# Patient Record
Sex: Female | Born: 1960 | Race: Black or African American | Hispanic: No | Smoking: Former smoker
Health system: Southern US, Community
[De-identification: ages and names within clinical notes are randomized; demographics above are authoritative.]

## PROBLEM LIST (undated history)

## (undated) DIAGNOSIS — R2231 Localized swelling, mass and lump, right upper limb: Secondary | ICD-10-CM

## (undated) DIAGNOSIS — M549 Dorsalgia, unspecified: Secondary | ICD-10-CM

## (undated) DIAGNOSIS — G4730 Sleep apnea, unspecified: Secondary | ICD-10-CM

## (undated) DIAGNOSIS — F329 Major depressive disorder, single episode, unspecified: Secondary | ICD-10-CM

## (undated) DIAGNOSIS — M722 Plantar fascial fibromatosis: Secondary | ICD-10-CM

## (undated) DIAGNOSIS — F32A Depression, unspecified: Secondary | ICD-10-CM

## (undated) DIAGNOSIS — M542 Cervicalgia: Secondary | ICD-10-CM

## (undated) DIAGNOSIS — F419 Anxiety disorder, unspecified: Secondary | ICD-10-CM

## (undated) DIAGNOSIS — I10 Essential (primary) hypertension: Secondary | ICD-10-CM

## (undated) DIAGNOSIS — M25569 Pain in unspecified knee: Secondary | ICD-10-CM

## (undated) DIAGNOSIS — R102 Pelvic and perineal pain: Secondary | ICD-10-CM

## (undated) DIAGNOSIS — N979 Female infertility, unspecified: Secondary | ICD-10-CM

## (undated) DIAGNOSIS — D219 Benign neoplasm of connective and other soft tissue, unspecified: Secondary | ICD-10-CM

## (undated) DIAGNOSIS — M1990 Unspecified osteoarthritis, unspecified site: Secondary | ICD-10-CM

## (undated) HISTORY — DX: Anxiety disorder, unspecified: F41.9

## (undated) HISTORY — DX: Unspecified osteoarthritis, unspecified site: M19.90

## (undated) HISTORY — DX: Benign neoplasm of connective and other soft tissue, unspecified: D21.9

## (undated) HISTORY — PX: HERNIA REPAIR: SHX51

## (undated) HISTORY — DX: Pelvic and perineal pain: R10.2

## (undated) HISTORY — DX: Cervicalgia: M54.2

## (undated) HISTORY — PX: LAPAROSCOPIC LYSIS OF ADHESIONS: SHX5905

## (undated) HISTORY — DX: Sleep apnea, unspecified: G47.30

## (undated) HISTORY — DX: Dorsalgia, unspecified: M54.9

## (undated) HISTORY — PX: ABDOMINAL HYSTERECTOMY: SHX81

## (undated) HISTORY — PX: BREAST SURGERY: SHX581

## (undated) HISTORY — DX: Female infertility, unspecified: N97.9

## (undated) HISTORY — PX: INGUINAL HERNIA REPAIR: SHX194

## (undated) HISTORY — DX: Pain in unspecified knee: M25.569

## (undated) HISTORY — PX: EYE SURGERY: SHX253

---
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
## 1999-03-03 ENCOUNTER — Encounter: Admission: RE | Admit: 1999-03-03 | Discharge: 1999-03-03 | Payer: Self-pay | Admitting: Internal Medicine

## 1999-03-03 ENCOUNTER — Encounter: Payer: Self-pay | Admitting: Internal Medicine

## 1999-12-05 ENCOUNTER — Encounter: Payer: Self-pay | Admitting: Emergency Medicine

## 1999-12-05 ENCOUNTER — Emergency Department (HOSPITAL_COMMUNITY): Admission: EM | Admit: 1999-12-05 | Discharge: 1999-12-05 | Payer: Self-pay | Admitting: Emergency Medicine

## 2001-01-18 ENCOUNTER — Other Ambulatory Visit: Admission: RE | Admit: 2001-01-18 | Discharge: 2001-01-18 | Payer: Self-pay | Admitting: Obstetrics and Gynecology

## 2001-02-01 ENCOUNTER — Ambulatory Visit (HOSPITAL_COMMUNITY): Admission: RE | Admit: 2001-02-01 | Discharge: 2001-02-01 | Payer: Self-pay | Admitting: Internal Medicine

## 2001-02-01 ENCOUNTER — Encounter: Payer: Self-pay | Admitting: Internal Medicine

## 2001-02-21 ENCOUNTER — Encounter: Payer: Self-pay | Admitting: Obstetrics and Gynecology

## 2001-02-21 ENCOUNTER — Encounter: Admission: RE | Admit: 2001-02-21 | Discharge: 2001-02-21 | Payer: Self-pay | Admitting: Obstetrics and Gynecology

## 2001-03-23 ENCOUNTER — Ambulatory Visit: Admission: RE | Admit: 2001-03-23 | Discharge: 2001-03-23 | Payer: Self-pay | Admitting: Gynecology

## 2001-04-15 ENCOUNTER — Encounter (INDEPENDENT_AMBULATORY_CARE_PROVIDER_SITE_OTHER): Payer: Self-pay | Admitting: *Deleted

## 2001-04-15 ENCOUNTER — Ambulatory Visit (HOSPITAL_COMMUNITY): Admission: RE | Admit: 2001-04-15 | Discharge: 2001-04-15 | Payer: Self-pay | Admitting: Obstetrics and Gynecology

## 2001-04-15 HISTORY — PX: CHROMOPERTUBATION: SHX6288

## 2001-04-15 HISTORY — PX: EXCISION OF ENDOMETRIOMA: SHX6473

## 2002-07-19 ENCOUNTER — Other Ambulatory Visit: Admission: RE | Admit: 2002-07-19 | Discharge: 2002-07-19 | Payer: Self-pay | Admitting: Obstetrics and Gynecology

## 2003-04-07 HISTORY — PX: BREAST EXCISIONAL BIOPSY: SUR124

## 2003-07-11 ENCOUNTER — Other Ambulatory Visit: Admission: RE | Admit: 2003-07-11 | Discharge: 2003-07-11 | Payer: Self-pay | Admitting: Obstetrics and Gynecology

## 2003-07-20 ENCOUNTER — Encounter: Admission: RE | Admit: 2003-07-20 | Discharge: 2003-07-20 | Payer: Self-pay | Admitting: Obstetrics and Gynecology

## 2003-07-24 ENCOUNTER — Encounter (INDEPENDENT_AMBULATORY_CARE_PROVIDER_SITE_OTHER): Payer: Self-pay | Admitting: Specialist

## 2003-07-24 ENCOUNTER — Encounter: Admission: RE | Admit: 2003-07-24 | Discharge: 2003-07-24 | Payer: Self-pay | Admitting: Obstetrics and Gynecology

## 2003-08-02 ENCOUNTER — Encounter: Admission: RE | Admit: 2003-08-02 | Discharge: 2003-08-02 | Payer: Self-pay | Admitting: General Surgery

## 2003-08-21 ENCOUNTER — Encounter: Admission: RE | Admit: 2003-08-21 | Discharge: 2003-08-21 | Payer: Self-pay | Admitting: General Surgery

## 2003-08-23 ENCOUNTER — Encounter (INDEPENDENT_AMBULATORY_CARE_PROVIDER_SITE_OTHER): Payer: Self-pay | Admitting: Specialist

## 2003-08-23 ENCOUNTER — Ambulatory Visit (HOSPITAL_BASED_OUTPATIENT_CLINIC_OR_DEPARTMENT_OTHER): Admission: RE | Admit: 2003-08-23 | Discharge: 2003-08-23 | Payer: Self-pay | Admitting: General Surgery

## 2003-08-23 ENCOUNTER — Ambulatory Visit (HOSPITAL_COMMUNITY): Admission: RE | Admit: 2003-08-23 | Discharge: 2003-08-23 | Payer: Self-pay | Admitting: General Surgery

## 2003-08-23 HISTORY — PX: BREAST MASS EXCISION: SHX1267

## 2004-07-30 ENCOUNTER — Encounter: Admission: RE | Admit: 2004-07-30 | Discharge: 2004-07-30 | Payer: Self-pay | Admitting: Internal Medicine

## 2004-08-29 ENCOUNTER — Other Ambulatory Visit: Admission: RE | Admit: 2004-08-29 | Discharge: 2004-08-29 | Payer: Self-pay | Admitting: Obstetrics and Gynecology

## 2005-10-19 ENCOUNTER — Encounter: Admission: RE | Admit: 2005-10-19 | Discharge: 2005-10-19 | Payer: Self-pay | Admitting: Obstetrics and Gynecology

## 2005-10-22 ENCOUNTER — Other Ambulatory Visit: Admission: RE | Admit: 2005-10-22 | Discharge: 2005-10-22 | Payer: Self-pay | Admitting: Obstetrics and Gynecology

## 2005-10-29 ENCOUNTER — Encounter: Admission: RE | Admit: 2005-10-29 | Discharge: 2005-10-29 | Payer: Self-pay | Admitting: Obstetrics and Gynecology

## 2006-11-22 ENCOUNTER — Encounter: Admission: RE | Admit: 2006-11-22 | Discharge: 2006-11-22 | Payer: Self-pay | Admitting: Internal Medicine

## 2006-12-14 ENCOUNTER — Emergency Department (HOSPITAL_COMMUNITY): Admission: EM | Admit: 2006-12-14 | Discharge: 2006-12-14 | Payer: Self-pay | Admitting: *Deleted

## 2007-01-06 ENCOUNTER — Encounter: Admission: RE | Admit: 2007-01-06 | Discharge: 2007-01-06 | Payer: Self-pay | Admitting: Internal Medicine

## 2007-01-06 ENCOUNTER — Inpatient Hospital Stay (HOSPITAL_COMMUNITY): Admission: AD | Admit: 2007-01-06 | Discharge: 2007-01-07 | Payer: Self-pay | Admitting: Obstetrics and Gynecology

## 2007-04-14 ENCOUNTER — Emergency Department (HOSPITAL_COMMUNITY): Admission: EM | Admit: 2007-04-14 | Discharge: 2007-04-14 | Payer: Self-pay | Admitting: Family Medicine

## 2007-05-24 ENCOUNTER — Ambulatory Visit (HOSPITAL_COMMUNITY): Admission: RE | Admit: 2007-05-24 | Discharge: 2007-05-25 | Payer: Self-pay | Admitting: Obstetrics and Gynecology

## 2007-05-24 ENCOUNTER — Encounter (INDEPENDENT_AMBULATORY_CARE_PROVIDER_SITE_OTHER): Payer: Self-pay | Admitting: Obstetrics and Gynecology

## 2007-05-24 HISTORY — PX: ROBOTIC ASSISTED TOTAL HYSTERECTOMY WITH BILATERAL SALPINGO OOPHERECTOMY: SHX6086

## 2007-06-18 ENCOUNTER — Ambulatory Visit (HOSPITAL_COMMUNITY): Admission: AD | Admit: 2007-06-18 | Discharge: 2007-06-18 | Payer: Self-pay | Admitting: Obstetrics and Gynecology

## 2007-06-18 HISTORY — PX: REPAIR VAGINAL CUFF: SHX6067

## 2007-11-24 ENCOUNTER — Ambulatory Visit (HOSPITAL_COMMUNITY): Admission: RE | Admit: 2007-11-24 | Discharge: 2007-11-24 | Payer: Self-pay | Admitting: Internal Medicine

## 2008-08-08 ENCOUNTER — Emergency Department (HOSPITAL_COMMUNITY): Admission: EM | Admit: 2008-08-08 | Discharge: 2008-08-08 | Payer: Self-pay | Admitting: Emergency Medicine

## 2008-11-27 ENCOUNTER — Ambulatory Visit (HOSPITAL_COMMUNITY): Admission: RE | Admit: 2008-11-27 | Discharge: 2008-11-27 | Payer: Self-pay | Admitting: Internal Medicine

## 2009-11-29 ENCOUNTER — Ambulatory Visit (HOSPITAL_COMMUNITY): Admission: RE | Admit: 2009-11-29 | Discharge: 2009-11-29 | Payer: Self-pay | Admitting: Obstetrics and Gynecology

## 2010-07-15 LAB — POCT URINALYSIS DIP (DEVICE)
Bilirubin Urine: NEGATIVE
Glucose, UA: NEGATIVE mg/dL
Ketones, ur: NEGATIVE mg/dL
Nitrite: NEGATIVE
Protein, ur: NEGATIVE mg/dL
Specific Gravity, Urine: 1.01 (ref 1.005–1.030)
Urobilinogen, UA: 0.2 mg/dL (ref 0.0–1.0)
pH: 5.5 (ref 5.0–8.0)

## 2010-08-19 NOTE — Op Note (Signed)
[name], Christine Eaton              ACCOUNT NO.:  [id number]   MEDICAL RECORD NO.:  [id number]          PATIENT TYPE:  AMB   LOCATION:  MATC                          FACILITY:  [facility]   PHYSICIAN:  Hal Morales, M.D.DATE OF BIRTH:  09-26-1960   DATE OF PROCEDURE:  06/18/2007  DATE OF DISCHARGE:                               OPERATIVE REPORT   PREOPERATIVE DIAGNOSIS:  Vaginal bleeding status post robotic-assisted  hysterectomy, bilateral salpingo-oophorectomy, and lysis of adhesions on  May 24, 2007, hemodynamic instability.   POSTOPERATIVE DIAGNOSIS:  Vaginal mucosal cuff separation.   PROCEDURE:  Repair of vaginal cuff separation.   SURGEON:  Hal Morales, M.D.   ANESTHESIA:  General orotracheal.   ESTIMATED BLOOD LOSS:  Less than 10 mL at surgery; however, the patient  had lost approximately 500 mL of blood as a result of her bleeding prior  to reaching the operating room.   SPECIMENS TO PATHOLOGY:  None.   FINDINGS:  The vaginal mucosa had separated at the apex of the vaginal  cuff, leaving a row of Vicryl sutures in the submucosal tissue but  having the vaginal mucosa completely separated the anterior from  posterior.  In the left angle of the vaginal cuff, there were 3 distinct  areas of vascular bleeding.   PROCEDURE:  The patient was taken to the operating room after  appropriate identification and after an unsuccessful attempt at complete  examination without anesthesia.  She was placed on the operating table,  and after the attainment of adequate general anesthesia was placed in  the lithotomy position.  The perineum and vagina were prepped with  multiple layers of Betadine and draped as a sterile field.  A Foley  catheter was inserted into the bladder and connected to straight  drainage.   A weighted speculum was placed in the posterior vagina and sidewall  retractors used to retract the sidewall.  A Deaver was used to retract  the bladder.  The  above-noted findings were made.  The vaginal cuff  edges were noted to be smooth and nonnecrotic.  The vaginal cuff was  then reapproximated with sutures of 0 Vicryl in a figure-of-eight  fashion, allowing for excellent hemostasis at the end of the procedure.  All instruments were then removed from the vagina.   The patient was awakened from general anesthesia and taken to the  recovery room in satisfactory condition, having tolerated the procedure  well, with sponge and instrument counts correct.   DISCHARGE INSTRUCTIONS:  Printed instructions from the [facility]  of [place] modified for repair of vaginal cuff.   DISCHARGE MEDICATIONS:  The patient's preoperative medications which  included amoxicillin 500 mg t.i.d. to complete 10 days, triamterene HCTZ  37.5/25, and ibuprofen 600 mg p.o. q.6h. p.r.n. pain.   FOLLOW UP:  The patient is to follow up with Dr. Estanislado Pandy in 2 weeks as  scheduled.  She is to call for any further heavy vaginal bleeding,  severe abdominal pain or cramps, elevation of temperature to greater  than 101.  She is cautioned that she may have some vaginal spotting but  that she should not have any vaginal bleeding that is as heavy as her  formal menstrual periods.  She will be observed in the postanesthesia  care unit, and if she has no orthostatic symptoms will be allowed to be  discharged home.      Hal Morales, M.D.  Electronically Signed     VPH/MEDQ  D:  06/18/2007  T:  06/19/2007  Job:  [id number]   cc:   Dois Davenport A. Rivard, M.D.  Fax: [phone]

## 2010-08-19 NOTE — Op Note (Signed)
NAMEEMMALISE, Christine Eaton              ACCOUNT NO.:  [id number]   MEDICAL RECORD NO.:  [id number]          PATIENT TYPE:  OIB   LOCATION:  1537                         FACILITY:  [facility]   PHYSICIAN:  Crist Fat. Rivard, M.D. DATE OF BIRTH:  1960-09-25   DATE OF PROCEDURE:  05/24/2007  DATE OF DISCHARGE:                               OPERATIVE REPORT   PREOPERATIVE DIAGNOSES:  1. Chronic pelvic pain.  2. Stage IV endometriosis.  3. Right ovarian fibroma.  4. Uterine fibroids.  5. Right ovarian endometrioma.  6. Dense pelvic adhesions with frozen pelvis.   ANESTHESIA:  General, Jenelle Mages. Rica Mast, M.D.   PROCEDURE:  Robotic-assisted total hysterectomy with bilateral salpingo-  oophorectomy and lysis of adhesions.   SURGEON:  Crist Fat. Rivard, M.D.   ASSISTANTBritt Bottom C. Lowell Guitar, M.D.   ESTIMATED BLOOD LOSS:  200 mL.   PROCEDURE:  After being informed of the planned procedure with possible  complications including bleeding, infection, injury to bowels, bladder  or ureters, need for laparotomy, need to proceed with a supracervical  hysterectomy instead of a total hysterectomy, informed consent is  obtained.  The patient is taken to OR #10, given general anesthesia with  endotracheal intubation without complication.  She is placed in a  lithotomy position on a sticky mattress, arms padded and tucked on each  side, chest taped to the table and knee-high sequential compression  devices on.  She is prepped and draped in a sterile fashion.  Her pelvic  exam reveals a midline uterus which appears to be mobile.  The adnexa  are not felt.  A weighted speculum is inserted.  Anterior lip of the  cervix was grasped with a tenaculum forceps and we proceed with cervical  dilatation until Hegar dilator #27.  The uterus is then sounded at 7 cm,  which allows for the easy entry of a #6 RUMI intrauterine manipulator.  This is put in place with the 3-mm co-ring and a vaginal occluder.  The  RUMI  uterine manipulator balloon is inflated with saline 3 mL.   The co-ring is sutured to the cervix with two sutures of 0 Vicryl.   A Foley catheter is then placed.   A decision is made to place our camera trocar 2 cm above the umbilicus,  and this area is infiltrated with Marcaine 0.25% L and we perform a  midline incision on a 10-mm distance.  This was brought down bluntly to  the fascia,which is identified, grasped with Kocher forceps and incised  in a midline fashion.  Peritoneum is entered bluntly.  A pursestring  suture of 0 Vicryl is placed on the fascia and we can easily insert a 10-  mm Hasson trocar held in place by the pursestring suture.  This allows  [place] for easy insufflation of a pneumoperitoneum with CO2 at a maximum  pressure of 15 mmHg.   The camera is inserted for a quick evaluation of the pelvis, which  reveals a normal appendix and a normal liver as well as a normal  gallbladder.  The pelvis is visualized with difficulty.  What  we can see  right now is a large right ovarian pelvic mass with a smooth surface,  from what we can see, compatible with an endometrioma.  We can only see  the fundus of the uterus, which shows two small subserosal fibroids  between 2.5 and 3 cm.  The anterior cul-de-sac is negative.  We try to  manipulate the uterus with the previously-placed RUMI.  It is impossible  for [place] to see more than what is previously described.   We then decide on placement of our other trocars and we place two 8-mm  robotic trocars on the left side after infiltrating with Marcaine 0.25%  and this is performed under direct visualization.  We place a one 8-mm  robotic trocar on the right side as well as a 10-mm patient-side  assistant trocar on the right side under direct visualization after  infiltrating with Marcaine 0.25%.   The robot is now easily docked and the patient is placed in a steep  Trendelenburg position.  Preparation, including docking time, takes 45   minutes.   Console time starts at the 11:35 and once we have placed a Cobra forceps  in arm #3, a PK gyrus forceps in arm #2, and a hot monopolar scissors in  arm #1, with the help of the intrauterine manipulator we can now  visualize the pelvis.  This pelvis is what we would consider a frozen  pelvis since we are unable to mobilize the right ovary, which appears to  be approximately 8 cm in diameter.  It is impossible to visualize the  posterior cul-de-sac with the rectum completely adherent to the midbody  of the posterior aspect of the uterus.  We are unable to visualize the  left adnexa at all.  We are able to visualize the left round ligament  and the right round ligament.  A decision is made to proceed  systematically and the right round ligament is grasped and cauterized  with PK forceps and sectioned.  This allows [place] an entry in the anterior  broad ligament all the way to the anterior cul-de-sac.  We are able to  mobilize the bladder easily. And with the round ligament entry, we can  now open the right pelvic sidewall retroperitoneum and dissect the space  and bluntly and sharply.  The infundibulopelvic ligament is eventually  identified and, luckily, we can see the ureter away from it.  The IP  ligament is then grasped, cauterized with PK forceps and sectioned.  Then we spent the next 90 minutes dissecting the ovary systematically,  keeping the ureter under direct visualization.  Because of the  thickening of the peritoneum and the inflammatory aspect, this  dissection is quite challenging.  Nevertheless, we are able to dissect  the ovary from the pelvic wall about 75% of the way and then we can turn  our attention to the utero-ovarian ligament.  Sharply and bluntly  dissecting the rectum away from the mid body of the uterus, we have  access to the utero-ovarian ligament.  This one is grasped, cauterized  and sectioned, and we proceed systematically until the ovary is   completely freed from the uterus and from the pelvic wall, having kept a  close eye on the ureter.  During this dissection the ovary is ruptured  and chocolaty-aspect fluid is evacuated, typical of endometriosis.  Please note that before the dissection took place, pelvic washings were  obtained.  There is no active bleeding and by dissecting the  bladder  even further down, we are able to locate the uterine artery in its  ascending branch near the co-ring, which is, thankfully, easily  identified in the anterior aspect of the vagina.  We then place our  attention on the left pelvic wall and after we have bluntly and sharply  dissected the rectum away from the mid body of the uterus, we actually  have a very good visualization of the left ureter.  The  infundibulopelvic ligament is easily identified away from the ureter.  It is grasped, cauterized with PK forceps and sectioned.  The round  ligament on the left side is cauterized and sectioned and the  retroperitoneum again is entered.  The broad ligament on the left side  is opened anteriorly and we can complete the dissection of the bladder  away from the vaginal fornix by easily identifying the co-ring.  The  left ovary and left tube are then identified completely adherent to the  pelvic wall and to the uterus but, thankfully, it can be dissected  bluntly by traction-countertraction until it is completely released from  the left pelvic wall.  We do have to put more attention on the posterior  cul-de-sac and dissect the rectum somewhat more downward so that the co-  ring can be easily identified despite the inflammatory nature of the  posterior cul-de-sac, and this is achieved fairly easily with blunt  dissection.  Going back into the anterior aspect of the vaginal fornix,  we can now easily identify on both sides the uterine artery in its  ascending aspect.  With the ureter away from our site, we can then  cauterize both uterine arteries  very safely.  We then insufflate the  vaginal occluder and we enter the vagina in the anterior aspect guided  by the co-ring, and we perform a circumferential incision, freeing the  uterus entirely.  The uterus is then removed completely through the  vagina with its left adnexa, and the right adnexa is also pushed through  the vagina and removed.   At this time bleeding is noted on the right vaginal artery, which is  easily identified, grasped and cauterized.   We irrigate profusely the pelvis and we do not note any active bleeding.  The instruments are then changed for a suture cut in arm #1, a needle  holder in arm #2, and PK forceps in arm #3, and we proceed with closure  of the vagina using figure-of-eight stitches of 0 Vicryl.  Once the  vagina is completely closed, we proceed with systematic and profuse  irrigation of the pelvis, noting a satisfactory hemostasis on the right  pelvic wall with a normal ureter, normal mobility, no dilatation.  On  the left pelvic wall there are a few oozing sites that are controlled  with cauterization after ensuring that the ureter is away from those  sites.  The posterior cul-de-sac at the site of dissection away from the  rectum is oozing on different sides, and this is controlled with  cauterization.  We again irrigate profusely and now note a satisfactory  hemostasis.  We again review the right ureter, which is normal, and the  left ureter, which is normal.  An Interceed is split into lengthwise and  deposited on the left and right pelvic wall as well as the vaginal  fornix.   The robot is undocked.  Trocars are removed under direct sedation after  evacuating pneumoperitoneum.   The fascia of the supraumbilical incision  is closed with the previously-  placed suture of 0 Vicryl.  The fascia of the right 10-mm patient side-  assistant trocar is closed with a figure-of-eight stitch of 0 Vicryl,  and then the skin of all incisions is closed with  subcuticular suture of  3-0 Monocryl and Dermabond.   Instrument and sponge count is complete x2.  Estimated blood loss is 200  mL.  The procedure is very well-tolerated by the patient, who is taken  to recovery room in a well and stable condition.   The specimen is composed of uterus, two tubes, two ovaries and cervix,  weighs 198 g, and is sent to pathology.      Crist Fat Rivard, M.D.  Electronically Signed     SAR/MEDQ  D:  05/24/2007  T:  05/25/2007  Job:  [id number]

## 2010-08-19 NOTE — H&P (Signed)
NAMEJACKILYN, Christine Eaton              ACCOUNT NO.:  [id number]   MEDICAL RECORD NO.:  [id number]          PATIENT TYPE:  AMB   LOCATION:  DAY                          [facility]ACILITY:  [facility]   PHYSICIAN:  Crist Fat. Rivard, M.D. DATE OF BIRTH:  May 20, 1960   DATE OF ADMISSION:  DATE OF DISCHARGE:                              HISTORY & PHYSICAL   HISTORY OF PRESENT ILLNESS:  Christine Eaton is a 50 year old widowed African-  American female para, 0-0-3-0, who presents for A robot assisted  laparoscopic hysterectomy with bilateral salpingo-oophorectomy because  of chronic pelvic pain, stage IV endometriosis, uterine fibroids and a  right ovarian fibroma.  Christine Eaton has a longstanding history of chronic  pelvic pain which she reports has worsened over the past 2 years.  The  patient has a 7-10-day menstrual flow which is characterized by severe  pain which she rates as an 8/10 on a 10-point pain scale and decreases  only to 3/10 on a 10-point pain scale with narcotic analgesia.  She  changes her overnight size pads every 3 hours and occasionally will soil  her clothes.  She denies any urinary tract symptoms, vaginitis symptoms,  changes in her bowel movements or fever.  The patient was seen at [facility] in October of 2008 for severe abdominal pain and  a subsequent pelvic ultrasound showing a complex left ovarian cyst which  was septated, measuring 4.3 cm, and an accompanying hemorrhagic cyst  measuring 3.1 cm.  Additionally, a complex right adnexal mass appearing  lateral to the right ovary measuring 6.6 x 5.5 x 4.9 cm was also  observed.  Accompanying these findings were 3 uterine fibroids with the  largest measuring 3.4 cm.  A subsequent pelvic CT had consistent  findings, except only one fibroid measuring 2.9 cm was observed.  It was  at this time that the patient was placed on 73-month course of Lupron  11.25 mg IM in November of 2008, and fortunately had significant but not  total relief of her pain.  The patient was amenorrheic throughout the  course of her Lupron therapy, with the exception of a 3-day flow in  December of 2008 which she says was very light.  A gonorrhea and  chlamydia culture done during this time was negative.  A complete blood  count was within normal limits, and her CA-125 was elevated at 165.5.  The patient experienced a follow-up ultrasound in February of 2009 which  revealed a uterus measuring 7.49 cm x 5.83 cm x 5.59 cm with 3 fibroids,  the largest of which measured 3.42 cm.  These findings were accompanied  only by a right ovarian fibroma measuring 7.0 cm x 6.4 cm x 5.0 cm.  Given the chronicity and debilitating nature of her symptoms, along with  the relief he achieved with Lupron therapy, the patient has decided to  proceed with definitive therapy for her condition in the form of  hysterectomy with bilateral salpingo-oophorectomy.   PAST MEDICAL HISTORY:   OBSTETRICAL HISTORY:  Gravida 3, para 0-0-3-0.   GYNECOLOGIC HISTORY:  Menarche at 50 years old.  Last menstrual period  was January 18, 2007 due to Lupron therapy.  The patient does have a  history of syphilis and herpes simplex virus #2.  Denies any history of  abnormal Pap smears.  Last normal Pap smear was in 2007.  Last normal  mammogram was in 2008.   MEDICAL HISTORY:  1. Hypertension.  2. Infertility.   SURGICAL HISTORY:  1. 1980 - elective abortion.  2. 1985 - D&C due to a spontaneous abortion.  3. 1989 - hernia repair.  4. 2003 - diagnostic laparoscopy with lysis of adhesions and excision      of an endometrioma (it was at this time the patient was diagnosed      with severe endometriosis).  5. 2005 - right breast biopsy (benign).  (The patient denies any problems with anesthesia or history of blood  transfusions.)   FAMILY HISTORY:  Hypertension, diabetes and stroke.   HABITS:  The patient does not use tobacco.  She occasionally consumes  alcohol.   Denies any use of illicit drugs.   SOCIAL HISTORY:  The patient is widowed and works as a Water quality scientist at  [employer].   CURRENT MEDICATIONS:  1. Calcium 1000 mg daily.  2. Multivitamin 1 tablet daily.  3. Triamterene/HCTZ 37.5/25 mg 1 tablet daily.   ALLERGIES:  She has no known drug allergies.   REVIEW OF SYSTEMS:  The patient denies any chest pain, shortness of  breath, headache, vision changes, and except as is mentioned in history  of present illness, the patient's review of systems is negative.   PHYSICAL EXAMINATION:  VITAL SIGNS:  Blood pressure 112/80.  Weight is  203, height 5 feet 8 inches tall, pulse is 82.  NECK:  Supple without masses.  There is no thyromegaly or cervical  adenopathy.  HEART:  Regular rate and rhythm.  LUNGS:  Clear.  BACK:  No CVA tenderness.  ABDOMEN:  Diffusely tender with no guarding or rebound.  There are no  masses or organomegaly.  EXTREMITIES:  No clubbing, cyanosis or edema.  PELVIC:  EG/BUS is within normal limits.  Vagina is rugous.  Cervix is  nontender without lesions, though it is stenotic.  Cervix appears normal  size, shape and consistency.  It is mobile, anteverted.  Adnexa with no  palpable masses; however, there is right adnexal tenderness.  Rectovaginal reveals no masses.   IMPRESSION:  1. Chronic pelvic pain.  2. Stage IV endometriosis.  3. Uterine fibroids.  4. Right ovarian fibroma.   DISPOSITION:  A discussion was held with the patient regarding the  indications for her procedures along with their risks which include, but  are not limited to, reaction to anesthesia, damage to adjacent organs,  infection, excessive bleeding, increased length of procedure compared  with open abdominal case and transient postoperative facial edema.  The  patient further understands that her length of stay in the hospital is  expected to be 1-2 days and that she should be able to resume her normal  activities within 2-3 weeks.   The patient was given a MiraLax bowel prep  to be  administered 24 hours prior to her surgical procedure.  She has  consented to proceed with a robot assisted laparoscopic hysterectomy  with bilateral salpingo-oophorectomy at [facility]  on May 24, 2007 at 10:30 a.m.      Elmira J. Adline Peals.      Crist Fat Rivard, M.D.  Electronically Signed    EJP/MEDQ  D:  05/21/2007  T:  05/23/2007  Job:  [id number]

## 2010-08-22 NOTE — Op Note (Signed)
NAME:  Christine Eaton, Christine Eaton                        ACCOUNT NO.:  [id number]   MEDICAL RECORD NO.:  [id number]                   PATIENT TYPE:  AMB   LOCATION:  DSC                                  FACILITY:  [facility]   PHYSICIAN:  Leonie Man, M.D.                DATE OF BIRTH:  01-21-1961   DATE OF PROCEDURE:  08/23/2003  DATE OF DISCHARGE:                                 OPERATIVE REPORT   PREOPERATIVE DIAGNOSIS:  Right breast mass.   POSTOPERATIVE DIAGNOSIS:  Right breast mass.  Pathology pending.   OPERATION PERFORMED:  Excisional biopsy, right breast mass.   SURGEON:  Leonie Man, M.D.   ASSISTANT:  Nurse.   ANESTHESIA:  General.   INDICATIONS FOR PROCEDURE:  The patient is a 49 year old woman with a right-  sided breast mass located at 2 o'clock axis of the breast.  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  She comes to  the operating room now for excision of this mass after the risks and  potential benefits of surgery had been discussed, all questions answered and  consent obtained.   DESCRIPTION OF PROCEDURE:  Following the induction of satisfactory general  anesthesia, the patient was positioned supinely and the right breast prepped  and draped routinely.  The mass was palpated in the upper inner quadrant of  the right breast at approximately 2 o'clock axis.  A transverse incision was  made over the mass, deepened through the skin and subcutaneous tissue  carrying the dissection down to the mass and in fact, upon dissection around  this area, there was one somewhat softer mass which was excised from the  surrounding soft tissue and removed and forwarded for pathologic evaluation  as specimen #1.  Right below this was yet another mass which was clinically  a fibroadenoma and this was dissected free from the surrounding tissues and  also forwarded for pathologic evaluation as specimen #2. There were no  additional masses felt within  the breast by manual examination of the breast  tissues.  Hemostasis was assured with electrocautery.  The breast tissues  were reapproximated with interrupted 2-0 Vicryl sutures.  The subcutaneous  tissues closed with interrupted 3-0 Vicryl sutures and the skin closed with  a running 5-0 Monocryl suture.  The wound was then reinforced with Steri-  Strips.  Sterile dressings applied.  Anesthetic was reversed and the patient  removed from the operating room to the recovery room in stable condition.  She tolerated the procedure well.                                               Leonie Man, M.D.    PB/MEDQ  D:  08/23/2003  T:  08/24/2003  Job:  [id number]

## 2010-08-22 NOTE — Consult Note (Signed)
[facility]  Patient:    Christine Eaton, Christine Eaton Visit Number: [id number] MRN: [id number]          Service Type: GON Location: GYN Attending Physician:  Jeannette Corpus Dictated by:   Rande Brunt. Clarke-Pearson, M.D. Proc. Date: 03/23/01 Admit Date:  03/23/2001   CC:         Janine Limbo, M.D.   Consultation Report  REASON FOR CONSULTATION:  A 50 year old married African-American female referred by Dr. Marline Backbone for second opinion regarding management of a newly diagnosed adnexal mass.  The patient presented to Dr. Stefano Gaul in early November, complaining of right lower quadrant pain.  She was found to have a pelvic mass which was typified on an ultrasound as a 6.4 x 4.6 x 5.7 cm ovarian cyst.  At that time, a CA125 was drawn which was 63.  Subsequently, the patient has had a CAT scan which shows no other significant abnormalities except for the right adnexal mass.  A repeat CA125 on March 02, 2001, was 49.5.  The patient has persistent right lower quadrant pain.  She actually reports that this has been going on for 3 to 4 months, and it is also associated with dyspareunia.  From a gynecologic point of view, the patient has regular cyclic menstrual periods.  She has never been pregnant, although she has been trying to get pregnant for the last 7 years.  PAST MEDICAL HISTORY:  Hypertension.  ALLERGIES:  No known drug allergies.  CURRENT MEDICATIONS:  Norvasc.  PAST SURGICAL HISTORY:  Femoral hernia repair in 1989.  FAMILY HISTORY:  Negative for gynecologic, breast, or colon cancer.  REVIEW OF SYSTEMS:  Negative, except as noted in history of present illness.  PHYSICAL EXAMINATION:  VITAL SIGNS:  Height 5 feet 7 inches, weight 191 pounds.  GENERAL:  The patient is a healthy pleasant black female in no acute distress.  HEENT:  Negative.  NECK:  Supple, without thyromegaly.  There is no supraclavicular or  inguinal adenopathy.  ABDOMEN:  Soft, nontender, except for some tenderness to deep palpation of the right lower quadrant.  No masses are noted.  PELVIC:  EGBUS normal.  Vagina clean.  Cervix is normal.  Uterus feels to be retroverted, and there is fullness in a very tender area to the right of the uterus consistent with an adnexal mass.  Rectovaginal examination confirms.  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  The slightly elevated CA125 is most likely secondary to the patient having endometriosis.  I seriously doubt that this is an ovarian malignancy, and would therefore recommend that Dr. Stefano Gaul proceed with surgical management, either by laparotomy or laparoscopy.  I would be happy to be on standby, but reassured the patient and her husband that I thought it unlikely that this was an ovarian cancer, and that it is more likely that she has an endometrioma.Dictated by:   Rande Brunt. Clarke-Pearson, M.D. Attending Physician:  Jeannette Corpus DD:  03/23/01 TD:  03/23/01 Job: [id number]

## 2010-08-22 NOTE — Op Note (Signed)
[facility]  Patient:    Christine Eaton, Christine Eaton Visit Number: [id number] MRN: [id number]          Service Type: DSU Location: [facility] Attending Physician:  Leonard Schwartz Dictated by:   Janine Limbo, M.D. Proc. Date: 04/15/01 Admit Date:  04/15/2001 Discharge Date: 04/15/2001                             Operative Report  PREOPERATIVE DIAGNOSES:       1. A 6.7 cm complex right adnexal mass.                               2. Elevated CA-125 (but decreasing values).                               3. Infertility.  POSTOPERATIVE DIAGNOSES:      1. A 6.7 cm complex right adnexal mass.                               2. Elevated CA-125 (but decreasing values).                               3. Infertility.                               4. Severe endometriosis.                               5. Severe pelvic adhesions.  PROCEDURES:                   1. Diagnostic laparoscopy.                               2. Laparoscopic lysis of adhesions.                               3. Laparoscopic pelvic biopsies.                               4. Chromopertubation.                               5. Laparoscopic evacuation of an endometrioma in                                  the right tube.  SURGEON:                      Janine Limbo, M.D.  ANESTHESIA:                   General.  INDICATIONS:                  The patient is a 50 year old female, para 0-0-3-0, who presents with the above-mentioned diagnoses.  She understands the indications for her surgical procedure and she accepts the risks of (but not limited to) anesthetic complications, bleeding, infection and possible damage to the surrounding organs.  She understands that no guarantees can be given concerning the total relief of her pain.  She understands that it is possible that we may need to perform a salpingo-oophorectomy.  FINDINGS:                     The uterus was upper limits of normal  size. There was severe endometriosis throughout the pelvis and the abdominal cavity. The posterior cul-de-sac had severe endometriosis and adhesions.  There were endometriosis lesions measuring greater than 1 cm in size.  The right fallopian tube was approximately 7 cm in size and it contained fluid and dark brown to red tissue consistent with endometriosis in the tube.  The right ovary was upper limits of normal size and it was severely adhered to the right pelvic side wall, the right fallopian tube, the right fundus of the uterus and the right posterior cul-de-sac.  The left ovary was normal size.  It had dense adhesions between the left pelvic side wall and the left posterior cul-de-sac. The left fallopian tube was surprisingly normal.  The fimbriated end of the left tube was delicate.  On chromopertubation, the tube was noted to fill with dye and dye did spill into the pelvic cavity.  No dye was able to spill fro the right fallopian tube, however.  There were endometriosis lesions present in the anterior cul-de-sac, with the largest measuring approximately 1 cm in size.  The posterior cul-de-sac contained moderate adhesions.  The appendix had multiple endometriosis lesions present on it measuring less than 0.5 cm in size.  The bowel and the omentum had multiple endometriosis lesions on it in the pelvis.  These lesions measured less than 0.5 cm in size.  There was a flesh-appearing lesion measuring 1 cm on the diaphragm to the right of the midline.  This was not consistent with endometriosis.  The liver and the gallbladder appeared normal.  DESCRIPTION OF PROCEDURE:     The patient was taken to the operating room, where a general anesthetic was given.  The patients abdomen, perineum and vagina were prepped with multiple layers of Betadine.  A Foley catheter was placed in the bladder.  The patient was sterilely draped after a Hulka tenaculum was placed inside the uterus.  The  subumbilical area was injected with 5 cc of 0.5% Marcaine.  An incision was made and a Veress needle was inserted into the abdominal cavity without difficulty.  Proper placement was confirmed using the saline drop test.  A pneumoperitoneum was obtained.  The laparoscopic trocar and the laparoscope were then inserted.  The pelvic structures were visualized with findings as mentioned above.  Two suprapubic incisions were made after injecting the areas with a total of 5 cc of 0.5% Marcaine.  Two 5 mm trocars were placed into the lower cavity under direct visualization.  Pictures were taken of the patients pelvic anatomy.  The pelvis was irrigated.  Lysis of adhesions was accomplished in the right adnexa, though we were not able to totally remove all of the adhesions and the right ovary remained stuck, even after lysis of adhesions.  We did open the right fallopian tube and drain the endometrioma that was present.  Biopsies were obtained from lesions in the pelvis.  We carefully considered what we were likely to accomplish with our  operative procedure, and the decision was made to terminate the procedure because we felt that we were not able to adequately lyse the adhesions because of the severity of the disease.  We did biopsy the lesion on the diaphragm.  Chromopertubation was performed and dye was noted to quickly fill and spill from the left tube.  Dye did not spill fro the right tube.  The pelvis was again irrigated.  Hemostasis was noted to be adequate.  The decision was made to terminate the procedure at this point. The bowel was carefully inspected and there was no evidence of trocar damage. The pneumoperitoneum was allowed to escape.  All instruments were removed. The incisions were closed using deep and superficial sutures of 4-0 Vicryl. Sponge, needle and instrument counts were correct.  Estimated blood loss was 10 cc.  The patient tolerated her procedure well.  She was taken to  the recovery room in stable condition.  FOLLOW-UP INSTRUCTIONS:       The patient was given Tylenol #3, 1-2 p.o. q.4h. p.r.n. pain.  She will return to see Dr. Stefano Gaul in 2-3 weeks for follow-up  examination.  She was given a copy of the postoperative instruction sheet as prepared by the [facility] or [facility] for patients who have undergone laparoscopy.  We will plan Lupron therapy and more extensive surgery via laparotomy at a later time. Dictated by:   Janine Limbo, M.D. Attending Physician:  Leonard Schwartz DD:  04/16/01 TD:  04/17/01 Job: [id number]

## 2010-08-22 NOTE — H&P (Signed)
[facility]  Patient:    Christine Eaton, Christine Eaton Visit Number: [id number] MRN: [id number]          Service Type: GON Location: GYN Attending Physician:  Jeannette Corpus Dictated by:   Janine Limbo, M.D. Admit Date:  03/23/2001 Discharge Date: 03/23/2001   CC:         Dr. Valentina Eaton, Christine Eaton Medical   History and Physical  HISTORY OF PRESENT ILLNESS:   Christine Eaton is a 50 year old female, para 0-0-3-0, who presents for diagnostic laparoscopy and possible right salpingo-oophorectomy.  The patient was seen for the first time in October 2002.  She was thought to have a fibroid uterus.  An ultrasound was obtained that showed an 8.6 x 4.8 cm uterus, but a cyst on the right ovary measuring 6.4 x 5.7 that was thought to be complex.  A CA125 was ordered, and it was 63.1 (21 or less is normal).  A repeat CA125 was 49.5.  The patient then had a CT scan of the pelvis and the abdomen performed, and again there was noted to be a cystic lesion in the right adnexa, but there was no evidence of malignancy.  The patient was seen by a GYN oncologist, and it was their opinion that nothing more needed to be done.  The patient was not comfortable with that, however, and wants to proceed with diagnostic laparoscopy and possible oophorectomy for more definitive diagnosis.  The patient has a history of anemia, although her hemoglobin was 13.7 in November 2002.  She has had a period that was slightly longer than prior periods.  She is interested in getting pregnant if at all possible.  The patient does have a past history of trichomoniasis, herpes simplex virus, and syphilis.  She has had D&Cs in the past but denies other GYN surgeries.  Her GC and chlamydia cultures were negative in October 2002.  The patients most recent Pap smear was in October 2002, and it was within normal limits.  PAST MEDICAL HISTORY:         The patient has hypertension, and she has  had her wisdom teeth removed.  She did have a hernia repair.  CURRENT MEDICATIONS:          1. Valtrex 500 mg p.o. b.i.d. for 5 days as                                  needed.                               2. Norvasc 10 mg each day.  DRUG ALLERGIES:               No known drug allergies.  SOCIAL HISTORY:               The patient smokes one-half pack of cigarettes each day.  She drinks alcohol socially.  She denies recreational drug use.  REVIEW OF SYSTEMS:            Noncontributory except for intestinal problems.  FAMILY HISTORY:               The patient has a family history of hypertension, diabetes, and strokes.  PHYSICAL EXAMINATION:  VITAL SIGNS:                  Weight 191 pounds.  HEENT:                        Within normal limits.  CHEST:                        Clear.  HEART:                        Regular rate and rhythm.  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  BACK:                         No CVA tenderness.  ABDOMEN:                      No masses, tenderness, or organomegaly.  EXTREMITIES:                  No clubbing, cyanosis, or edema.  NEUROLOGIC:                   Grossly normal.  PELVIC:                       External genitalia is normal.  Vagina is normal. The cervix is nontender, and no lesions are present.  The uterus is approximately 10-week size and is tender posteriorly.  Adnexa: No masses could be appreciated on exam, although I did think that she had fibroids. Rectovaginal exam confirms.  ASSESSMENT:                   1. Complex mass in the right adnexa.                               2. Elevated CA125 but decreasing.                               3. Cigarette smoker.                               4. Infertility and desire for childbearing.  PLAN:                         The patient will undergo a  diagnostic laparoscopy and possible right salpingo-oophorectomy.  She will also undergo a chromopertubation.  The patient understands the indications for her procedure, and she accepts the risks of, but not limited to, anesthetic complications, bleeding, infections, and possible damage to the surrounding organs. Dictated by:   Janine Limbo, M.D. Attending Physician:  Jeannette Corpus DD:  04/07/01 TD:  04/07/01 Job: [id number]

## 2010-08-22 NOTE — Op Note (Signed)
[facility]  Patient:    Christine Eaton, Christine Eaton Visit Number: [id number] MRN: [id number]          Service Type: DSU Location: [facility] Attending Physician:  Leonard Schwartz Dictated by:   Janine Limbo, M.D. Proc. Date: 04/15/01 Admit Date:  04/15/2001 Discharge Date: 04/15/2001   CC:         Dr. Valentina Lucks, [facility].   Operative Report  PREOPERATIVE DIAGNOSES:       1. A 6.7 cm complex right adnexal mass.                               2. Elevated CA-125 (but decreasing values).                               3. Infertility.  POSTOPERATIVE DIAGNOSES:      1. A 6.7 cm complex right adnexal mass.                               2. Elevated CA-125 (but decreasing values).                               3. Infertility.                               4. Severe endometriosis.                               5. Severe pelvic adhesions.  PROCEDURES:                   1. Diagnostic laparoscopy.                               2. Laparoscopic lysis of adhesions.                               3. Laparoscopic pelvic biopsies.                               4. Chromopertubation.                               5. Laparoscopic evacuation of an endometrioma in                                  the right tube.  SURGEON:                      Janine Limbo, M.D.  ANESTHESIA:                   General.  INDICATIONS:                  The patient is a 50 year old female, para 0-0-3-0, who presents with the above-mentioned diagnoses.  She understands the indications for her surgical procedure and she  accepts the risks of (but not limited to) anesthetic complications, bleeding, infection and possible damage to the surrounding organs.  She understands that no guarantees can be given concerning the total relief of her pain.  She understands that it is possible that we may need to perform a salpingo-oophorectomy.  FINDINGS:                     The  uterus was upper limits of normal size. There was severe endometriosis throughout the pelvis and the abdominal cavity. The posterior cul-de-sac had severe endometriosis and adhesions.  There were endometriosis lesions measuring greater than 1 cm in size.  The right fallopian tube was approximately 7 cm in size and it contained fluid and dark brown to red tissue consistent with endometriosis in the tube.  The right ovary was upper limits of normal size and it was severely adhered to the right pelvic side wall, the right fallopian tube, the right fundus of the uterus and the right posterior cul-de-sac.  The left ovary was normal size.  It had dense adhesions between the left pelvic side wall and the left posterior cul-de-sac. The left fallopian tube was surprisingly normal.  The fimbriated end of the left tube was delicate.  On chromopertubation, the tube was noted to fill with dye and dye did spill into the pelvic cavity.  No dye was able to spill fro the right fallopian tube, however.  There were endometriosis lesions present in the anterior cul-de-sac, with the largest measuring approximately 1 cm in size.  The posterior cul-de-sac contained moderate adhesions.  The appendix had multiple endometriosis lesions present on it measuring less than 0.5 cm in size.  The bowel and the omentum had multiple endometriosis lesions on it in the pelvis.  These lesions measured less than 0.5 cm in size.  There was a flesh-appearing lesion measuring 1 cm on the diaphragm to the right of the midline.  This was not consistent with endometriosis.  The liver and the gallbladder appeared normal.  DESCRIPTION OF PROCEDURE:     The patient was taken to the operating room, where a general anesthetic was given.  The patients abdomen, perineum and vagina were prepped with multiple layers of Betadine.  A Foley catheter was placed in the bladder.  The patient was sterilely draped after a Hulka tenaculum was placed  inside the uterus.  The subumbilical area was injected with 5 cc of 0.5% Marcaine.  An incision was made and a Veress needle was inserted into the abdominal cavity without difficulty.  Proper placement was confirmed using the saline drop test.  A pneumoperitoneum was obtained.  The laparoscopic trocar and the laparoscope were then inserted.  The pelvic structures were visualized with findings as mentioned above.  Two suprapubic incisions were made after injecting the areas with a total of 5 cc of 0.5% Marcaine.  Two 5 mm trocars were placed into the lower cavity under direct visualization.  Pictures were taken of the patients pelvic anatomy.  The pelvis was irrigated.  Lysis of adhesions was accomplished in the right adnexa, though we were not able to totally remove all of the adhesions and the right ovary remained stuck, even after lysis of adhesions.  We did open the right fallopian tube and drain the endometrioma that was present.  Biopsies were obtained from lesions in the pelvis.  We carefully considered what we were likely to accomplish with our operative procedure, and the decision was made to terminate the  procedure because we felt that we were not able to adequately lyse the adhesions because of the severity of the disease.  We did biopsy the lesion on the diaphragm.  Chromopertubation was performed and dye was noted to quickly fill and spill from the left tube.  Dye did not spill fro the right tube.  The pelvis was again irrigated.  Hemostasis was noted to be adequate.  The decision was made to terminate the procedure at this point. The bowel was carefully inspected and there was no evidence of trocar damage. The pneumoperitoneum was allowed to escape.  All instruments were removed. The incisions were closed using deep and superficial sutures of 4-0 Vicryl. Sponge, needle and instrument counts were correct.  Estimated blood loss was 10 cc.  The patient tolerated her procedure well.   She was taken to the recovery room in stable condition.   FOLLOW-UP INSTRUCTIONS:       The patient was given Tylenol #3, 1-2 p.o. q.4h. p.r.n. pain.  She will return to see Dr. Stefano Gaul in 2-3 weeks for follow-up examination.  She was given a copy of the postoperative instruction sheet as prepared by the [facility] or [facility] for patients who have undergone laparoscopy.  We will plan Lupron therapy and more extensive surgery via laparotomy at a later time. Dictated by:   Janine Limbo, M.D. Attending Physician:  Leonard Schwartz DD:  04/16/01 TD:  04/17/01 Job: [id number]

## 2010-12-24 LAB — POCT RAPID STREP A: Streptococcus, Group A Screen (Direct): NEGATIVE

## 2010-12-26 LAB — CBC
HCT: 31.7 — ABNORMAL LOW
Hemoglobin: 10.7 — ABNORMAL LOW
MCHC: 33.8
MCV: 80.1
Platelets: 253
RBC: 3.96
RDW: 14.5
WBC: 6.9

## 2010-12-26 LAB — PREGNANCY, URINE: Preg Test, Ur: NEGATIVE

## 2010-12-26 LAB — COMPREHENSIVE METABOLIC PANEL
ALT: 18
AST: 24
Albumin: 3.7
Alkaline Phosphatase: 61
BUN: 14
CO2: 28
Calcium: 10.3
Chloride: 104
Creatinine, Ser: 0.73
GFR calc Af Amer: >60
GFR calc non Af Amer: >60
Glucose, Bld: 103 — ABNORMAL HIGH
Potassium: 4.1
Sodium: 140
Total Bilirubin: 0.8
Total Protein: 7

## 2010-12-29 LAB — BASIC METABOLIC PANEL
BUN: 17
CO2: 26
Calcium: 9.9
Chloride: 104
Creatinine, Ser: 0.75
GFR calc Af Amer: >60
GFR calc non Af Amer: >60
Glucose, Bld: 123 — ABNORMAL HIGH
Potassium: 3.6
Sodium: 139

## 2010-12-29 LAB — TYPE AND SCREEN
ABO/RH(D): O POS
Antibody Screen: NEGATIVE

## 2010-12-29 LAB — CBC
HCT: 34.9 — ABNORMAL LOW
Hemoglobin: 11.9 — ABNORMAL LOW
MCHC: 34.2
MCV: 79.4
Platelets: 375
RBC: 4.39
RDW: 14.8
WBC: 4.9

## 2010-12-29 LAB — ABO/RH: ABO/RH(D): O POS

## 2011-01-05 ENCOUNTER — Other Ambulatory Visit (HOSPITAL_COMMUNITY): Payer: Self-pay | Admitting: Obstetrics and Gynecology

## 2011-01-05 DIAGNOSIS — Z1231 Encounter for screening mammogram for malignant neoplasm of breast: Secondary | ICD-10-CM

## 2011-01-15 LAB — CBC
HCT: 40.6
Hemoglobin: 13.8
MCHC: 34
MCV: 82.5
Platelets: 431 — ABNORMAL HIGH
RBC: 4.92
RDW: 14.2 — ABNORMAL HIGH
WBC: 4.9

## 2011-01-15 LAB — WET PREP, GENITAL
Clue Cells Wet Prep HPF POC: NONE SEEN
Trich, Wet Prep: NONE SEEN
Yeast Wet Prep HPF POC: NONE SEEN

## 2011-01-15 LAB — GC/CHLAMYDIA PROBE AMP, GENITAL
Chlamydia, DNA Probe: NEGATIVE
GC Probe Amp, Genital: NEGATIVE

## 2011-01-15 LAB — HCG, QUANTITATIVE, PREGNANCY: hCG, Beta Chain, Quant, S: <2

## 2011-01-16 LAB — COMPREHENSIVE METABOLIC PANEL
ALT: 20
AST: 25
Albumin: 3.3 — ABNORMAL LOW
Alkaline Phosphatase: 50
BUN: 12
CO2: 25
Calcium: 8.8
Chloride: 104
Creatinine, Ser: 0.83
GFR calc Af Amer: >60
GFR calc non Af Amer: >60
Glucose, Bld: 121 — ABNORMAL HIGH
Potassium: 3.6
Sodium: 134 — ABNORMAL LOW
Total Bilirubin: 0.7
Total Protein: 6.9

## 2011-01-16 LAB — DIFFERENTIAL
Basophils Absolute: 0
Basophils Relative: 0
Eosinophils Absolute: 0
Eosinophils Relative: 0
Lymphocytes Relative: 17
Lymphs Abs: 0.9
Monocytes Absolute: 0.4
Monocytes Relative: 7
Neutro Abs: 3.8
Neutrophils Relative %: 75

## 2011-01-16 LAB — CBC
HCT: 38
Hemoglobin: 12.8
MCHC: 33.7
MCV: 81.6
Platelets: 369
RBC: 4.65
RDW: 14.1 — ABNORMAL HIGH
WBC: 5.1

## 2011-01-20 ENCOUNTER — Ambulatory Visit (HOSPITAL_COMMUNITY)
Admission: RE | Admit: 2011-01-20 | Discharge: 2011-01-20 | Disposition: A | Discharge status: Home / Self Care | Payer: 59 | Source: Ambulatory Visit | Attending: Obstetrics and Gynecology | Admitting: Obstetrics and Gynecology

## 2011-01-20 DIAGNOSIS — Z1231 Encounter for screening mammogram for malignant neoplasm of breast: Secondary | ICD-10-CM | POA: Insufficient documentation

## 2011-06-25 ENCOUNTER — Encounter (HOSPITAL_COMMUNITY)
Admission: RE | Disposition: A | Discharge status: Home / Self Care | Payer: Self-pay | Source: Ambulatory Visit | Attending: Gastroenterology

## 2011-06-25 ENCOUNTER — Ambulatory Visit (HOSPITAL_COMMUNITY)
Admission: RE | Admit: 2011-06-25 | Discharge: 2011-06-25 | Disposition: A | Discharge status: Home / Self Care | Payer: 59 | Source: Ambulatory Visit | Attending: Gastroenterology | Admitting: Gastroenterology

## 2011-06-25 ENCOUNTER — Encounter (HOSPITAL_COMMUNITY): Payer: Self-pay | Admitting: Anesthesiology

## 2011-06-25 ENCOUNTER — Ambulatory Visit (HOSPITAL_COMMUNITY): Payer: 59 | Admitting: Anesthesiology

## 2011-06-25 ENCOUNTER — Encounter (HOSPITAL_COMMUNITY): Payer: Self-pay

## 2011-06-25 DIAGNOSIS — Z1211 Encounter for screening for malignant neoplasm of colon: Secondary | ICD-10-CM | POA: Insufficient documentation

## 2011-06-25 DIAGNOSIS — I10 Essential (primary) hypertension: Secondary | ICD-10-CM | POA: Insufficient documentation

## 2011-06-25 DIAGNOSIS — Z79899 Other long term (current) drug therapy: Secondary | ICD-10-CM | POA: Insufficient documentation

## 2011-06-25 HISTORY — DX: Depression, unspecified: F32.A

## 2011-06-25 HISTORY — PX: COLONOSCOPY: SHX5424

## 2011-06-25 HISTORY — DX: Plantar fascial fibromatosis: M72.2

## 2011-06-25 HISTORY — DX: Essential (primary) hypertension: I10

## 2011-06-25 SURGERY — COLONOSCOPY
Anesthesia: Monitor Anesthesia Care

## 2011-06-25 MED ORDER — PROMETHAZINE HCL 25 MG/ML IJ SOLN: 6.2500 mg | INTRAMUSCULAR | Status: DC | PRN

## 2011-06-25 MED ORDER — FENTANYL CITRATE 0.05 MG/ML IJ SOLN
INTRAMUSCULAR | Status: DC | PRN
  Administered 2011-06-25 (×2): 50 ug via INTRAVENOUS

## 2011-06-25 MED ORDER — LACTATED RINGERS IV SOLN: INTRAVENOUS | Status: DC

## 2011-06-25 MED ORDER — KETAMINE HCL 10 MG/ML IJ SOLN
INTRAMUSCULAR | Status: DC | PRN
  Administered 2011-06-25 (×2): 10 mg via INTRAVENOUS

## 2011-06-25 MED ORDER — DROPERIDOL 2.5 MG/ML IJ SOLN
INTRAMUSCULAR | Status: DC | PRN
  Administered 2011-06-25: .5 mg via INTRAVENOUS

## 2011-06-25 MED ORDER — LACTATED RINGERS IV SOLN
INTRAVENOUS | Status: DC | PRN
  Administered 2011-06-25: 14:00:00 via INTRAVENOUS

## 2011-06-25 MED ORDER — PROPOFOL 10 MG/ML IV EMUL
INTRAVENOUS | Status: DC | PRN
  Administered 2011-06-25: 125 ug/kg/min via INTRAVENOUS

## 2011-06-25 MED ORDER — ONDANSETRON HCL 4 MG/2ML IJ SOLN
INTRAMUSCULAR | Status: DC | PRN
  Administered 2011-06-25 (×2): 2 mg via INTRAVENOUS

## 2011-06-25 MED ORDER — MIDAZOLAM HCL 5 MG/5ML IJ SOLN
INTRAMUSCULAR | Status: DC | PRN
  Administered 2011-06-25: 2 mg via INTRAVENOUS

## 2011-06-25 MED ORDER — MEPERIDINE HCL 100 MG/ML IJ SOLN: 6.2500 mg | INTRAMUSCULAR | Status: DC | PRN

## 2011-06-25 NOTE — Anesthesia Preprocedure Evaluation (Signed)
Anesthesia Evaluation  Patient identified by MRN, date of birth, ID band Patient awake    Reviewed: Allergy & Precautions, H&P , NPO status , Patient's Chart, lab work & pertinent test results  Airway Mallampati: II TM Distance: >3 FB Neck ROM: full    Dental No notable dental hx.    Pulmonary neg pulmonary ROS,  breath sounds clear to auscultation  Pulmonary exam normal       Cardiovascular Exercise Tolerance: Good hypertension, negative cardio ROS  Rhythm:regular Rate:Normal     Neuro/Psych negative neurological ROS  negative psych ROS   GI/Hepatic negative GI ROS, Neg liver ROS,   Endo/Other  negative endocrine ROS  Renal/GU negative Renal ROS  negative genitourinary   Musculoskeletal   Abdominal   Peds  Hematology negative hematology ROS (+)   Anesthesia Other Findings   Reproductive/Obstetrics negative OB ROS                           Anesthesia Physical Anesthesia Plan  ASA: II  Anesthesia Plan: General   Post-op Pain Management:    Induction:   Airway Management Planned:   Additional Equipment:   Intra-op Plan:   Post-operative Plan:   Informed Consent: I have reviewed the patients History and Physical, chart, labs and discussed the procedure including the risks, benefits and alternatives for the proposed anesthesia with the patient or authorized representative who has indicated his/her understanding and acceptance.   Dental Advisory Given  Plan Discussed with: CRNA  Anesthesia Plan Comments:         Anesthesia Quick Evaluation

## 2011-06-25 NOTE — Op Note (Signed)
Procedure: Screening colonoscopy.  Endoscopist: Danise Edge  Premedication: Propofol administered by anesthesia  Procedure: The patient was placed in the left lateral decubitus position. Anal inspection and digital rectal exam were normal. The Pentax pediatric colonoscope was introduced into the rectum and easily advanced to the cecum. A normal-appearing ileocecal valve and appendiceal orifice were identified. Colonic preparation for the exam today was good.  Rectum. Normal. Retroflex view of the distal rectum was normal.  Sigmoid colon. Normal.  Ascending colon. Normal.  Splenic flexure. Normal.  Transverse colon.  Hepatic flexure. Normal.  Descending colon. Normal.  Cecum and ileocecal valve. Normal.  Assessment: Normal screening proctocolonoscopy to the cecum.  Recommendations: Repeat screening colonoscopy in 10 years.

## 2011-06-25 NOTE — Discharge Instructions (Signed)
Colonoscopy Care After Read the instructions outlined below and refer to this sheet in the next few weeks. These discharge instructions provide you with general information on caring for yourself after you leave the hospital. Your doctor may also give you specific instructions. While your treatment has been planned according to the most current medical practices available, unavoidable complications occasionally occur. If you have any problems or questions after discharge, call your doctor. HOME CARE INSTRUCTIONS ACTIVITY:  You may resume your regular activity, but move at a slower pace for the next 24 hours.   Take frequent rest periods for the next 24 hours.   Walking will help get rid of the air and reduce the bloated feeling in your belly (abdomen).   No driving for 24 hours (because of the medicine (anesthesia) used during the test).   You may shower.   Do not sign any important legal documents or operate any machinery for 24 hours (because of the anesthesia used during the test).  NUTRITION:  Drink plenty of fluids.   You may resume your normal diet as instructed by your doctor.   Begin with a light meal and progress to your normal diet. Heavy or fried foods are harder to digest and may make you feel sick to your stomach (nauseated).   Avoid alcoholic beverages for 24 hours or as instructed.  MEDICATIONS:  You may resume your normal medications unless your doctor tells you otherwise.  WHAT TO EXPECT TODAY:  Some feelings of bloating in the abdomen.   Passage of more gas than usual.   Spotting of blood in your stool or on the toilet paper.  IF YOU HAD POLYPS REMOVED DURING THE COLONOSCOPY:  No aspirin products for 7 days or as instructed.   No alcohol for 7 days or as instructed.   Eat a soft diet for the next 24 hours.  FINDING OUT THE RESULTS OF YOUR TEST Not all test results are available during your visit. If your test results are not back during the visit, make an  appointment with your caregiver to find out the results. Do not assume everything is normal if you have not heard from your caregiver or the medical facility. It is important for you to follow up on all of your test results.  SEEK IMMEDIATE MEDICAL CARE IF:  You have more than a spotting of blood in your stool.   Your belly is swollen (abdominal distention).   You are nauseated or vomiting.   You have a fever.   You have abdominal pain or discomfort that is severe or gets worse throughout the day.  Document Released: 11/05/2003 Document Revised: 03/12/2011 Document Reviewed: 11/03/2007 Exit[employer] Patient Information 2012 [employer]. 

## 2011-06-25 NOTE — Transfer of Care (Signed)
Immediate Anesthesia Transfer of Care Note  Patient: Christine Eaton  Procedure(s) Performed: Procedure(s) (LRB): COLONOSCOPY (N/A)  Patient Location: PACU  Anesthesia Type: MAC  Level of Consciousness: awake and alert   Airway & Oxygen Therapy: Patient Spontanous Breathing and Patient connected to face mask oxygen  Post-op Assessment: Report given to PACU RN and Post -op Vital signs reviewed and stable  Post vital signs: Reviewed and stable  Complications: No apparent anesthesia complications

## 2011-06-25 NOTE — H&P (Signed)
History: [name] is a 51 year old female born 1961-03-19. The patient is scheduled to undergo her first screening colonoscopy with polypectomy to prevent colon cancer. She viewed our colonoscopy education film in my office. I discussed with her the complications associated with colonoscopy including intestinal bleeding, intestinal perforation requiring surgical repair, and oversedation.  Medication allergies: Lisinopril causes cough.  Chronic medications: Losartan. Hydrochlorothiazide. Citalopram. Multivitamin. Calcium. Vitamin B.  Past medical and surgical history: Hypertension. Depression. Microscopic hematuria evaluation in 1998. Infertility. Right breast fibroadenoma. Plantar fasciitis. Endometriosis. Left inguinal hernia surgery. Left arm cyst surgery. Total abdominal hysterectomy with bilateral salpingo-oophorectomy. Laparoscopic lysis of adhesions and laparoscopic evacuation of endometrioma on the right tube.  Habits: The patient quit smoking cigarettes in 2008 after a 10-pack-year history of cigarette smoking. She consumes alcohol in moderation.  Exam: The patient is alert and lying comfortably on the endoscopy stretcher. Sclera are nonicteric. Mouth and throat appear normal. Lungs are clear to auscultation. Cardiac exam reveals a regular rhythm without audible murmurs. Abdomen is soft, flat nontender to palpation in all quadrants.  Plan: Proceed with screening colonoscopy.

## 2011-06-26 ENCOUNTER — Encounter (HOSPITAL_COMMUNITY): Payer: Self-pay | Admitting: Gastroenterology

## 2011-06-29 NOTE — Anesthesia Postprocedure Evaluation (Signed)
  Anesthesia Post-op Note  Patient: Christine Eaton  Procedure(s) Performed: Procedure(s) (LRB): COLONOSCOPY (N/A)  Patient Location: PACU  Anesthesia Type: MAC  Level of Consciousness: awake and alert   Airway and Oxygen Therapy: Patient Spontanous Breathing  Post-op Pain: mild  Post-op Assessment: Post-op Vital signs reviewed, Patient's Cardiovascular Status Stable, Respiratory Function Stable, Patent Airway and No signs of Nausea or vomiting  Post-op Vital Signs: stable  Complications: No apparent anesthesia complications

## 2011-12-17 ENCOUNTER — Other Ambulatory Visit (HOSPITAL_COMMUNITY): Payer: Self-pay | Admitting: Orthopedic Surgery

## 2011-12-17 DIAGNOSIS — M25562 Pain in left knee: Secondary | ICD-10-CM

## 2011-12-19 ENCOUNTER — Other Ambulatory Visit (HOSPITAL_COMMUNITY): Payer: 59

## 2011-12-24 ENCOUNTER — Telehealth: Payer: Self-pay | Admitting: Obstetrics and Gynecology

## 2011-12-24 MED ORDER — VALACYCLOVIR HCL 500 MG PO TABS: 500.0000 mg | ORAL_TABLET | Freq: Every day | ORAL | Status: DC

## 2011-12-24 NOTE — Telephone Encounter (Signed)
FAx received for Rf Valtrex. Tc to pt. Scheduled for annual 01/07/12. Will Rf until then,

## 2011-12-30 ENCOUNTER — Inpatient Hospital Stay (HOSPITAL_COMMUNITY): Admission: RE | Admit: 2011-12-30 | Payer: 59 | Source: Ambulatory Visit

## 2012-01-04 ENCOUNTER — Ambulatory Visit (HOSPITAL_COMMUNITY)
Admission: RE | Admit: 2012-01-04 | Discharge: 2012-01-04 | Disposition: A | Discharge status: Home / Self Care | Payer: 59 | Source: Ambulatory Visit | Attending: Orthopedic Surgery | Admitting: Orthopedic Surgery

## 2012-01-04 DIAGNOSIS — IMO0002 Reserved for concepts with insufficient information to code with codable children: Secondary | ICD-10-CM | POA: Insufficient documentation

## 2012-01-04 DIAGNOSIS — X58XXXA Exposure to other specified factors, initial encounter: Secondary | ICD-10-CM | POA: Insufficient documentation

## 2012-01-06 ENCOUNTER — Ambulatory Visit (HOSPITAL_COMMUNITY): Admission: RE | Admit: 2012-01-06 | Payer: 59 | Source: Ambulatory Visit

## 2012-01-07 ENCOUNTER — Other Ambulatory Visit (HOSPITAL_COMMUNITY): Payer: Self-pay | Admitting: Internal Medicine

## 2012-01-07 ENCOUNTER — Ambulatory Visit: Payer: Self-pay | Admitting: Obstetrics and Gynecology

## 2012-01-07 DIAGNOSIS — Z1231 Encounter for screening mammogram for malignant neoplasm of breast: Secondary | ICD-10-CM

## 2012-01-26 ENCOUNTER — Ambulatory Visit (HOSPITAL_COMMUNITY)
Admission: RE | Admit: 2012-01-26 | Discharge: 2012-01-26 | Disposition: A | Discharge status: Home / Self Care | Payer: 59 | Source: Ambulatory Visit | Attending: Internal Medicine | Admitting: Internal Medicine

## 2012-01-26 DIAGNOSIS — Z1231 Encounter for screening mammogram for malignant neoplasm of breast: Secondary | ICD-10-CM | POA: Insufficient documentation

## 2012-01-27 ENCOUNTER — Other Ambulatory Visit: Payer: Self-pay

## 2012-01-27 ENCOUNTER — Other Ambulatory Visit: Payer: Self-pay | Admitting: Obstetrics and Gynecology

## 2012-01-27 ENCOUNTER — Encounter: Payer: Self-pay | Admitting: Obstetrics and Gynecology

## 2012-01-27 ENCOUNTER — Ambulatory Visit (INDEPENDENT_AMBULATORY_CARE_PROVIDER_SITE_OTHER): Payer: 59 | Admitting: Obstetrics and Gynecology

## 2012-01-27 VITALS — BP 120/70 | HR 78 | Ht 67.0 in | Wt 223.0 lb

## 2012-01-27 DIAGNOSIS — Z01419 Encounter for gynecological examination (general) (routine) without abnormal findings: Secondary | ICD-10-CM

## 2012-01-27 DIAGNOSIS — Z124 Encounter for screening for malignant neoplasm of cervix: Secondary | ICD-10-CM

## 2012-01-27 MED ORDER — VALACYCLOVIR HCL 500 MG PO TABS: 500.0000 mg | ORAL_TABLET | Freq: Every day | ORAL | Status: DC

## 2012-01-27 NOTE — Progress Notes (Signed)
Regular Periods: no Mammogram: yes  Monthly Breast Ex.: yes Exercise: no  Tetanus < 10 years: yes Seatbelts: yes  NI. Bladder Functn.: yes Abuse at home: no  Daily BM's: yes Stressful Work: yes  Healthy Diet: yes Sigmoid-Colonoscopy: 06/25/11   NL  Calcium: no Medical problems this year: NONE   LAST PAP:2012  NL  Contraception: NONE PT HAD HYST  Mammogram:  01/26/12  PCP: DR. Kirby Funk  PMH: NO CHANGE  FMH: NO CHANGE  Last Bone Scan: NO  PT IS WIDOW

## 2012-01-27 NOTE — Telephone Encounter (Signed)
LM ON PT VM CONCERNING RX VALTREX. WILL SEND TO PT PHARMACY

## 2012-01-27 NOTE — Progress Notes (Signed)
Subjective:    Christine Eaton is a 51 y.o. female G2P0 who presents for annual exam. The patient has no complaints today.     Review of Systems Gastrointestinal:No change in bowel habits, no abdominal pain, no rectal bleeding Genitourinary:negative for abnormal vaginal bleeding,  dysuria, frequency, hematuria, nocturia and urinary incontinence    Objective:     BP 120/70  Pulse 78  Ht 5\' 7"  (1.702 m)  Wt 223 lb (101.152 kg)  BMI 34.93 kg/m2 Weight:  Wt Readings from Last 1 Encounters:  01/27/12 223 lb (101.152 kg)   Body mass index is 34.93 kg/(m^2). General Appearance:  Well nourished in no acute distress HEENT: Grossly normal Neck / Thyroid: Supple, no masses, nodes or enlargement Lungs: Clear to auscultation bilaterally Back: No CVA tenderness Breast Exam: No masses or nodes.No dimpling, nipple retraction or discharge. Cardiovascular: Regular rate and rhythm.  Gastrointestinal: Soft, non-tender, no masses or organomegaly Pelvic Exam: EGBUS-normal, vagina-pink mucosa without lesions, cervix-no tenderness or lesions, uterus-appears normal size, shape and consistency, adnexae-no masses or tenderness Rectovaginal: Normal sphincter tone and  no masses  Lymphatic Exam: Non-palpable nodes in neck, clavicular, axillary, or inguinal regions Skin: No rash or abnormalities Extremities: no clubbing cyanosis or edema Neurologic: Grossly normal Psychiatric: Alert and oriented x 3    Assessment:   Routine GYN Exam   Plan:   RTO 1 year or prn  Reviewed revised PAP smear guidelines for patients with hysterectomy, however, patient wants to continue her PAP smears for now.  PAP sent  Jamone Garrido,ELMIRAPA-C

## 2012-01-28 ENCOUNTER — Encounter (HOSPITAL_BASED_OUTPATIENT_CLINIC_OR_DEPARTMENT_OTHER): Payer: Self-pay | Admitting: *Deleted

## 2012-01-28 ENCOUNTER — Encounter (HOSPITAL_BASED_OUTPATIENT_CLINIC_OR_DEPARTMENT_OTHER)
Admission: RE | Admit: 2012-01-28 | Discharge: 2012-01-28 | Disposition: A | Discharge status: Home / Self Care | Payer: 59 | Source: Ambulatory Visit | Attending: Orthopedic Surgery | Admitting: Orthopedic Surgery

## 2012-01-28 LAB — BASIC METABOLIC PANEL
BUN: 17 mg/dL (ref 6–23)
CO2: 27 mEq/L (ref 19–32)
Calcium: 9.3 mg/dL (ref 8.4–10.5)
Chloride: 103 mEq/L (ref 96–112)
Creatinine, Ser: 0.68 mg/dL (ref 0.50–1.10)
GFR calc Af Amer: >90 mL/min (ref >90)
GFR calc non Af Amer: >90 mL/min (ref >90)
Glucose, Bld: 110 mg/dL — ABNORMAL HIGH (ref 70–99)
Potassium: 3.9 mEq/L (ref 3.5–5.1)
Sodium: 138 mEq/L (ref 135–145)

## 2012-01-28 LAB — PAP IG W/ RFLX HPV ASCU

## 2012-01-28 NOTE — Progress Notes (Signed)
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

## 2012-02-01 ENCOUNTER — Other Ambulatory Visit: Payer: Self-pay | Admitting: Physician Assistant

## 2012-02-01 ENCOUNTER — Encounter: Payer: Self-pay | Admitting: Physician Assistant

## 2012-02-01 DIAGNOSIS — M722 Plantar fascial fibromatosis: Secondary | ICD-10-CM | POA: Insufficient documentation

## 2012-02-01 DIAGNOSIS — D219 Benign neoplasm of connective and other soft tissue, unspecified: Secondary | ICD-10-CM | POA: Insufficient documentation

## 2012-02-01 DIAGNOSIS — R102 Pelvic and perineal pain: Secondary | ICD-10-CM | POA: Insufficient documentation

## 2012-02-01 DIAGNOSIS — D241 Benign neoplasm of right breast: Secondary | ICD-10-CM | POA: Insufficient documentation

## 2012-02-01 DIAGNOSIS — I10 Essential (primary) hypertension: Secondary | ICD-10-CM

## 2012-02-01 DIAGNOSIS — S83242A Other tear of medial meniscus, current injury, left knee, initial encounter: Secondary | ICD-10-CM | POA: Insufficient documentation

## 2012-02-01 NOTE — H&P (Signed)
Christine Eaton is an 51 y.o. female.   Chief Complaint: left knee pain HPI: Christine Eaton is a 51 year old seen for follow-up from her significant persistent left knee pain. This has been bothering her for the past 4-5 months. She had aspiration and injection of the left knee 2 months ago with temporary relief but her pain has been persistent. She had a left knee MRI on 01/04/12 that showed a medial meniscus tear with medial compartment significant chondromalacia with medial femoral condyle subchondral edema. She has continued to have significant pain.  Past Medical History  Diagnosis Date  . Hypertension   . Depression   . Endometriosis     STAGE 4  . Plantar fasciitis     hx  . Fibroadenoma of right breast   . Fibroids     H/O  . Pelvic pain in female     H/O  . Acute medial meniscus tear of left knee     Past Surgical History  Procedure Date  . Bilateral salpingoophorectomy 2009  . Abdominal adhesion surgery 2009+  . Colonoscopy 06/25/2011    Procedure: COLONOSCOPY;  Surgeon: Martin K Johnson, MD;  Location: [facility];  Service: Endoscopy;  Laterality: N/A;  MAC  . Abdominal hysterectomy 2009  . Breast surgery 2005    CYSTECTOMY  . Adrenal gland surgery 2002  . Colonoscopy   . Hernia repair 1989    left  . Vaginal prolapse repair 2009    post op vag hyst-bleed-tear    Family History  Problem Relation Age of Onset  . Colon cancer Neg Hx   . Hypertension Mother   . Cancer Mother     OVARIAN  . Diabetes Father   . Cancer Father     LUNG  . Hypertension Maternal Aunt   . Hypertension Maternal Uncle   . Hypertension Maternal Aunt    Social History:  reports that she has never smoked. She has never used smokeless tobacco. She reports that she drinks about 1.8 ounces of alcohol per week. She reports that she does not use illicit drugs.  Allergies: No Known Allergies   Current Outpatient Prescriptions on File Prior to Visit  Medication Sig Dispense Refill  .  Amlodipine-Valsartan-HCTZ (EXFORGE HCT PO) Take by mouth.      . citalopram (CELEXA) 40 MG tablet Take 40 mg by mouth daily.      . Multiple Vitamin (MULTIVITAMIN) tablet Take 1 tablet by mouth daily.      . traMADol (ULTRAM) 50 MG tablet Take 50 mg by mouth every 6 (six) hours as needed.        (Not in a hospital admission)  No results found for this or any previous visit (from the past 48 hour(s)). No results found.  Review of Systems  Constitutional: Negative.   HENT: Negative.   Eyes: Negative.   Respiratory: Negative.   Cardiovascular: Negative.   Gastrointestinal: Negative.   Genitourinary: Negative.   Musculoskeletal:       Left knee pain  Skin: Negative.   Neurological: Negative.   Endo/Heme/Allergies: Negative.   Psychiatric/Behavioral: Negative.     There were no vitals taken for this visit. Physical Exam  Constitutional: She is oriented to person, place, and time. She appears well-developed and well-nourished.  HENT:  Head: Normocephalic and atraumatic.  Mouth/Throat: Oropharynx is clear and moist.  Eyes: Conjunctivae normal and EOM are normal. Pupils are equal, round, and reactive to light.  Neck: Normal range of motion. Neck supple.    Cardiovascular: Normal rate, regular rhythm and normal heart sounds.   Respiratory: Effort normal.  GI: Soft.  Genitourinary:       Not pertinent to current symptomatology therefore not examined.  Musculoskeletal:       Alert and oriented. Examination of her left knee reveals 1+ effusion pain medially, range of motion 0-125 degrees knee is stable with normal patella tracking. Exam of her right knee reveals full range of motion without pain swelling weakness or instability. Vascular exam: pulses 2+ and symmetric.  Neurological: She is alert and oriented to person, place, and time. She has normal reflexes.  Skin: Skin is warm and dry.  Psychiatric: She has a normal mood and affect. Her behavior is normal. Judgment and thought  content normal.     Assessment Patient Active Problem List  Diagnosis  . Hypertension  . Plantar fasciitis  . Fibroadenoma of right breast  . Fibroids  . Pelvic pain in female  . Acute medial meniscus tear of left knee    Plan I talk to her about this in detail. I would recommend with her significant mechanical symptoms that we proceed with left knee arthroscopy with attention to her meniscal and chondral pathology. I told her we can no relieve her arthritic symptoms with arthroscopy but can help her mechanical symptoms which is the majority of her pain. Discussed risks benefits and possible complications of the surgery in detail and she understands this completely. We'll plan on setting her up for this at some point in the near future. She will be on crutches for 10-14 days partial weightbearing post-op because of her subchondral edema. She'll be out of work for approximately 2 weeks.  Christine Eaton 02/01/2012, 5:40 PM    

## 2012-02-02 ENCOUNTER — Encounter (HOSPITAL_BASED_OUTPATIENT_CLINIC_OR_DEPARTMENT_OTHER): Payer: Self-pay | Admitting: Anesthesiology

## 2012-02-02 ENCOUNTER — Ambulatory Visit (HOSPITAL_BASED_OUTPATIENT_CLINIC_OR_DEPARTMENT_OTHER): Admission: RE | Admit: 2012-02-02 | Payer: 59 | Source: Ambulatory Visit | Admitting: Orthopedic Surgery

## 2012-02-02 ENCOUNTER — Ambulatory Visit (HOSPITAL_BASED_OUTPATIENT_CLINIC_OR_DEPARTMENT_OTHER): Payer: 59 | Admitting: Anesthesiology

## 2012-02-02 ENCOUNTER — Ambulatory Visit (HOSPITAL_BASED_OUTPATIENT_CLINIC_OR_DEPARTMENT_OTHER)
Admission: RE | Admit: 2012-02-02 | Discharge: 2012-02-02 | Disposition: A | Discharge status: Home / Self Care | Payer: 59 | Source: Ambulatory Visit | Attending: Orthopedic Surgery | Admitting: Orthopedic Surgery

## 2012-02-02 ENCOUNTER — Encounter (HOSPITAL_BASED_OUTPATIENT_CLINIC_OR_DEPARTMENT_OTHER): Payer: Self-pay | Admitting: *Deleted

## 2012-02-02 ENCOUNTER — Encounter (HOSPITAL_BASED_OUTPATIENT_CLINIC_OR_DEPARTMENT_OTHER): Admission: RE | Payer: Self-pay | Source: Ambulatory Visit

## 2012-02-02 ENCOUNTER — Encounter (HOSPITAL_BASED_OUTPATIENT_CLINIC_OR_DEPARTMENT_OTHER)
Admission: RE | Disposition: A | Discharge status: Home / Self Care | Payer: Self-pay | Source: Ambulatory Visit | Attending: Orthopedic Surgery

## 2012-02-02 DIAGNOSIS — M23359 Other meniscus derangements, posterior horn of lateral meniscus, unspecified knee: Secondary | ICD-10-CM | POA: Insufficient documentation

## 2012-02-02 DIAGNOSIS — M23329 Other meniscus derangements, posterior horn of medial meniscus, unspecified knee: Secondary | ICD-10-CM | POA: Insufficient documentation

## 2012-02-02 DIAGNOSIS — Z01812 Encounter for preprocedural laboratory examination: Secondary | ICD-10-CM | POA: Insufficient documentation

## 2012-02-02 DIAGNOSIS — S83242A Other tear of medial meniscus, current injury, left knee, initial encounter: Secondary | ICD-10-CM

## 2012-02-02 DIAGNOSIS — M2240 Chondromalacia patellae, unspecified knee: Secondary | ICD-10-CM | POA: Insufficient documentation

## 2012-02-02 DIAGNOSIS — I10 Essential (primary) hypertension: Secondary | ICD-10-CM | POA: Insufficient documentation

## 2012-02-02 DIAGNOSIS — Z01810 Encounter for preprocedural cardiovascular examination: Secondary | ICD-10-CM | POA: Insufficient documentation

## 2012-02-02 DIAGNOSIS — M25562 Pain in left knee: Secondary | ICD-10-CM | POA: Insufficient documentation

## 2012-02-02 HISTORY — PX: KNEE ARTHROSCOPY: SHX127

## 2012-02-02 LAB — POCT HEMOGLOBIN-HEMACUE: Hemoglobin: 13.9 g/dL (ref 12.0–15.0)

## 2012-02-02 SURGERY — ARTHROSCOPY, KNEE
Anesthesia: General | Laterality: Left

## 2012-02-02 SURGERY — ARTHROSCOPY, KNEE
Anesthesia: General | Site: Knee | Laterality: Left | Wound class: Clean

## 2012-02-02 MED ORDER — CHLORHEXIDINE GLUCONATE 4 % EX LIQD: 60.0000 mL | Freq: Once | CUTANEOUS | Status: DC

## 2012-02-02 MED ORDER — LACTATED RINGERS IV SOLN
INTRAVENOUS | Status: DC
  Administered 2012-02-02: 11:00:00 via INTRAVENOUS

## 2012-02-02 MED ORDER — POVIDONE-IODINE 7.5 % EX SOLN: Freq: Once | CUTANEOUS | Status: DC

## 2012-02-02 MED ORDER — ONDANSETRON HCL 4 MG/2ML IJ SOLN: 4.0000 mg | Freq: Once | INTRAMUSCULAR | Status: DC | PRN

## 2012-02-02 MED ORDER — BUPIVACAINE HCL (PF) 0.5 % IJ SOLN
INTRAMUSCULAR | Status: DC | PRN
  Administered 2012-02-02: 20 mL

## 2012-02-02 MED ORDER — MIDAZOLAM HCL 5 MG/5ML IJ SOLN
INTRAMUSCULAR | Status: DC | PRN
  Administered 2012-02-02: 1 mg via INTRAVENOUS

## 2012-02-02 MED ORDER — DEXTROSE 5 % IV SOLN
3.0000 g | INTRAVENOUS | Status: AC
  Administered 2012-02-02: 3 g via INTRAVENOUS

## 2012-02-02 MED ORDER — HYDROCODONE-ACETAMINOPHEN 10-325 MG PO TABS: ORAL_TABLET | ORAL | Status: DC

## 2012-02-02 MED ORDER — SODIUM CHLORIDE 0.9 % IR SOLN
Status: DC | PRN
  Administered 2012-02-02: 6000 mL

## 2012-02-02 MED ORDER — FENTANYL CITRATE 0.05 MG/ML IJ SOLN
50.0000 ug | INTRAMUSCULAR | Status: DC | PRN
  Administered 2012-02-02: 100 ug via INTRAVENOUS

## 2012-02-02 MED ORDER — ONDANSETRON HCL 4 MG/2ML IJ SOLN
INTRAMUSCULAR | Status: DC | PRN
  Administered 2012-02-02: 4 mg via INTRAVENOUS

## 2012-02-02 MED ORDER — MIDAZOLAM HCL 2 MG/2ML IJ SOLN
1.0000 mg | INTRAMUSCULAR | Status: DC | PRN
  Administered 2012-02-02: 2 mg via INTRAVENOUS

## 2012-02-02 MED ORDER — LACTATED RINGERS IV SOLN
INTRAVENOUS | Status: DC
Start: 2012-02-02 — End: 2012-02-02
  Administered 2012-02-02 (×2): via INTRAVENOUS

## 2012-02-02 MED ORDER — OXYCODONE HCL 5 MG PO TABS
5.0000 mg | ORAL_TABLET | Freq: Once | ORAL | Status: AC | PRN
  Administered 2012-02-02: 5 mg via ORAL

## 2012-02-02 MED ORDER — LIDOCAINE HCL (CARDIAC) 20 MG/ML IV SOLN
INTRAVENOUS | Status: DC | PRN
  Administered 2012-02-02: 50 mg via INTRAVENOUS

## 2012-02-02 MED ORDER — DEXAMETHASONE SODIUM PHOSPHATE 4 MG/ML IJ SOLN
INTRAMUSCULAR | Status: DC | PRN
  Administered 2012-02-02: 10 mg via INTRAVENOUS

## 2012-02-02 MED ORDER — HYDROMORPHONE HCL PF 1 MG/ML IJ SOLN
0.2500 mg | INTRAMUSCULAR | Status: DC | PRN
  Administered 2012-02-02: 0.5 mg via INTRAVENOUS

## 2012-02-02 MED ORDER — OXYCODONE HCL 5 MG/5ML PO SOLN: 5.0000 mg | Freq: Once | ORAL | Status: AC | PRN

## 2012-02-02 MED ORDER — PROPOFOL 10 MG/ML IV BOLUS
INTRAVENOUS | Status: DC | PRN
  Administered 2012-02-02: 260 mg via INTRAVENOUS

## 2012-02-02 MED ORDER — EPINEPHRINE HCL 1 MG/ML IJ SOLN
INTRAMUSCULAR | Status: DC | PRN
  Administered 2012-02-02: 2 mg

## 2012-02-02 MED ORDER — FENTANYL CITRATE 0.05 MG/ML IJ SOLN
INTRAMUSCULAR | Status: DC | PRN
  Administered 2012-02-02 (×3): 50 ug via INTRAVENOUS

## 2012-02-02 SURGICAL SUPPLY — 54 items
APL SKNCLS STERI-STRIP NONHPOA (GAUZE/BANDAGES/DRESSINGS)
BANDAGE ELASTIC 6 VELCRO ST LF (GAUZE/BANDAGES/DRESSINGS) ×2 IMPLANT
BANDAGE ESMARK 6X9 LF (GAUZE/BANDAGES/DRESSINGS) IMPLANT
BENZOIN TINCTURE PRP APPL 2/3 (GAUZE/BANDAGES/DRESSINGS) IMPLANT
BLADE CUTTER GATOR 3.5 (BLADE) ×1 IMPLANT
BLADE GREAT WHITE 4.2 (BLADE) ×1 IMPLANT
BLADE SURG 15 STRL LF DISP TIS (BLADE) IMPLANT
BLADE SURG 15 STRL SS (BLADE)
BNDG CMPR 9X6 STRL LF SNTH (GAUZE/BANDAGES/DRESSINGS)
BNDG COHESIVE 4X5 TAN STRL (GAUZE/BANDAGES/DRESSINGS) IMPLANT
BNDG ESMARK 6X9 LF (GAUZE/BANDAGES/DRESSINGS)
CANISTER OMNI JUG 16 LITER (MISCELLANEOUS) ×1 IMPLANT
CANISTER SUCTION 2500CC (MISCELLANEOUS) IMPLANT
DRAPE ARTHROSCOPY W/POUCH 90 (DRAPES) ×2 IMPLANT
DURAPREP 26ML APPLICATOR (WOUND CARE) ×2 IMPLANT
GAUZE XEROFORM 1X8 LF (GAUZE/BANDAGES/DRESSINGS) ×2 IMPLANT
GLOVE BIO SURGEON STRL SZ7 (GLOVE) ×3 IMPLANT
GLOVE BIOGEL PI IND STRL 7.0 (GLOVE) ×1 IMPLANT
GLOVE BIOGEL PI IND STRL 7.5 (GLOVE) ×1 IMPLANT
GLOVE BIOGEL PI INDICATOR 7.0 (GLOVE) ×1
GLOVE BIOGEL PI INDICATOR 7.5 (GLOVE) ×1
GLOVE SS BIOGEL STRL SZ 7.5 (GLOVE) ×1 IMPLANT
GLOVE SUPERSENSE BIOGEL SZ 7.5 (GLOVE) ×1
GOWN PREVENTION PLUS XLARGE (GOWN DISPOSABLE) ×2 IMPLANT
HOLDER KNEE FOAM BLUE (MISCELLANEOUS) ×2 IMPLANT
KNEE WRAP E Z 3 GEL PACK (MISCELLANEOUS) ×2 IMPLANT
KWIRE 4.0 X .062IN (WIRE) IMPLANT
NDL SAFETY ECLIPSE 18X1.5 (NEEDLE) ×2 IMPLANT
NEEDLE HYPO 18GX1.5 SHARP (NEEDLE)
NEEDLE HYPO 22GX1.5 SAFETY (NEEDLE) IMPLANT
PACK ARTHROSCOPY DSU (CUSTOM PROCEDURE TRAY) ×2 IMPLANT
PACK BASIN DAY SURGERY FS (CUSTOM PROCEDURE TRAY) ×2 IMPLANT
PAD ALCOHOL SWAB (MISCELLANEOUS) IMPLANT
SET ARTHROSCOPY TUBING (MISCELLANEOUS) ×2
SET ARTHROSCOPY TUBING LN (MISCELLANEOUS) ×1 IMPLANT
SPONGE GAUZE 4X4 12PLY (GAUZE/BANDAGES/DRESSINGS) ×2 IMPLANT
STRIP CLOSURE SKIN 1/2X4 (GAUZE/BANDAGES/DRESSINGS) IMPLANT
SUCTION FRAZIER TIP 10 FR DISP (SUCTIONS) IMPLANT
SUT ETHILON 4 0 PS 2 18 (SUTURE) ×2 IMPLANT
SUT FIBERWIRE #2 38 T-5 BLUE (SUTURE)
SUT PDS AB 0 CT 36 (SUTURE) IMPLANT
SUT PROLENE 3 0 PS 2 (SUTURE) IMPLANT
SUT VIC AB 0 CT1 18XCR BRD 8 (SUTURE) IMPLANT
SUT VIC AB 0 CT1 8-18 (SUTURE)
SUT VIC AB 2-0 CT1 27 (SUTURE)
SUT VIC AB 2-0 CT1 TAPERPNT 27 (SUTURE) IMPLANT
SUT VIC AB 3-0 PS1 18 (SUTURE)
SUT VIC AB 3-0 PS1 18XBRD (SUTURE) IMPLANT
SUTURE FIBERWR #2 38 T-5 BLUE (SUTURE) IMPLANT
SYR 20CC LL (SYRINGE) IMPLANT
SYR 5ML LL (SYRINGE) ×2 IMPLANT
TOWEL OR 17X24 6PK STRL BLUE (TOWEL DISPOSABLE) ×2 IMPLANT
WAND STAR VAC 90 (SURGICAL WAND) IMPLANT
WATER STERILE IRR 1000ML POUR (IV SOLUTION) ×2 IMPLANT

## 2012-02-02 NOTE — Anesthesia Preprocedure Evaluation (Addendum)
Anesthesia Evaluation  Patient identified by MRN, date of birth, ID band Patient awake    Reviewed: Allergy & Precautions, H&P , NPO status , Patient's Chart, lab work & pertinent test results  Airway Mallampati: I TM Distance: >3 FB Neck ROM: Full    Dental  (+) Teeth Intact and Dental Advisory Given   Pulmonary  breath sounds clear to auscultation        Cardiovascular hypertension, Pt. on medications Rhythm:Regular Rate:Normal     Neuro/Psych    GI/Hepatic   Endo/Other    Renal/GU      Musculoskeletal   Abdominal   Peds  Hematology   Anesthesia Other Findings   Reproductive/Obstetrics                          Anesthesia Physical Anesthesia Plan  ASA: II  Anesthesia Plan: General   Post-op Pain Management:    Induction: Intravenous  Airway Management Planned: LMA  Additional Equipment:   Intra-op Plan:   Post-operative Plan:   Informed Consent: I have reviewed the patients History and Physical, chart, labs and discussed the procedure including the risks, benefits and alternatives for the proposed anesthesia with the patient or authorized representative who has indicated his/her understanding and acceptance.   Dental advisory given  Plan Discussed with: CRNA, Anesthesiologist and Surgeon  Anesthesia Plan Comments:         Anesthesia Quick Evaluation

## 2012-02-02 NOTE — H&P (View-Only) (Signed)
Christine Eaton is an 51 y.o. female.   Chief Complaint: left knee pain HPI: Christine Eaton is a 51 year old seen for follow-up from her significant persistent left knee pain. This has been bothering her for the past 4-5 months. She had aspiration and injection of the left knee 2 months ago with temporary relief but her pain has been persistent. She had a left knee MRI on 01/04/12 that showed a medial meniscus tear with medial compartment significant chondromalacia with medial femoral condyle subchondral edema. She has continued to have significant pain.  Past Medical History  Diagnosis Date  . Hypertension   . Depression   . Endometriosis     STAGE 4  . Plantar fasciitis     hx  . Fibroadenoma of right breast   . Fibroids     H/O  . Pelvic pain in female     H/O  . Acute medial meniscus tear of left knee     Past Surgical History  Procedure Date  . Bilateral salpingoophorectomy 2009  . Abdominal adhesion surgery 2009+  . Colonoscopy 06/25/2011    Procedure: COLONOSCOPY;  Surgeon: Charolett Bumpers, MD;  Location: [facility];  Service: Endoscopy;  Laterality: N/A;  MAC  . Abdominal hysterectomy 2009  . Breast surgery 2005    CYSTECTOMY  . Adrenal gland surgery 2002  . Colonoscopy   . Hernia repair 1989    left  . Vaginal prolapse repair 2009    post op vag hyst-bleed-tear    Family History  Problem Relation Age of Onset  . Colon cancer Neg Hx   . Hypertension Mother   . Cancer Mother     OVARIAN  . Diabetes Father   . Cancer Father     LUNG  . Hypertension Maternal Aunt   . Hypertension Maternal Uncle   . Hypertension Maternal Aunt    Social History:  reports that she has never smoked. She has never used smokeless tobacco. She reports that she drinks about 1.8 ounces of alcohol per week. She reports that she does not use illicit drugs.  Allergies: No Known Allergies   Current Outpatient Prescriptions on File Prior to Visit  Medication Sig Dispense Refill  .  Amlodipine-Valsartan-HCTZ (EXFORGE HCT PO) Take by mouth.      . citalopram (CELEXA) 40 MG tablet Take 40 mg by mouth daily.      . Multiple Vitamin (MULTIVITAMIN) tablet Take 1 tablet by mouth daily.      . traMADol (ULTRAM) 50 MG tablet Take 50 mg by mouth every 6 (six) hours as needed.        (Not in a hospital admission)  No results found for this or any previous visit (from the past 48 hour(s)). No results found.  Review of Systems  Constitutional: Negative.   HENT: Negative.   Eyes: Negative.   Respiratory: Negative.   Cardiovascular: Negative.   Gastrointestinal: Negative.   Genitourinary: Negative.   Musculoskeletal:       Left knee pain  Skin: Negative.   Neurological: Negative.   Endo/Heme/Allergies: Negative.   Psychiatric/Behavioral: Negative.     There were no vitals taken for this visit. Physical Exam  Constitutional: She is oriented to person, place, and time. She appears well-developed and well-nourished.  HENT:  Head: Normocephalic and atraumatic.  Mouth/Throat: Oropharynx is clear and moist.  Eyes: Conjunctivae normal and EOM are normal. Pupils are equal, round, and reactive to light.  Neck: Normal range of motion. Neck supple.  Cardiovascular: Normal rate, regular rhythm and normal heart sounds.   Respiratory: Effort normal.  GI: Soft.  Genitourinary:       Not pertinent to current symptomatology therefore not examined.  Musculoskeletal:       Alert and oriented. Examination of her left knee reveals 1+ effusion pain medially, range of motion 0-125 degrees knee is stable with normal patella tracking. Exam of her right knee reveals full range of motion without pain swelling weakness or instability. Vascular exam: pulses 2+ and symmetric.  Neurological: She is alert and oriented to person, place, and time. She has normal reflexes.  Skin: Skin is warm and dry.  Psychiatric: She has a normal mood and affect. Her behavior is normal. Judgment and thought  content normal.     Assessment Patient Active Problem List  Diagnosis  . Hypertension  . Plantar fasciitis  . Fibroadenoma of right breast  . Fibroids  . Pelvic pain in female  . Acute medial meniscus tear of left knee    Plan I talk to her about this in detail. I would recommend with her significant mechanical symptoms that we proceed with left knee arthroscopy with attention to her meniscal and chondral pathology. I told her we can no relieve her arthritic symptoms with arthroscopy but can help her mechanical symptoms which is the majority of her pain. Discussed risks benefits and possible complications of the surgery in detail and she understands this completely. We'll plan on setting her up for this at some point in the near future. She will be on crutches for 10-14 days partial weightbearing post-op because of her subchondral edema. She'll be out of work for approximately 2 weeks.  Aracelys Glade J 02/01/2012, 5:40 PM

## 2012-02-02 NOTE — Progress Notes (Signed)
Assisted Dr. Crews with left, knee block. Side rails up, monitors on throughout procedure. See vital signs in flow sheet. Tolerated Procedure well. 

## 2012-02-02 NOTE — Interval H&P Note (Signed)
History and Physical Interval Note:  02/02/2012 12:05 PM  Dava Najjar SIGNE TACKITT  has presented today for surgery, with the diagnosis of LEFT KNEE: CHONDROMALACIA - PATELLA, CYST MENISCUS/DISCOID/DERANGEMENT-KNEE 717.7, 717.5  The various methods of treatment have been discussed with the patient and family. After consideration of risks, benefits and other options for treatment, the patient has consented to  Procedure(s) (LRB) with comments: ARTHROSCOPY KNEE (Left) - ARTHROSCOPY KNEE WITH DEBRIDEMENT/SHAVING (CHONDROPLASTY) KNEE ARTHROSCOPY WITH MEDIAL MENISECTOMY (Left) - ARTHROSCOPY KNEE WITH MENISCECTOMY MEDIAL as a surgical intervention .  The patient's history has been reviewed, patient examined, no change in status, stable for surgery.  I have reviewed the patient's chart and labs.  Questions were answered to the patient's satisfaction.     Salvatore Marvel A

## 2012-02-02 NOTE — Anesthesia Procedure Notes (Addendum)
  Narrative:    Procedure Name: LMA Insertion Date/Time: 02/02/2012 12:28 PM Performed by: Meyer Russel Pre-anesthesia Checklist: Patient identified, Emergency Drugs available, Suction available and Patient being monitored Patient Re-evaluated:Patient Re-evaluated prior to inductionOxygen Delivery Method: Circle System Utilized Preoxygenation: Pre-oxygenation with 100% oxygen Intubation Type: IV induction Ventilation: Mask ventilation without difficulty LMA: LMA inserted LMA Size: 4.0 Number of attempts: 1 Airway Equipment and Method: bite block Placement Confirmation: positive ETCO2 and breath sounds checked- equal and bilateral Tube secured with: Tape Dental Injury: Teeth and Oropharynx as per pre-operative assessment

## 2012-02-02 NOTE — Transfer of Care (Signed)
Immediate Anesthesia Transfer of Care Note  Patient: Christine Eaton  Procedure(s) Performed: Procedure(s) (LRB) with comments: ARTHROSCOPY KNEE (Left) - ARTHROSCOPY KNEE WITH DEBRIDEMENT/SHAVING (CHONDROPLASTY) KNEE ARTHROSCOPY WITH MEDIAL MENISECTOMY (Left) - ARTHROSCOPY KNEE WITH MENISCECTOMY MEDIAL  Patient Location: PACU  Anesthesia Type:General  Level of Consciousness: awake, alert  and oriented  Airway & Oxygen Therapy: Patient Spontanous Breathing and Patient connected to face mask oxygen  Post-op Assessment: Report given to PACU RN and Post -op Vital signs reviewed and stable, MAE  Post vital signs: Reviewed and stable  Complications: No apparent anesthesia complications

## 2012-02-02 NOTE — Anesthesia Postprocedure Evaluation (Signed)
  Anesthesia Post-op Note  Patient: Christine Eaton  Procedure(s) Performed: Procedure(s) (LRB) with comments: ARTHROSCOPY KNEE (Left) - ARTHROSCOPY KNEE WITH DEBRIDEMENT/SHAVING (CHONDROPLASTY) KNEE ARTHROSCOPY WITH MEDIAL MENISECTOMY (Left) - ARTHROSCOPY KNEE WITH MENISCECTOMY MEDIAL  Patient Location: PACU  Anesthesia Type:General  Level of Consciousness: awake, alert  and oriented  Airway and Oxygen Therapy: Patient Spontanous Breathing and Patient connected to face mask oxygen  Post-op Pain: mild  Post-op Assessment: Post-op Vital signs reviewed  Post-op Vital Signs: Reviewed  Complications: No apparent anesthesia complications

## 2012-02-03 ENCOUNTER — Encounter (HOSPITAL_BASED_OUTPATIENT_CLINIC_OR_DEPARTMENT_OTHER): Payer: Self-pay | Admitting: Orthopedic Surgery

## 2012-02-05 NOTE — Op Note (Signed)
[name], [name]              ACCOUNT NO.:  [id number]  MEDICAL RECORD NO.:  [id number]  LOCATION:                                 FACILITY:  PHYSICIAN:  M[place]field Dann A. Thurston Hole, M.D. DATE OF BIRTH:  05-08-1960  DATE OF PROCEDURE:  02/02/2012 DATE OF DISCHARGE:                              OPERATIVE REPORT   PREOPERATIVE DIAGNOSIS: 1. Left knee medial and lateral meniscal tears. 2. Left knee chondromalacia with synovitis.  POSTOPERATIVE DIAGNOSIS: 1. Left knee medial and lateral meniscal tears. 2. Left knee chondromalacia with synovitis.  PROCEDURE: 1. Left knee EUA followed by arthroscopic partial medial and lateral     meniscectomies. 2. Left knee chondroplasty with partial synovectomy.  SURGEON:  Elana Alm. Thurston Hole, M.D.  ASSISTANT:  Kirstin Shepperson, PA-C.  ANESTHESIA:  General.  OPERATIVE TIME:  40 minutes.  COMPLICATIONS:  None.  INDICATION FOR PROCEDURE:  [name] is a 51 year old woman who has had 6 months of increasing left knee pain with exam and MRI documenting meniscal tearing with chondromalacia and synovitis.  She has failed conservative care and is now to undergo arthroscopy.  DESCRIPTION:  [name] was brought to the operating room on February 02, 2012, after a knee block was placed in the holding room by Anesthesia.  She was placed on the operative table in a supine position. She received Ancef 3 g IV preoperatively for prophylaxis.  After being placed under general anesthesia, her left knee was examined.  She had full range of motion.  Knee was stable to ligamentous exam with normal patellar tracking.  Left leg was prepped using sterile DuraPrep and draped using sterile technique.  Time-out procedure was called, the correct left knee identified.  Initially, through an anterolateral portal, an arthroscope with a pump attached was placed into an anteromedial portal and an arthroscopic probe was placed.  On initial inspection, the articular cartilage  on the medial femoral condyle showed 75% grade 3 chondromalacia, which was debrided.  Medial meniscus showed tearing of the posterior medial horn of which 40% to 50% was resected back to a stable rim.  Intercondylar notch inspected.  Anterior and posterior cruciate ligaments were normal.  Lateral compartment showed 30% to 40% grade 3 chondromalacia in lateral tibial plateau, which was debrided.  Grade 1 and 2 changes lateral femoral condyle.  Lateral meniscus showed tearing of the posterior lateral horn of which 30% was resected back to a stable rim.  Patellofemoral joint showed 30% to 40% grade 3 chondromalacia on the patella, which was debrided.  Femoral groove articular cartilage was intact.  The patella tracked normally. Moderate synovitis, medial and lateral gutters were debrided, otherwise free of pathology.  After this was done, it was felt that all pathology had been satisfactorily addressed.  The instruments were removed. Portals closed with 3-0 nylon suture.  Sterile dressings were applied. The patient awakened, taken to recovery room in a stable condition.  FOLLOWUP CARE:  [name] will be followed as an outpatient on Norco for pain.  She will be seen back in the office in a week for sutures out and follow up.     Durante Violett A. Thurston Hole, M.D.     RAW/MEDQ  D:  02/02/2012  T:  02/03/2012  Job:  [id number]

## 2013-02-02 ENCOUNTER — Other Ambulatory Visit (HOSPITAL_COMMUNITY): Payer: Self-pay | Admitting: Internal Medicine

## 2013-02-02 DIAGNOSIS — Z1231 Encounter for screening mammogram for malignant neoplasm of breast: Secondary | ICD-10-CM

## 2013-02-23 ENCOUNTER — Ambulatory Visit (HOSPITAL_COMMUNITY)
Admission: RE | Admit: 2013-02-23 | Discharge: 2013-02-23 | Disposition: A | Discharge status: Home / Self Care | Payer: 59 | Source: Ambulatory Visit | Attending: Internal Medicine | Admitting: Internal Medicine

## 2013-02-23 DIAGNOSIS — Z1231 Encounter for screening mammogram for malignant neoplasm of breast: Secondary | ICD-10-CM

## 2013-07-11 ENCOUNTER — Other Ambulatory Visit (HOSPITAL_COMMUNITY): Payer: Self-pay | Admitting: Family Medicine

## 2013-07-11 ENCOUNTER — Ambulatory Visit (HOSPITAL_COMMUNITY)
Admission: RE | Admit: 2013-07-11 | Discharge: 2013-07-11 | Disposition: A | Discharge status: Home / Self Care | Payer: 59 | Source: Ambulatory Visit | Attending: Family Medicine | Admitting: Family Medicine

## 2013-07-11 DIAGNOSIS — M7989 Other specified soft tissue disorders: Secondary | ICD-10-CM | POA: Insufficient documentation

## 2013-07-11 DIAGNOSIS — M79609 Pain in unspecified limb: Secondary | ICD-10-CM | POA: Insufficient documentation

## 2013-07-11 DIAGNOSIS — R52 Pain, unspecified: Secondary | ICD-10-CM

## 2013-07-12 ENCOUNTER — Other Ambulatory Visit (HOSPITAL_COMMUNITY): Payer: Self-pay | Admitting: Family Medicine

## 2013-07-12 DIAGNOSIS — T148XXA Other injury of unspecified body region, initial encounter: Secondary | ICD-10-CM

## 2013-07-14 ENCOUNTER — Ambulatory Visit (HOSPITAL_COMMUNITY)
Admission: RE | Admit: 2013-07-14 | Discharge: 2013-07-14 | Disposition: A | Discharge status: Home / Self Care | Payer: PRIVATE HEALTH INSURANCE | Source: Ambulatory Visit | Attending: Family Medicine | Admitting: Family Medicine

## 2013-07-14 ENCOUNTER — Encounter (HOSPITAL_COMMUNITY): Payer: Self-pay

## 2013-07-14 DIAGNOSIS — M25469 Effusion, unspecified knee: Secondary | ICD-10-CM | POA: Insufficient documentation

## 2013-07-14 DIAGNOSIS — T148XXA Other injury of unspecified body region, initial encounter: Secondary | ICD-10-CM

## 2013-07-14 DIAGNOSIS — M259 Joint disorder, unspecified: Secondary | ICD-10-CM | POA: Insufficient documentation

## 2013-07-14 DIAGNOSIS — W19XXXA Unspecified fall, initial encounter: Secondary | ICD-10-CM | POA: Diagnosis not present

## 2013-07-14 DIAGNOSIS — R937 Abnormal findings on diagnostic imaging of other parts of musculoskeletal system: Secondary | ICD-10-CM | POA: Insufficient documentation

## 2013-07-14 DIAGNOSIS — IMO0002 Reserved for concepts with insufficient information to code with codable children: Secondary | ICD-10-CM | POA: Diagnosis not present

## 2013-09-05 ENCOUNTER — Ambulatory Visit: Payer: Self-pay

## 2013-09-05 ENCOUNTER — Ambulatory Visit (INDEPENDENT_AMBULATORY_CARE_PROVIDER_SITE_OTHER): Payer: 59

## 2013-09-05 VITALS — BP 121/87 | HR 105 | Resp 16

## 2013-09-05 DIAGNOSIS — L600 Ingrowing nail: Secondary | ICD-10-CM

## 2013-09-05 DIAGNOSIS — M79671 Pain in right foot: Secondary | ICD-10-CM

## 2013-09-05 DIAGNOSIS — M79672 Pain in left foot: Secondary | ICD-10-CM

## 2013-09-05 DIAGNOSIS — M722 Plantar fascial fibromatosis: Secondary | ICD-10-CM

## 2013-09-05 DIAGNOSIS — M79609 Pain in unspecified limb: Secondary | ICD-10-CM

## 2013-09-05 DIAGNOSIS — B070 Plantar wart: Secondary | ICD-10-CM

## 2013-09-05 MED ORDER — MELOXICAM 15 MG PO TABS: 15.0000 mg | ORAL_TABLET | Freq: Every day | ORAL | Status: DC

## 2013-09-05 NOTE — Progress Notes (Signed)
   Subjective:    Patient ID: Christine Eaton, female    DOB: 1960/07/26, 53 y.o.   MRN: [id number]  HPI Comments: N ingrown toenails L B/L 1st medial toenail borders D 2 years at least O on and off C painful, encurvated toenails A enclosed shoes T home surgery, neosporin ointment  Pt complains of B/L heel pain for 2 years worsening.  Pt states she has used OTC orthotics.  Pt states she has a plantar wart to the plantar left 1st intermetatarsal space for 5 years.  Pt states she has used Compound W.     Review of Systems  All other systems reviewed and are negative.      Objective:   Physical Exam Neurovascular status is intact with pedal pulses palpable DP and PT posterior were for Refill time 3 seconds all digits skin temperature warm turgor normal no edema rubor pallor or varicosities noted neurologically epicritic and proprioceptive sensations intact and symmetric bilateral is normal plantar response DTRs not listed neurologically skin color pigment and hair growth are normal there no open wounds or ulcerations there is a keratotic lesion sub-first MTP area left foot consistent with porokeratosis versus verruca plantaris dispensed instructions for topical salicylic acid application under occlusion with duct tape written instructions are given to patient this time. Patient also having pain inferior aspect of the heel right foot left more so than left x-rays demonstrate well-developed inferior calcaneal spur thickening plantar fascial structures no signs of fracture or other osseous abdomen on he noted pain on first up in the morning or getting up out of respiratory on for more than a year with right foot being more symptomatic. Patient's only other concern is dermatologically his this time pain discomfort medial borders of both hallux nails show some proptosis incurvation no secondary infections no discharge or drainage noted at this time.       Assessment & Plan:  Assessment #1 as  verruca plantaris will treetop is also casted as instructed  Assessment #2 plantar fasciitis/heel spur syndrome fascial strapping applied at this time prescription for [facility] is given also this time recommended ice to the area written instructions for plantar fasciitis are dispensed at this time. Recheck in 2-3 for followup indicates orthoses based on progress next  Assessment #3 ingrowing nails criptotic incurvated nails both hallux indicate for permanent nail excision with partial nail excision medial borders to be done under local anesthetic block is some point in the office for patient convenience followup for nail procedure when ready  We'll recheck in 2 weeks for plantar fasciitis may be K. for nail procedure at her convenience we'll monitor of the verruca for clearance within the next month  Harriet Masson DPM

## 2013-09-05 NOTE — Patient Instructions (Signed)

## 2013-09-20 ENCOUNTER — Telehealth: Payer: Self-pay | Admitting: *Deleted

## 2013-09-20 NOTE — Telephone Encounter (Signed)
I left a message 2 days ago.  Calling in reference to a stumped toe.  I hit my toe last Wednesday night.  It's black, blue and slightly swollen.  Wondering if I need to come in before my appointment on Friday?  Please give me a call back at your earliest convenience.  Leave me a message and I'll return your call.  I returned her call.  She said she has been buddy taping it, applying ice and soaking it.  She said it was painful over the weekend but the pain has subsided some now.  She asked if there is anything else she needed to be doing.  I told her no, she's pretty much doing everything that we would recommend.  I told her we will see her on Friday.  She said she just needed that confirmation that she was doing everything correctly.

## 2013-09-22 ENCOUNTER — Ambulatory Visit (INDEPENDENT_AMBULATORY_CARE_PROVIDER_SITE_OTHER): Payer: 59

## 2013-09-22 DIAGNOSIS — S92911A Unspecified fracture of right toe(s), initial encounter for closed fracture: Secondary | ICD-10-CM

## 2013-09-22 DIAGNOSIS — S8990XA Unspecified injury of unspecified lower leg, initial encounter: Secondary | ICD-10-CM

## 2013-09-22 DIAGNOSIS — S99919A Unspecified injury of unspecified ankle, initial encounter: Secondary | ICD-10-CM

## 2013-09-22 DIAGNOSIS — L600 Ingrowing nail: Secondary | ICD-10-CM

## 2013-09-22 DIAGNOSIS — B070 Plantar wart: Secondary | ICD-10-CM

## 2013-09-22 DIAGNOSIS — S99921A Unspecified injury of right foot, initial encounter: Secondary | ICD-10-CM

## 2013-09-22 DIAGNOSIS — S99929A Unspecified injury of unspecified foot, initial encounter: Secondary | ICD-10-CM

## 2013-09-22 DIAGNOSIS — S92919A Unspecified fracture of unspecified toe(s), initial encounter for closed fracture: Secondary | ICD-10-CM

## 2013-09-22 NOTE — Patient Instructions (Signed)
ICE INSTRUCTIONS  Apply ice or cold pack to the affected area at least 3 times a day for 10-15 minutes each time.  You should also use ice after prolonged activity or vigorous exercise.  Do not apply ice longer than 20 minutes at one time.  Always keep a cloth between your skin and the ice pack to prevent burns.  Being consistent and following these instructions will help control your symptoms.  We suggest you purchase a gel ice pack because they are reusable and do bit leak.  Some of them are designed to wrap around the area.  Use the method that works best for you.  Here are some other suggestions for icing.   Use a frozen bag of peas or corn-inexpensive and molds well to your body, usually stays frozen for 10 to 20 minutes.  Wet a towel with cold water and squeeze out the excess until it's damp.  Place in a bag in the freezer for 20 minutes. Then remove and use.  Apply ice to the foot 2 or 3 times daily if there is any aching or throbbing or pain. Also can use Advil or ibuprofen or Tylenol as needed for pain.  Maintain Coflex wrap in your body wrapping of toes in particular toes 34 and 5 of the right foot daily. Maintain wraps daily for 2-3 more weeks as instructed

## 2013-09-22 NOTE — Progress Notes (Signed)
   Subjective:    Patient ID: Christine Eaton, female    DOB: 1960/06/11, 53 y.o.   MRN: [id number]  HPI Comments: "The fasciitis is better. Wondering if he is going to fix the toes today, but I have injured my little toes"  Plantar Fasciitis - Follow up plantar heels bilateral   Ingrown Toenails - Possible AP nail procedure 1st toes bilateral - medial borders  New Problem - 4th/5th toes right   Patient c/o aching 4th and 5th toes right foot since last Wednesday . She injured them by stumping her toe on a pair of shoes in the floor. The forefoot is slightly swollen and discolored. She has the toes buddy splinted together. They do feel a little better than they did. She has been icing and soaking.       Review of Systems no new findings or systemic changes are noted at this time     Objective:   Physical Exam Neurovascular status is intact pedal pulses are palpable epicritic and proprioceptive sensations intact patient did have a slight skin tear she's been using the salicylic acid for treatment wart plantar left foot into the skin tears maintain Neosporin and Band-Aid dressing this was checked and noted to healing well. Patient is a new problem although she is here for AP nail procedures of her hallux bilateral 5 days ago she hit her right foot against tissue in her closet and felt a pop x-rays taken at this time confirm fracture of the proximal phalanx fourth digit right foot. Oblique fracture slightly displaced. No open wound ulceration no fragmentation. Mild edema noted tenderness on palpation over the digits appear to be relatively rectus.       Assessment & Plan:  Assessment this time new problem is a fracture fourth digit right foot proximal phalanx buddy wrap was applied patient is instructed in buddy wrap in toe also recommended ice and Advil as needed for pain. We'll defer the AP nail procedures for the next 3 or 4 weeks until the fracture is healed and followup appropriate  thereafter. Recheck in 3-4 weeks schedule AP nail procedure x2  Harriet Masson DPM

## 2013-09-25 ENCOUNTER — Telehealth: Payer: Self-pay | Admitting: *Deleted

## 2013-09-25 NOTE — Telephone Encounter (Signed)
I saw Dr. Blenda Mounts on the 19th.  I forgot what he told me take for the pain.  I informed her he suggested her to take Advil.  She stated that's all she needed to know.

## 2013-10-13 ENCOUNTER — Ambulatory Visit (INDEPENDENT_AMBULATORY_CARE_PROVIDER_SITE_OTHER): Payer: 59

## 2013-10-13 VITALS — BP 105/65 | HR 65 | Resp 18

## 2013-10-13 DIAGNOSIS — R52 Pain, unspecified: Secondary | ICD-10-CM

## 2013-10-13 DIAGNOSIS — S92919A Unspecified fracture of unspecified toe(s), initial encounter for closed fracture: Secondary | ICD-10-CM

## 2013-10-13 DIAGNOSIS — L600 Ingrowing nail: Secondary | ICD-10-CM

## 2013-10-13 DIAGNOSIS — S92911A Unspecified fracture of right toe(s), initial encounter for closed fracture: Secondary | ICD-10-CM

## 2013-10-13 NOTE — Patient Instructions (Signed)

## 2013-10-13 NOTE — Progress Notes (Signed)
° °  Subjective:    Patient ID: Christine Eaton, female    DOB: 01/10/1961, 53 y.o.   MRN: [id number]  HPI  They are doing better on the 4th and 5th toes on my right foot    Review of Systems no new findings or systemic changes noted     Objective:   Physical Exam  neurovascular status intact pedal pulses palpable patient fracture fourth toe x-rays taken at this time reveal consolidation occurring still consider fracture line and fourth digit proximal phalanx however minimal edemano pain no discomfort good clinical and radiographic alignment noted particular AP views oblique view reveals fracture line is still intact patient is a 70 shoe with ingrowing medial border right great toe no signs of infection however may have been addressed the future AP nail procedure at her convenience.     Assessment & Plan:   Assessment healing a resolving toe fracture fourth toe right foot dispensed and applied some additional Coflex wrap in maintain buddy splinting as needed for another week or 2. Discontinue the taping when ready followup in the future for AP nail procedure patient convenience discharge to an as-needed basis at this time  Harriet Masson DPM

## 2013-11-01 ENCOUNTER — Telehealth: Payer: Self-pay | Admitting: *Deleted

## 2013-11-01 NOTE — Telephone Encounter (Signed)
I called the patient and informed her she needs to be seen and have x-rays done.  She said I thought he might say that.  I asked if she wanted to make an appointment.  She stated yes.  I transferred her to a scheduler.

## 2013-11-01 NOTE — Telephone Encounter (Signed)
Calling in reference to my last visit with Dr. Blenda Mounts.  I broke my toe, he had released me.  I think I re-broke it.  Do I follow the same procedures as before?

## 2013-11-08 ENCOUNTER — Ambulatory Visit (INDEPENDENT_AMBULATORY_CARE_PROVIDER_SITE_OTHER): Payer: 59

## 2013-11-08 VITALS — BP 127/86 | HR 67 | Resp 16 | Ht 67.0 in | Wt 218.0 lb

## 2013-11-08 DIAGNOSIS — M79609 Pain in unspecified limb: Secondary | ICD-10-CM

## 2013-11-08 DIAGNOSIS — S92919A Unspecified fracture of unspecified toe(s), initial encounter for closed fracture: Secondary | ICD-10-CM

## 2013-11-08 DIAGNOSIS — S99919A Unspecified injury of unspecified ankle, initial encounter: Secondary | ICD-10-CM

## 2013-11-08 DIAGNOSIS — S92911A Unspecified fracture of right toe(s), initial encounter for closed fracture: Secondary | ICD-10-CM

## 2013-11-08 DIAGNOSIS — M79604 Pain in right leg: Secondary | ICD-10-CM

## 2013-11-08 DIAGNOSIS — Z5189 Encounter for other specified aftercare: Secondary | ICD-10-CM

## 2013-11-08 DIAGNOSIS — S99921D Unspecified injury of right foot, subsequent encounter: Secondary | ICD-10-CM

## 2013-11-08 DIAGNOSIS — IMO0002 Reserved for concepts with insufficient information to code with codable children: Secondary | ICD-10-CM

## 2013-11-08 DIAGNOSIS — S8990XA Unspecified injury of unspecified lower leg, initial encounter: Secondary | ICD-10-CM

## 2013-11-08 DIAGNOSIS — S90121S Contusion of right lesser toe(s) without damage to nail, sequela: Secondary | ICD-10-CM

## 2013-11-08 DIAGNOSIS — S99929A Unspecified injury of unspecified foot, initial encounter: Secondary | ICD-10-CM

## 2013-11-08 NOTE — Progress Notes (Signed)
   Subjective:    Patient ID: Christine Eaton, female    DOB: Jul 29, 1960, 53 y.o.   MRN: [id number]  HPI Comments: Pt states she feels she broke her right 4th toe while sleeping, because she heard a pop and now the area is painful again.  Pt states the incident happened about a week ago and she has begun to take the Ibuprofen again.     Review of Systems  All other systems reviewed and are negative.  no new findings or systemic changes noted     Objective:   Physical Exam Patient presents this time well-developed well-nourished oriented x3 for followup about 2 months ago she had an injury to contusion to the fourth toe right foot which is likely fracture minimally displaced the proximal phalanx fourth toe of the other night she felt something pop and had some increased pain and swelling of that same fourth toe. X-ray exam at this time reveals no new fracture the previous fracture with slight proxy 80% consolidated there was some slight telescoping or displacement although in general the hallux the digits in a straight position no misalignment no new fractures are noted patient may have extended the toe while sleeping in broke up some adhesions or scar tissue which may explain the popping she fell. Remainder of exam unremarkable neurovascular status is intact pedal pulses are palpable no wounds ulcerations no pain on direct palpation of the toe at this time. The swelling has since gone down again       Assessment & Plan:  Assessment followup of fracture fourth toe with possible sequela of adhesions. Cannot rule out contusion of toe while sleeping no new fractures are noted previous fracture consolidating well dispensed Coflex to maintain buddy wrap in of toes 34 and 5 discharge to an as-needed basis for future followup  Harriet Masson DPM

## 2013-11-08 NOTE — Patient Instructions (Signed)
ICE INSTRUCTIONS  Apply ice or cold pack to the affected area at least 3 times a day for 10-15 minutes each time.  You should also use ice after prolonged activity or vigorous exercise.  Do not apply ice longer than 20 minutes at one time.  Always keep a cloth between your skin and the ice pack to prevent burns.  Being consistent and following these instructions will help control your symptoms.  We suggest you purchase a gel ice pack because they are reusable and do bit leak.  Some of them are designed to wrap around the area.  Use the method that works best for you.  Here are some other suggestions for icing.   Use a frozen bag of peas or corn-inexpensive and molds well to your body, usually stays frozen for 10 to 20 minutes.  Wet a towel with cold water and squeeze out the excess until it's damp.  Place in a bag in the freezer for 20 minutes. Then remove and use.  Maintain Coflex wrap to prevent swelling and stabilize the toe

## 2014-03-12 ENCOUNTER — Other Ambulatory Visit (HOSPITAL_COMMUNITY): Payer: Self-pay | Admitting: Internal Medicine

## 2014-03-12 DIAGNOSIS — Z1231 Encounter for screening mammogram for malignant neoplasm of breast: Secondary | ICD-10-CM

## 2014-03-20 ENCOUNTER — Other Ambulatory Visit (HOSPITAL_COMMUNITY): Payer: Self-pay | Admitting: Internal Medicine

## 2014-03-20 ENCOUNTER — Ambulatory Visit (HOSPITAL_COMMUNITY)
Admission: RE | Admit: 2014-03-20 | Discharge: 2014-03-20 | Disposition: A | Discharge status: Home / Self Care | Payer: 59 | Source: Ambulatory Visit | Attending: Internal Medicine | Admitting: Internal Medicine

## 2014-03-20 DIAGNOSIS — Z1231 Encounter for screening mammogram for malignant neoplasm of breast: Secondary | ICD-10-CM | POA: Insufficient documentation

## 2014-05-29 ENCOUNTER — Ambulatory Visit: Payer: 59

## 2015-02-21 ENCOUNTER — Other Ambulatory Visit: Payer: Self-pay

## 2015-02-21 DIAGNOSIS — Z1231 Encounter for screening mammogram for malignant neoplasm of breast: Secondary | ICD-10-CM

## 2015-03-28 ENCOUNTER — Ambulatory Visit
Admission: RE | Admit: 2015-03-28 | Discharge: 2015-03-28 | Disposition: A | Discharge status: Home / Self Care | Payer: 59 | Source: Ambulatory Visit

## 2015-03-28 DIAGNOSIS — Z1231 Encounter for screening mammogram for malignant neoplasm of breast: Secondary | ICD-10-CM

## 2015-04-15 MED FILL — VALACYCLOVIR HCL 500 MG TAB: 500 | 30 days supply | Qty: 30 | Fill #7

## 2015-04-23 MED FILL — CHLORTHALIDONE 25 MG TABLET: 25 | 30 days supply | Qty: 30 | Fill #8

## 2015-04-29 DIAGNOSIS — R229 Localized swelling, mass and lump, unspecified: Secondary | ICD-10-CM | POA: Diagnosis not present

## 2015-04-29 DIAGNOSIS — I10 Essential (primary) hypertension: Secondary | ICD-10-CM | POA: Diagnosis not present

## 2015-04-30 DIAGNOSIS — H2513 Age-related nuclear cataract, bilateral: Secondary | ICD-10-CM | POA: Diagnosis not present

## 2015-04-30 DIAGNOSIS — H2511 Age-related nuclear cataract, right eye: Secondary | ICD-10-CM | POA: Diagnosis not present

## 2015-04-30 DIAGNOSIS — H2512 Age-related nuclear cataract, left eye: Secondary | ICD-10-CM | POA: Diagnosis not present

## 2015-05-06 MED FILL — KLOR-CON M10 TABLET: 10 | 60 days supply | Qty: 60 | Fill #0

## 2015-05-09 MED FILL — FLUCONAZOLE 150 MG TABLET: 150 | 1 days supply | Qty: 1 | Fill #0

## 2015-05-14 MED FILL — VALACYCLOVIR HCL 500 MG TAB: 500 | 30 days supply | Qty: 30 | Fill #8

## 2015-05-14 MED FILL — AMLODIPINE-VALSARTAN 10-320: 10-320 | 30 days supply | Qty: 30 | Fill #0

## 2015-05-16 DIAGNOSIS — R2231 Localized swelling, mass and lump, right upper limb: Secondary | ICD-10-CM | POA: Diagnosis not present

## 2015-05-21 DIAGNOSIS — M67441 Ganglion, right hand: Secondary | ICD-10-CM | POA: Insufficient documentation

## 2015-05-21 HISTORY — DX: Ganglion, right hand: M67.441

## 2015-06-10 MED FILL — VALACYCLOVIR HCL 500 MG TAB: 500 | 30 days supply | Qty: 30 | Fill #9

## 2015-06-10 MED FILL — AMLODIPINE-VALSARTAN 10-320: 10-320 | 30 days supply | Qty: 30 | Fill #1

## 2015-06-11 MED FILL — CHLORTHALIDONE 25 MG TABLET: 25 | 90 days supply | Qty: 90 | Fill #0

## 2015-06-19 ENCOUNTER — Ambulatory Visit (INDEPENDENT_AMBULATORY_CARE_PROVIDER_SITE_OTHER): Payer: 59 | Admitting: Podiatry

## 2015-06-19 ENCOUNTER — Encounter: Payer: Self-pay | Admitting: Podiatry

## 2015-06-19 VITALS — BP 113/72 | HR 67 | Resp 16

## 2015-06-19 DIAGNOSIS — L600 Ingrowing nail: Secondary | ICD-10-CM

## 2015-06-19 NOTE — Patient Instructions (Addendum)

## 2015-06-20 NOTE — Progress Notes (Signed)
Subjective:     Patient ID: Christine Eaton, female   DOB: October 05, 1960, 54 y.o.   MRN: [id number]  HPI patient states have a painful ingrown toenail of my left big toe on the inside and I have tried to trim it myself and soak it without relief and I have corns underneath my left foot   Review of Systems     Objective:   Physical Exam Neurovascular status intact muscle strength adequate with patient having a lifted distal hallux left with pain at the medial corner with incurvation of the nailbed with no drainage noted.    Assessment:     Abnormal position of the left big toe with ingrown toenail deformity left hallux medial border    Plan:     Conditions reviewed with patient and discussed removal of the corner. I explained the surgery and what would be required and all risk associated and she wants procedure and today I infiltrated 60 mg like Marcaine mixture remove the medial border exposed matrix and applied phenol 3 applications 30 seconds followed by alcohol lavage and sterile dressing. Gave instructions on soaks and reappoint and debrided lesions on the bottom of the left foot with no iatrogenic bleeding noted

## 2015-07-02 ENCOUNTER — Telehealth: Payer: Self-pay | Admitting: *Deleted

## 2015-07-02 NOTE — Telephone Encounter (Signed)
Left message for patient at [phone] (Home #) to check to see how they were doing from their ingrown toenail procedure that was performed on Wednesday, June 19, 2015. Waiting for a response.

## 2015-07-11 ENCOUNTER — Telehealth: Payer: Self-pay | Admitting: *Deleted

## 2015-07-11 NOTE — Telephone Encounter (Signed)
Pt states she had ingrown procedure 2 weeks ago and would like to know how it should look.  07/11/2015-I left message informing pt that at 2 weeks post ingrown procedure, her toe should have a decrease in redness, swelling and drainage from the surgery date, and she should continue the soaks at least once daily and cover with the antibiotic dressing and if possible and not in shoes or if she was able to rest she could allow the area to air dry, but she could stop the soaks if she had a dry hard scab over the area without redness, drainage or swelling.  I encouraged pt to call with concerns.

## 2015-07-16 MED FILL — AMLODIPINE-VALSARTAN 10-320: 10-320 | 30 days supply | Qty: 30 | Fill #2

## 2015-07-16 MED FILL — VALACYCLOVIR HCL 500 MG TAB: 500 | 30 days supply | Qty: 30 | Fill #0

## 2015-08-14 MED FILL — VALACYCLOVIR HCL 500 MG TAB: 500 | 30 days supply | Qty: 30 | Fill #1

## 2015-08-14 MED FILL — AMLODIPINE-VALSARTAN 10-320: 10-320 | 30 days supply | Qty: 30 | Fill #3

## 2015-08-21 ENCOUNTER — Ambulatory Visit: Payer: 59 | Admitting: Podiatry

## 2015-08-27 DIAGNOSIS — M67441 Ganglion, right hand: Secondary | ICD-10-CM | POA: Diagnosis not present

## 2015-09-04 ENCOUNTER — Ambulatory Visit (INDEPENDENT_AMBULATORY_CARE_PROVIDER_SITE_OTHER): Payer: 59 | Admitting: Podiatry

## 2015-09-04 DIAGNOSIS — L600 Ingrowing nail: Secondary | ICD-10-CM | POA: Diagnosis not present

## 2015-09-04 NOTE — Progress Notes (Signed)
Subjective:     Patient ID: Christine Eaton, female   DOB: 1960/08/22, 55 y.o.   MRN: [id number]  HPI patient presents stating that she has a painful ingrown on her right big toe that she wants to get fixed like the left that that once doing well   Review of Systems     Objective:   Physical Exam Neurovascular status intact with incurvated right hallux medial border that's painful when pressed and makes shoe gear difficult    Assessment:     Ingrown toenail deformity right hallux medial border with pain    Plan:     Recommended correction and explained procedure and risk and patient wants this done and today I infiltrated 60 mg like Marcaine mixture remove medial border exposed matrix and applied phenol 3 applications 30 seconds followed by alcohol lavage and sterile dressing. Gave instructions on soaks and reappoint

## 2015-09-04 NOTE — Patient Instructions (Signed)

## 2015-09-05 DIAGNOSIS — R2231 Localized swelling, mass and lump, right upper limb: Secondary | ICD-10-CM

## 2015-09-05 HISTORY — DX: Localized swelling, mass and lump, right upper limb: R22.31

## 2015-09-06 ENCOUNTER — Telehealth: Payer: Self-pay | Admitting: *Deleted

## 2015-09-06 NOTE — Telephone Encounter (Addendum)
Pt states she didn't get instructions for her post procedure care.  Left message to call for instructions. Pt called left [phone] cellphone.  I left message with the soaking instructions for antibacterial soap and neosporin for 4-6 weeks and may stop once she has a dry hard scab without drainage or redness, instructed to call with concerns.

## 2015-09-09 MED FILL — AMLODIPINE-VALSARTAN 10-320: 10-320 | 30 days supply | Qty: 30 | Fill #4

## 2015-09-10 ENCOUNTER — Telehealth: Payer: Self-pay | Admitting: *Deleted

## 2015-09-10 NOTE — Telephone Encounter (Signed)
Pt states Dr. Paulla Dolly treated a wart on her left toe and it is very painful. Pt states she trimmed the area and it feels better, denies drainage, swelling or redness.  I told pt she could apply neosporin with lidocaine to the area, after epsom salt soaks, and that she needed an appt for a follow up because often that wart treatment needs series of applications of the chemical.  Pt states understanding and is transferred to schedulers.

## 2015-09-11 ENCOUNTER — Ambulatory Visit (INDEPENDENT_AMBULATORY_CARE_PROVIDER_SITE_OTHER): Payer: 59 | Admitting: Podiatry

## 2015-09-11 ENCOUNTER — Encounter: Payer: Self-pay | Admitting: Podiatry

## 2015-09-11 DIAGNOSIS — M79672 Pain in left foot: Secondary | ICD-10-CM

## 2015-09-11 DIAGNOSIS — B070 Plantar wart: Secondary | ICD-10-CM

## 2015-09-12 ENCOUNTER — Other Ambulatory Visit: Payer: Self-pay | Admitting: Orthopedic Surgery

## 2015-09-12 DIAGNOSIS — B373 Candidiasis of vulva and vagina: Secondary | ICD-10-CM | POA: Diagnosis not present

## 2015-09-12 DIAGNOSIS — Z6835 Body mass index (BMI) 35.0-35.9, adult: Secondary | ICD-10-CM | POA: Diagnosis not present

## 2015-09-12 DIAGNOSIS — Z01419 Encounter for gynecological examination (general) (routine) without abnormal findings: Secondary | ICD-10-CM | POA: Diagnosis not present

## 2015-09-12 MED FILL — CHLORTHALIDONE 25 MG TABLET: 25 | 90 days supply | Qty: 90 | Fill #1

## 2015-09-12 MED FILL — FLUCONAZOLE 150 MG TABLET: 150 | 1 days supply | Qty: 2 | Fill #0

## 2015-09-12 MED FILL — CLOTRIMAZOLE-BETAMETHASONE: 1-0.05 | 7 days supply | Qty: 15 | Fill #0

## 2015-09-12 NOTE — Progress Notes (Signed)
Subjective:     Patient ID: Christine Eaton, female   DOB: Aug 23, 1960, 55 y.o.   MRN: [id number]  HPI patient states my ingrown is improving but I still have pain in the plantar of my left first metatarsal and it still is hard to walk on it times   Review of Systems     Objective:   Physical Exam Neurovascular status intact with ingrown toenail right hallux that's improving with lesions on the plantar aspect of the left first metatarsal that upon debridement continue to show pinpoint bleeding with moderate improvement from previous    Assessment:     Verruca plantaris plantar left    Plan:     Debrided the lesion and applied chemical agent to create an immune response and sterile dressing. Reappoint to recheck and continue soaking the right ingrown toenail

## 2015-09-25 MED FILL — VALACYCLOVIR HCL 500 MG TAB: 500 | 30 days supply | Qty: 30 | Fill #0

## 2015-09-26 ENCOUNTER — Encounter (HOSPITAL_BASED_OUTPATIENT_CLINIC_OR_DEPARTMENT_OTHER): Payer: Self-pay | Admitting: *Deleted

## 2015-09-26 NOTE — Pre-Procedure Instructions (Signed)
Will have BMET done at [facility]; to come for EKG

## 2015-09-30 ENCOUNTER — Other Ambulatory Visit: Payer: Self-pay

## 2015-10-01 MED FILL — POTASSIUM CL ER 10 MEQ TAB: 10 | 30 days supply | Qty: 60 | Fill #1

## 2015-10-02 ENCOUNTER — Ambulatory Visit (HOSPITAL_BASED_OUTPATIENT_CLINIC_OR_DEPARTMENT_OTHER): Payer: 59 | Admitting: Certified Registered"

## 2015-10-02 ENCOUNTER — Encounter (HOSPITAL_BASED_OUTPATIENT_CLINIC_OR_DEPARTMENT_OTHER): Payer: Self-pay | Admitting: Certified Registered"

## 2015-10-02 ENCOUNTER — Ambulatory Visit (HOSPITAL_BASED_OUTPATIENT_CLINIC_OR_DEPARTMENT_OTHER)
Admission: RE | Admit: 2015-10-02 | Discharge: 2015-10-02 | Disposition: A | Discharge status: Home / Self Care | Payer: 59 | Source: Ambulatory Visit | Attending: Orthopedic Surgery | Admitting: Orthopedic Surgery

## 2015-10-02 ENCOUNTER — Encounter (HOSPITAL_BASED_OUTPATIENT_CLINIC_OR_DEPARTMENT_OTHER)
Admission: RE | Disposition: A | Discharge status: Home / Self Care | Payer: Self-pay | Source: Ambulatory Visit | Attending: Orthopedic Surgery

## 2015-10-02 DIAGNOSIS — I10 Essential (primary) hypertension: Secondary | ICD-10-CM | POA: Diagnosis not present

## 2015-10-02 DIAGNOSIS — Z6834 Body mass index (BMI) 34.0-34.9, adult: Secondary | ICD-10-CM | POA: Diagnosis not present

## 2015-10-02 DIAGNOSIS — D179 Benign lipomatous neoplasm, unspecified: Secondary | ICD-10-CM | POA: Diagnosis not present

## 2015-10-02 DIAGNOSIS — Z79899 Other long term (current) drug therapy: Secondary | ICD-10-CM | POA: Diagnosis not present

## 2015-10-02 DIAGNOSIS — R2231 Localized swelling, mass and lump, right upper limb: Secondary | ICD-10-CM | POA: Diagnosis not present

## 2015-10-02 HISTORY — PX: MASS EXCISION: SHX2000

## 2015-10-02 HISTORY — DX: Localized swelling, mass and lump, right upper limb: R22.31

## 2015-10-02 LAB — POCT I-STAT, CHEM 8
BUN: 18 mg/dL (ref 6–20)
Calcium, Ion: 1.17 mmol/L (ref 1.13–1.30)
Chloride: 100 mmol/L — ABNORMAL LOW (ref 101–111)
Creatinine, Ser: 0.6 mg/dL (ref 0.44–1.00)
Glucose, Bld: 97 mg/dL (ref 65–99)
HCT: 40 % (ref 36.0–46.0)
Hemoglobin: 13.6 g/dL (ref 12.0–15.0)
Potassium: 2.9 mmol/L — ABNORMAL LOW (ref 3.5–5.1)
Sodium: 139 mmol/L (ref 135–145)
TCO2: 26 mmol/L (ref 0–100)

## 2015-10-02 SURGERY — EXCISION MASS
Anesthesia: General | Site: Hand | Laterality: Right

## 2015-10-02 MED ORDER — CEFAZOLIN SODIUM-DEXTROSE 2-4 GM/100ML-% IV SOLN
INTRAVENOUS | Status: AC
  Filled 2015-10-02: qty 100

## 2015-10-02 MED ORDER — LIDOCAINE 2% (20 MG/ML) 5 ML SYRINGE
INTRAMUSCULAR | Status: DC | PRN
  Administered 2015-10-02: 80 mg via INTRAVENOUS

## 2015-10-02 MED ORDER — SCOPOLAMINE 1 MG/3DAYS TD PT72: 1.0000 | MEDICATED_PATCH | Freq: Once | TRANSDERMAL | Status: DC | PRN

## 2015-10-02 MED ORDER — ONDANSETRON HCL 4 MG/2ML IJ SOLN
INTRAMUSCULAR | Status: AC
  Filled 2015-10-02: qty 2

## 2015-10-02 MED ORDER — DEXAMETHASONE SODIUM PHOSPHATE 10 MG/ML IJ SOLN
INTRAMUSCULAR | Status: AC
  Filled 2015-10-02: qty 1

## 2015-10-02 MED ORDER — FENTANYL CITRATE (PF) 100 MCG/2ML IJ SOLN
INTRAMUSCULAR | Status: AC
  Filled 2015-10-02: qty 2

## 2015-10-02 MED ORDER — OXYCODONE HCL 5 MG PO TABS
5.0000 mg | ORAL_TABLET | Freq: Once | ORAL | Status: AC
  Administered 2015-10-02: 5 mg via ORAL

## 2015-10-02 MED ORDER — MIDAZOLAM HCL 2 MG/2ML IJ SOLN
INTRAMUSCULAR | Status: AC
  Filled 2015-10-02: qty 2

## 2015-10-02 MED ORDER — FENTANYL CITRATE (PF) 100 MCG/2ML IJ SOLN
25.0000 ug | INTRAMUSCULAR | Status: DC | PRN
  Administered 2015-10-02: 50 ug via INTRAVENOUS

## 2015-10-02 MED ORDER — DEXAMETHASONE SODIUM PHOSPHATE 10 MG/ML IJ SOLN
INTRAMUSCULAR | Status: DC | PRN
  Administered 2015-10-02: 10 mg via INTRAVENOUS

## 2015-10-02 MED ORDER — OXYCODONE-ACETAMINOPHEN 5-325 MG PO TABS: 1.0000 | ORAL_TABLET | ORAL | Status: DC | PRN

## 2015-10-02 MED ORDER — PROPOFOL 10 MG/ML IV BOLUS
INTRAVENOUS | Status: DC | PRN
  Administered 2015-10-02: 40 mg via INTRAVENOUS
  Administered 2015-10-02: 200 mg via INTRAVENOUS

## 2015-10-02 MED ORDER — MIDAZOLAM HCL 2 MG/2ML IJ SOLN
1.0000 mg | INTRAMUSCULAR | Status: DC | PRN
  Administered 2015-10-02: 1 mg via INTRAVENOUS

## 2015-10-02 MED ORDER — LACTATED RINGERS IV SOLN
INTRAVENOUS | Status: DC
  Administered 2015-10-02: 08:00:00 via INTRAVENOUS

## 2015-10-02 MED ORDER — GLYCOPYRROLATE 0.2 MG/ML IJ SOLN: 0.2000 mg | Freq: Once | INTRAMUSCULAR | Status: DC | PRN

## 2015-10-02 MED ORDER — BUPIVACAINE HCL (PF) 0.5 % IJ SOLN
INTRAMUSCULAR | Status: DC | PRN
  Administered 2015-10-02: 10 mL

## 2015-10-02 MED ORDER — CHLORHEXIDINE GLUCONATE 4 % EX LIQD: 60.0000 mL | Freq: Once | CUTANEOUS | Status: DC

## 2015-10-02 MED ORDER — OXYCODONE HCL 5 MG PO TABS
ORAL_TABLET | ORAL | Status: AC
  Filled 2015-10-02: qty 1

## 2015-10-02 MED ORDER — BUPIVACAINE HCL (PF) 0.25 % IJ SOLN
INTRAMUSCULAR | Status: AC
  Filled 2015-10-02: qty 30

## 2015-10-02 MED ORDER — FENTANYL CITRATE (PF) 100 MCG/2ML IJ SOLN
50.0000 ug | INTRAMUSCULAR | Status: AC | PRN
  Administered 2015-10-02: 50 ug via INTRAVENOUS
  Administered 2015-10-02: 100 ug via INTRAVENOUS
  Administered 2015-10-02: 50 ug via INTRAVENOUS

## 2015-10-02 MED ORDER — PROPOFOL 500 MG/50ML IV EMUL
INTRAVENOUS | Status: AC
  Filled 2015-10-02: qty 50

## 2015-10-02 MED ORDER — ATROPINE SULFATE 0.4 MG/ML IJ SOLN
INTRAMUSCULAR | Status: AC
  Filled 2015-10-02: qty 1

## 2015-10-02 MED ORDER — CEFAZOLIN SODIUM-DEXTROSE 2-4 GM/100ML-% IV SOLN
2.0000 g | INTRAVENOUS | Status: AC
  Administered 2015-10-02: 2 g via INTRAVENOUS

## 2015-10-02 MED ORDER — ONDANSETRON HCL 4 MG/2ML IJ SOLN
INTRAMUSCULAR | Status: DC | PRN
  Administered 2015-10-02: 4 mg via INTRAVENOUS

## 2015-10-02 MED ORDER — MEPERIDINE HCL 25 MG/ML IJ SOLN: 6.2500 mg | INTRAMUSCULAR | Status: DC | PRN

## 2015-10-02 MED ORDER — LIDOCAINE 2% (20 MG/ML) 5 ML SYRINGE
INTRAMUSCULAR | Status: AC
  Filled 2015-10-02: qty 5

## 2015-10-02 MED FILL — OXYCODONE/APAP 5-325: 5-325 | 5 days supply | Qty: 30 | Fill #0

## 2015-10-02 SURGICAL SUPPLY — 48 items
APL SKNCLS STERI-STRIP NONHPOA (GAUZE/BANDAGES/DRESSINGS) ×1
BAG DECANTER FOR FLEXI CONT (MISCELLANEOUS) IMPLANT
BANDAGE ACE 3X5.8 VEL STRL LF (GAUZE/BANDAGES/DRESSINGS) IMPLANT
BANDAGE ACE 4X5 VEL STRL LF (GAUZE/BANDAGES/DRESSINGS) ×2 IMPLANT
BENZOIN TINCTURE PRP APPL 2/3 (GAUZE/BANDAGES/DRESSINGS) ×1 IMPLANT
BLADE SURG 15 STRL LF DISP TIS (BLADE) ×1 IMPLANT
BLADE SURG 15 STRL SS (BLADE) ×2
BNDG CMPR 9X4 STRL LF SNTH (GAUZE/BANDAGES/DRESSINGS) ×1
BNDG ESMARK 4X9 LF (GAUZE/BANDAGES/DRESSINGS) ×1 IMPLANT
BNDG GAUZE ELAST 4 BULKY (GAUZE/BANDAGES/DRESSINGS) ×2 IMPLANT
CORDS BIPOLAR (ELECTRODE) ×2 IMPLANT
COVER BACK TABLE 60X90IN (DRAPES) ×2 IMPLANT
CUFF TOURNIQUET SINGLE 18IN (TOURNIQUET CUFF) ×1 IMPLANT
DECANTER SPIKE VIAL GLASS SM (MISCELLANEOUS) IMPLANT
DRAPE EXTREMITY T 121X128X90 (DRAPE) ×2 IMPLANT
DRAPE SURG 17X23 STRL (DRAPES) ×2 IMPLANT
DURAPREP 26ML APPLICATOR (WOUND CARE) ×2 IMPLANT
GAUZE SPONGE 4X4 12PLY STRL (GAUZE/BANDAGES/DRESSINGS) ×2 IMPLANT
GAUZE XEROFORM 1X8 LF (GAUZE/BANDAGES/DRESSINGS) IMPLANT
GLOVE BIOGEL PI IND STRL 7.0 (GLOVE) IMPLANT
GLOVE BIOGEL PI INDICATOR 7.0 (GLOVE) ×1
GLOVE SURG SS PI 6.5 STRL IVOR (GLOVE) ×1 IMPLANT
GLOVE SURG SYN 8.0 (GLOVE) ×4 IMPLANT
GLOVE SURG SYN 8.0 PF PI (GLOVE) ×2 IMPLANT
GOWN STRL REUS W/ TWL LRG LVL3 (GOWN DISPOSABLE) ×1 IMPLANT
GOWN STRL REUS W/TWL LRG LVL3 (GOWN DISPOSABLE) ×2
GOWN STRL REUS W/TWL XL LVL3 (GOWN DISPOSABLE) ×4 IMPLANT
NDL HYPO 25X1 1.5 SAFETY (NEEDLE) IMPLANT
NEEDLE HYPO 25X1 1.5 SAFETY (NEEDLE) ×2 IMPLANT
NS IRRIG 1000ML POUR BTL (IV SOLUTION) ×2 IMPLANT
PACK BASIN DAY SURGERY FS (CUSTOM PROCEDURE TRAY) ×2 IMPLANT
PAD CAST 3X4 CTTN HI CHSV (CAST SUPPLIES) ×1 IMPLANT
PADDING CAST COTTON 3X4 STRL (CAST SUPPLIES) ×2
SHEET MEDIUM DRAPE 40X70 STRL (DRAPES) ×2 IMPLANT
SPLINT PLASTER CAST XFAST 4X15 (CAST SUPPLIES) ×5 IMPLANT
SPLINT PLASTER XTRA FAST SET 4 (CAST SUPPLIES) ×5
STOCKINETTE 4X48 STRL (DRAPES) ×2 IMPLANT
STRIP CLOSURE SKIN 1/2X4 (GAUZE/BANDAGES/DRESSINGS) IMPLANT
SUT ETHILON 5 0 PS 2 18 (SUTURE) IMPLANT
SUT PROLENE 3 0 PS 2 (SUTURE) ×1 IMPLANT
SUT VIC AB 4-0 P-3 18XBRD (SUTURE) IMPLANT
SUT VIC AB 4-0 P3 18 (SUTURE) ×2
SUT VICRYL RAPIDE 4-0 (SUTURE) IMPLANT
SUT VICRYL RAPIDE 4/0 PS 2 (SUTURE) IMPLANT
SYR BULB 3OZ (MISCELLANEOUS) ×2 IMPLANT
SYRINGE 10CC LL (SYRINGE) IMPLANT
TOWEL OR 17X24 6PK STRL BLUE (TOWEL DISPOSABLE) ×2 IMPLANT
UNDERPAD 30X30 (UNDERPADS AND DIAPERS) ×2 IMPLANT

## 2015-10-02 NOTE — Anesthesia Postprocedure Evaluation (Signed)
Anesthesia Post Note  Patient: Christine Eaton  Procedure(s) Performed: Procedure(s) (LRB): EXCISION OF RIGHT HAND  MASS (Right)  Patient location during evaluation: PACU Anesthesia Type: General Level of consciousness: awake and alert Pain management: pain level controlled Vital Signs Assessment: post-procedure vital signs reviewed and stable Respiratory status: spontaneous breathing, nonlabored ventilation and respiratory function stable Cardiovascular status: blood pressure returned to baseline and stable Postop Assessment: no signs of nausea or vomiting Anesthetic complications: no    Last Vitals:  Filed Vitals:   10/02/15 0930 10/02/15 1005  BP: 108/74 121/62  Pulse: 63 70  Temp:  36.6 C  Resp: 14 18    Last Pain:  Filed Vitals:   10/02/15 1007  PainSc: 2                  Manroop Jakubowicz A

## 2015-10-02 NOTE — Anesthesia Preprocedure Evaluation (Addendum)
Anesthesia Evaluation  Patient identified by MRN, date of birth, ID band Patient awake    Reviewed: Allergy & Precautions, NPO status , Patient's Chart, lab work & pertinent test results  Airway Mallampati: I  TM Distance: >3 FB Neck ROM: Full    Dental  (+) Teeth Intact, Dental Advisory Given   Pulmonary    breath sounds clear to auscultation       Cardiovascular hypertension, Pt. on medications  Rhythm:Regular Rate:Normal     Neuro/Psych    GI/Hepatic   Endo/Other  Morbid obesity  Renal/GU      Musculoskeletal   Abdominal   Peds  Hematology   Anesthesia Other Findings   Reproductive/Obstetrics                            Anesthesia Physical Anesthesia Plan  ASA: II  Anesthesia Plan: General   Post-op Pain Management:    Induction: Intravenous  Airway Management Planned: LMA  Additional Equipment:   Intra-op Plan:   Post-operative Plan: Extubation in OR  Informed Consent: I have reviewed the patients History and Physical, chart, labs and discussed the procedure including the risks, benefits and alternatives for the proposed anesthesia with the patient or authorized representative who has indicated his/her understanding and acceptance.   Dental advisory given  Plan Discussed with: CRNA, Anesthesiologist and Surgeon  Anesthesia Plan Comments:         Anesthesia Quick Evaluation

## 2015-10-02 NOTE — Discharge Instructions (Signed)

## 2015-10-02 NOTE — Op Note (Signed)
See note [phone]

## 2015-10-02 NOTE — Op Note (Signed)
Christine Eaton, Christine Eaton              ACCOUNT NO.:  [id number]  MEDICAL RECORD NO.:  [id number]  LOCATION:                                 FACILITY:  PHYSICIAN:  Sheral Apley. Ramisa Duman, M.D.DATE OF BIRTH:  [birth year]  DATE OF PROCEDURE:  10/02/2015 DATE OF DISCHARGE:                              OPERATIVE REPORT   PREOPERATIVE DIAGNOSIS:  Painless enlarging mass, right hand, thumb and index web space.  POSTOPERATIVE DIAGNOSIS:  Painless enlarging mass, right hand, thumb and index web space.  PROCEDURE:  Excision of intramuscular lipoma approximately 5 x 4 cm from the first dorsal interosseous muscle, right hand.  SURGEON:  Sheral Apley. Burney Gauze, M.D.  ASSISTANT:  None.  ANESTHESIA:  General.  TOURNIQUET TIME:  25 minutes.  COMPLICATIONS:  No complication.  DRAINS:  No drains.  SPECIMEN:  One specimen sent.  DESCRIPTION OF PROCEDURE:  The patient was taken to the operating suite. After induction of adequate general anesthetic, right upper extremity was prepped and draped in sterile fashion.  An Esmarch was used to exsanguinate the limb.  Tourniquet was inflated to 250 mmHg.  At this point in time, the skin was incised longitudinally in the web space between the index and thumb on the right hand along the radial shaft of the index metacarpal.  Skin was incised approximately 5-6 cm. Dissection was carried down to the first dorsal interosseous muscle.  We carefully dissected the fascia overlying this and dissected down to a lipomatous multilobulated lesion starting at the first dorsal interosseous muscle and going toward the anatomic snuffbox and carefully identified and retracted saphenous vein branches and superficial radial nerve branches.  We also identified the snuffbox already and retracted it.  We then removed the mass in its entirety.  The wound was thoroughly irrigated.  Hemostasis was achieved with bipolar cautery and was loosely closed with 4-0 Vicryl and a 3-0  Prolene subcuticular stitch.  Steri-Strips, 4x4s, fluffs, and a compressive hand dressing were applied.  The patient tolerated the procedure well and went to recovery room in stable fashion.     Sheral Apley Burney Gauze, M.D.   ______________________________ Sheral Apley. Burney Gauze, M.D.    MAW/MEDQ  D:  10/02/2015  T:  10/02/2015  Job:  [id number]

## 2015-10-02 NOTE — Anesthesia Procedure Notes (Signed)
Procedure Name: LMA Insertion Date/Time: 10/02/2015 8:34 AM Performed by: Baxter Flattery Pre-anesthesia Checklist: Patient identified, Emergency Drugs available, Suction available and Patient being monitored Patient Re-evaluated:Patient Re-evaluated prior to inductionOxygen Delivery Method: Circle system utilized Preoxygenation: Pre-oxygenation with 100% oxygen Intubation Type: IV induction Ventilation: Mask ventilation without difficulty LMA: LMA inserted LMA Size: 4.0 Number of attempts: 1 Airway Equipment and Method: Bite block Placement Confirmation: positive ETCO2 and breath sounds checked- equal and bilateral Tube secured with: Tape Dental Injury: Teeth and Oropharynx as per pre-operative assessment

## 2015-10-02 NOTE — Transfer of Care (Signed)
Immediate Anesthesia Transfer of Care Note  Patient: Christine Eaton  Procedure(s) Performed: Procedure(s): EXCISION OF RIGHT HAND  MASS (Right)  Patient Location: PACU  Anesthesia Type:General  Level of Consciousness: awake, alert  and patient cooperative  Airway & Oxygen Therapy: Patient Spontanous Breathing and Patient connected to face mask oxygen  Post-op Assessment: Report given to RN, Post -op Vital signs reviewed and stable and Patient moving all extremities  Post vital signs: Reviewed and stable  Last Vitals:  Filed Vitals:   10/02/15 0711  BP: 104/71  Pulse: 67  Temp: 37.1 C  Resp: 20    Last Pain: There were no vitals filed for this visit.       Complications: No apparent anesthesia complications

## 2015-10-02 NOTE — H&P (Signed)
Christine Eaton is an 55 y.o. female.   Chief Complaint: right hand mass HPI: as above with slowly growing painless mass in right hand thumb index webspace  Past Medical History  Diagnosis Date  . Hypertension     states under control with meds., has been on med. > 15 yr.  . Mass of right hand 09/2015    Past Surgical History  Procedure Laterality Date  . Colonoscopy  06/25/2011    Procedure: COLONOSCOPY;  Surgeon: Garlan Fair, MD;  Location: [facility];  Service: Endoscopy;  Laterality: N/A;  MAC  . Knee arthroscopy  02/02/2012    Procedure: ARTHROSCOPY KNEE;  Surgeon: Lorn Junes, MD;  Location: [facility];  Service: Orthopedics;  Laterality: Left;  ARTHROSCOPY KNEE WITH DEBRIDEMENT/SHAVING (CHONDROPLASTY)  . Inguinal hernia repair Left   . Repair vaginal cuff  06/18/2007  . Robotic assisted total hysterectomy with bilateral salpingo oopherectomy  05/24/2007  . Laparoscopic lysis of adhesions  04/15/2001; 05/24/2007  . Chromopertubation  04/15/2001  . Excision of endometrioma Right 04/15/2001    right tube  . Breast mass excision Right 08/23/2003    Family History  Problem Relation Age of Onset  . Colon cancer Neg Hx   . Hypertension Mother   . Cancer Mother     OVARIAN  . Diabetes Father   . Cancer Father     LUNG  . Hypertension Maternal Aunt   . Hypertension Maternal Uncle   . Hypertension Maternal Aunt    Social History:  reports that she has never smoked. She has never used smokeless tobacco. She reports that she drinks alcohol. She reports that she does not use illicit drugs.  Allergies:  Allergies  Allergen Reactions  . Lavender Oil Hives    Medications Prior to Admission  Medication Sig Dispense Refill  . amLODipine-valsartan (EXFORGE) 10-320 MG per tablet Take 1 tablet by mouth daily.    . chlorthalidone (HYGROTON) 25 MG tablet Take 25 mg by mouth daily.    . cholecalciferol (VITAMIN D) 1000 units tablet Take 1,000 Units by mouth daily.     . Multiple Vitamin (MULTIVITAMIN) tablet Take 1 tablet by mouth daily.      No results found for this or any previous visit (from the past 48 hour(s)). No results found.  Review of Systems  All other systems reviewed and are negative.   Blood pressure 104/71, pulse 67, temperature 98.7 F (37.1 C), temperature source Oral, resp. rate 20, height 5\' 7"  (1.702 m), weight 100.336 kg (221 lb 3.2 oz), SpO2 99 %. Physical Exam  Constitutional: She is oriented to person, place, and time. She appears well-developed and well-nourished.  HENT:  Head: Normocephalic and atraumatic.  Neck: Normal range of motion.  Cardiovascular: Normal rate.   Respiratory: Effort normal.  Musculoskeletal:  5x6 cm webspace mass with no pain  Neurological: She is alert and oriented to person, place, and time.  Skin: Skin is warm.  Psychiatric: She has a normal mood and affect. Her behavior is normal. Judgment and thought content normal.     Assessment/Plan As above  Plan excision of above  Charlotte Crumb A, MD 10/02/2015, 8:19 AM

## 2015-10-02 NOTE — Progress Notes (Signed)
62ml of fentanyl wasted in trash. Unable to waste in pyxis. Shara Blazing, RN witness

## 2015-10-04 ENCOUNTER — Encounter (HOSPITAL_BASED_OUTPATIENT_CLINIC_OR_DEPARTMENT_OTHER): Payer: Self-pay | Admitting: Orthopedic Surgery

## 2015-10-21 DIAGNOSIS — M549 Dorsalgia, unspecified: Secondary | ICD-10-CM | POA: Diagnosis not present

## 2015-10-21 MED FILL — CYCLOBENZAPRINE 10 MG TAB: 10 | 7 days supply | Qty: 20 | Fill #0

## 2015-10-29 MED FILL — POTASSIUM CL ER 10 MEQ TAB: 10 | 30 days supply | Qty: 60 | Fill #2

## 2015-10-29 MED FILL — VALACYCLOVIR HCL 500 MG TAB: 500 | 30 days supply | Qty: 30 | Fill #1

## 2015-11-18 MED FILL — AMLODIPINE-VALSARTAN 10-320: 10-320 | 30 days supply | Qty: 30 | Fill #5

## 2015-12-03 MED FILL — VALACYCLOVIR HCL 500 MG TAB: 500 | 30 days supply | Qty: 30 | Fill #0

## 2015-12-18 MED FILL — CHLORTHALIDONE 25 MG TABLET: 25 | 90 days supply | Qty: 90 | Fill #2

## 2015-12-18 MED FILL — AMLODIPINE-VALSARTAN 10-320: 10-320 | 30 days supply | Qty: 30 | Fill #6

## 2016-01-02 MED FILL — VALACYCLOVIR HCL 500 MG TAB: 500 | 30 days supply | Qty: 30 | Fill #1

## 2016-01-07 MED FILL — POTASSIUM CL 10 MEQ TAB SA: 10 | 30 days supply | Qty: 60 | Fill #3

## 2016-01-08 ENCOUNTER — Telehealth: Payer: Self-pay | Admitting: *Deleted

## 2016-01-08 NOTE — Telephone Encounter (Signed)
Pt states she is still having pain in the foot Dr. Paulla Dolly performed a procedure and would like to schedule an appt.

## 2016-01-09 DIAGNOSIS — H6502 Acute serous otitis media, left ear: Secondary | ICD-10-CM | POA: Diagnosis not present

## 2016-01-09 DIAGNOSIS — J209 Acute bronchitis, unspecified: Secondary | ICD-10-CM | POA: Diagnosis not present

## 2016-01-09 MED FILL — AZITHROMYCIN 250 MG TABLET: 250 | 5 days supply | Qty: 6 | Fill #0

## 2016-01-09 MED FILL — predniSONE 10 MG TABS: 10 | 5 days supply | Qty: 21 | Fill #0

## 2016-01-10 NOTE — Telephone Encounter (Signed)
I called and spoke with pt. She is presently ill, she will callback when shes feeling better to schedule an appt.

## 2016-01-18 MED FILL — AMLODIPINE-VALSARTAN 10-320: 10-320 | 30 days supply | Qty: 30 | Fill #7

## 2016-01-23 ENCOUNTER — Encounter: Payer: Self-pay | Admitting: Podiatry

## 2016-01-23 ENCOUNTER — Ambulatory Visit (INDEPENDENT_AMBULATORY_CARE_PROVIDER_SITE_OTHER): Payer: 59 | Admitting: Podiatry

## 2016-01-23 VITALS — BP 107/73 | HR 60 | Resp 16

## 2016-01-23 DIAGNOSIS — B070 Plantar wart: Secondary | ICD-10-CM | POA: Diagnosis not present

## 2016-01-29 NOTE — Progress Notes (Signed)
Subjective:     Patient ID: Christine Eaton, female   DOB: 10/03/1960, 55 y.o.   MRN: [id number]  HPI patient states she still having some pain in her left foot   Review of Systems     Objective:   Physical Exam Neurovascular status intact muscle strength adequate with continued discomfort left that's improving but present    Assessment:     Verruca plantaris which is improved but still present    Plan:     Debrided the lesion and found there still a small amount of lesion formation and went ahead and applied chemical agent to create an immune response

## 2016-02-14 ENCOUNTER — Other Ambulatory Visit: Payer: Self-pay | Admitting: Internal Medicine

## 2016-02-14 DIAGNOSIS — Z1231 Encounter for screening mammogram for malignant neoplasm of breast: Secondary | ICD-10-CM

## 2016-02-14 MED FILL — VALACYCLOVIR HCL 500 MG TAB: 500 | 30 days supply | Qty: 30 | Fill #0

## 2016-02-18 DIAGNOSIS — R102 Pelvic and perineal pain: Secondary | ICD-10-CM | POA: Diagnosis not present

## 2016-02-18 DIAGNOSIS — N764 Abscess of vulva: Secondary | ICD-10-CM | POA: Diagnosis not present

## 2016-02-18 DIAGNOSIS — A609 Anogenital herpesviral infection, unspecified: Secondary | ICD-10-CM | POA: Diagnosis not present

## 2016-02-18 MED FILL — CEPHALEXIN 500 MG CAPSULE: 500 | 5 days supply | Qty: 20 | Fill #0

## 2016-02-20 DIAGNOSIS — E669 Obesity, unspecified: Secondary | ICD-10-CM | POA: Diagnosis not present

## 2016-02-20 DIAGNOSIS — I10 Essential (primary) hypertension: Secondary | ICD-10-CM | POA: Diagnosis not present

## 2016-02-20 DIAGNOSIS — J209 Acute bronchitis, unspecified: Secondary | ICD-10-CM | POA: Diagnosis not present

## 2016-02-20 DIAGNOSIS — Z0000 Encounter for general adult medical examination without abnormal findings: Secondary | ICD-10-CM | POA: Diagnosis not present

## 2016-02-20 DIAGNOSIS — Z6836 Body mass index (BMI) 36.0-36.9, adult: Secondary | ICD-10-CM | POA: Diagnosis not present

## 2016-03-01 MED FILL — AMLODIPINE-VALSARTAN 10-320: 10-320 | 30 days supply | Qty: 30 | Fill #8

## 2016-03-05 MED FILL — VALACYCLOVIR HCL 500 MG TAB: 500 | 90 days supply | Qty: 90 | Fill #0

## 2016-03-15 MED FILL — POTASSIUM CL 10 MEQ TAB SA: 10 | 30 days supply | Qty: 60 | Fill #4

## 2016-03-31 ENCOUNTER — Ambulatory Visit
Admission: RE | Admit: 2016-03-31 | Discharge: 2016-03-31 | Disposition: A | Discharge status: Home / Self Care | Payer: 59 | Source: Ambulatory Visit | Attending: Internal Medicine | Admitting: Internal Medicine

## 2016-03-31 DIAGNOSIS — Z1231 Encounter for screening mammogram for malignant neoplasm of breast: Secondary | ICD-10-CM

## 2016-04-09 MED FILL — AMLODIPINE-VALSARTAN 10-320: 10-320 | 30 days supply | Qty: 30 | Fill #9

## 2016-04-09 MED FILL — CHLORTHALIDONE 25 MG TABLET: 25 | 90 days supply | Qty: 90 | Fill #3

## 2016-05-18 MED FILL — AMLODIPINE-VALSARTAN 10-320: 10-320 | 30 days supply | Qty: 30 | Fill #0

## 2016-05-26 MED FILL — NAPROXEN SODIUM 550 MG TAB: 550 | 6 days supply | Qty: 12 | Fill #0

## 2016-05-26 MED FILL — CHLORHEXIDINE 0.12% RINSE: 0.12 | 17 days supply | Qty: 473 | Fill #0

## 2016-06-18 DIAGNOSIS — J069 Acute upper respiratory infection, unspecified: Secondary | ICD-10-CM | POA: Diagnosis not present

## 2016-06-18 MED FILL — HYDROCODONE-CHLORPHENIRAM S: 10-8 | 7 days supply | Qty: 70 | Fill #0

## 2016-06-30 MED FILL — POTASSIUM CL 10 MEQ TAB SA: 10 | 90 days supply | Qty: 93 | Fill #0

## 2016-06-30 MED FILL — AMLODIPINE-VALSARTAN 10-320: 10-320 | 30 days supply | Qty: 30 | Fill #1

## 2016-07-06 MED FILL — VALACYCLOVIR HCL 500 MG TAB: 500 | 30 days supply | Qty: 30 | Fill #1

## 2016-07-24 MED FILL — AMLODIPINE-VALSARTAN 10-320: 10-320 | 30 days supply | Qty: 30 | Fill #2

## 2016-07-28 DIAGNOSIS — H5203 Hypermetropia, bilateral: Secondary | ICD-10-CM | POA: Diagnosis not present

## 2016-08-14 MED FILL — CHLORTHALIDONE 25 MG TABLET: 25 | 90 days supply | Qty: 90 | Fill #0

## 2016-08-14 MED FILL — VALACYCLOVIR HCL 500 MG TAB: 500 | 90 days supply | Qty: 90 | Fill #1

## 2016-08-19 MED FILL — AMOXICILLIN 500 MG CAPSULE: 500 | 9 days supply | Qty: 28 | Fill #0

## 2016-09-10 DIAGNOSIS — Z01419 Encounter for gynecological examination (general) (routine) without abnormal findings: Secondary | ICD-10-CM | POA: Diagnosis not present

## 2016-09-29 DIAGNOSIS — Z6838 Body mass index (BMI) 38.0-38.9, adult: Secondary | ICD-10-CM | POA: Diagnosis not present

## 2016-09-29 DIAGNOSIS — E669 Obesity, unspecified: Secondary | ICD-10-CM | POA: Diagnosis not present

## 2016-09-29 DIAGNOSIS — I10 Essential (primary) hypertension: Secondary | ICD-10-CM | POA: Diagnosis not present

## 2016-09-30 MED FILL — AMLODIPINE-VALSARTAN 10-320: 10-320 | 30 days supply | Qty: 30 | Fill #3

## 2016-10-24 MED FILL — POTASSIUM CL 10 MEQ TAB SA: 10 | 90 days supply | Qty: 93 | Fill #1

## 2016-11-16 MED FILL — VALACYCLOVIR HCL 500 MG TAB: 500 | 90 days supply | Qty: 90 | Fill #2

## 2016-11-16 MED FILL — AMLODIPINE-VALSARTAN 10-320: 10-320 | 30 days supply | Qty: 30 | Fill #4

## 2016-11-16 MED FILL — CHLORTHALIDONE 25 MG TABLET: 25 | 90 days supply | Qty: 90 | Fill #1

## 2016-12-01 DIAGNOSIS — R21 Rash and other nonspecific skin eruption: Secondary | ICD-10-CM | POA: Diagnosis not present

## 2016-12-16 MED FILL — AMLODIPINE-VALSARTAN 10-320: 10-320 | 30 days supply | Qty: 30 | Fill #5

## 2017-01-27 MED FILL — AMLODIPINE-VALSARTAN 10-320: 10-320 | 30 days supply | Qty: 30 | Fill #6

## 2017-02-16 ENCOUNTER — Other Ambulatory Visit: Payer: Self-pay | Admitting: Internal Medicine

## 2017-02-16 DIAGNOSIS — Z1231 Encounter for screening mammogram for malignant neoplasm of breast: Secondary | ICD-10-CM

## 2017-02-22 MED FILL — AMLODIPINE-VALSARTAN 10-320: 10-320 | 30 days supply | Qty: 30 | Fill #7

## 2017-02-22 MED FILL — POTASSIUM CL ER 10 MEQ TABL: 10 | 90 days supply | Qty: 93 | Fill #2

## 2017-03-03 ENCOUNTER — Encounter: Payer: Self-pay | Admitting: Podiatry

## 2017-03-03 ENCOUNTER — Ambulatory Visit: Payer: 59 | Admitting: Podiatry

## 2017-03-03 DIAGNOSIS — S90422A Blister (nonthermal), left great toe, initial encounter: Secondary | ICD-10-CM

## 2017-03-07 NOTE — Progress Notes (Signed)
  Subjective:  Patient ID: Christine Eaton, female    DOB: [birth year],  MRN: [id number]  Chief Complaint  Patient presents with  . Nail Problem    i had a blister on the left big toe and i busted it and clear white fluid came out    56 y.o. female returns for the above complaint.  Patient states that she has a blister on her left great toe the blister opened and drained a clear white fluid.  Denies redness or other purulent drainage has been cleaning it with peroxide and alcohol  Objective:  There were no vitals filed for this visit. General AA&O x3. Normal mood and affect.  Vascular Pedal pulses palpable.  Neurologic Epicritic sensation grossly intact.  Dermatologic  left hallux evidence of blister with minimal redundant skin  Orthopedic: No pain to palpation either foot.   Assessment & Plan:  Patient was evaluated and treated and all questions answered.  Blister L Great Toe -Betadine applied. -Educated on self-care  Return in about 2 weeks (around 03/17/2017) for blister f/u.

## 2017-03-17 MED FILL — VALACYCLOVIR HCL 500 MG TAB: 500 | 90 days supply | Qty: 90 | Fill #0

## 2017-03-18 ENCOUNTER — Ambulatory Visit: Payer: 59 | Admitting: Podiatry

## 2017-03-18 DIAGNOSIS — E669 Obesity, unspecified: Secondary | ICD-10-CM | POA: Diagnosis not present

## 2017-03-18 DIAGNOSIS — Z1159 Encounter for screening for other viral diseases: Secondary | ICD-10-CM | POA: Diagnosis not present

## 2017-03-18 DIAGNOSIS — Z6836 Body mass index (BMI) 36.0-36.9, adult: Secondary | ICD-10-CM | POA: Diagnosis not present

## 2017-03-18 DIAGNOSIS — I10 Essential (primary) hypertension: Secondary | ICD-10-CM | POA: Diagnosis not present

## 2017-03-18 DIAGNOSIS — R739 Hyperglycemia, unspecified: Secondary | ICD-10-CM | POA: Diagnosis not present

## 2017-03-18 MED FILL — CHLORTHALIDONE 25 MG TAB: 25 | 90 days supply | Qty: 90 | Fill #2

## 2017-03-31 ENCOUNTER — Ambulatory Visit
Admission: RE | Admit: 2017-03-31 | Discharge: 2017-03-31 | Disposition: A | Discharge status: Home / Self Care | Payer: 59 | Source: Ambulatory Visit | Attending: Internal Medicine | Admitting: Internal Medicine

## 2017-03-31 DIAGNOSIS — Z1231 Encounter for screening mammogram for malignant neoplasm of breast: Secondary | ICD-10-CM

## 2017-04-01 ENCOUNTER — Ambulatory Visit: Payer: Self-pay

## 2017-04-05 MED FILL — AMLODIPINE-VALSARTAN 10-320: 10-320 | 30 days supply | Qty: 30 | Fill #8

## 2017-04-14 DIAGNOSIS — B009 Herpesviral infection, unspecified: Secondary | ICD-10-CM | POA: Diagnosis not present

## 2017-04-14 DIAGNOSIS — Z01419 Encounter for gynecological examination (general) (routine) without abnormal findings: Secondary | ICD-10-CM | POA: Diagnosis not present

## 2017-04-14 DIAGNOSIS — Z6837 Body mass index (BMI) 37.0-37.9, adult: Secondary | ICD-10-CM | POA: Diagnosis not present

## 2017-04-14 DIAGNOSIS — Z124 Encounter for screening for malignant neoplasm of cervix: Secondary | ICD-10-CM | POA: Diagnosis not present

## 2017-04-21 ENCOUNTER — Encounter: Payer: Self-pay | Admitting: Podiatry

## 2017-04-21 ENCOUNTER — Ambulatory Visit: Payer: 59 | Admitting: Podiatry

## 2017-04-21 DIAGNOSIS — L601 Onycholysis: Secondary | ICD-10-CM | POA: Diagnosis not present

## 2017-04-21 DIAGNOSIS — B351 Tinea unguium: Secondary | ICD-10-CM

## 2017-04-21 NOTE — Progress Notes (Signed)
  Subjective:  Patient ID: Christine Eaton, female    DOB: [birth year],  MRN: [id number]  Chief Complaint  Patient presents with  . Nail Problem    i am doing better on the left big toe    57 y.o. female returns for the above complaint.  That the blister on her left great toe is now healed.  States that she missed her last appointment due to the snow.  Also complains of discoloration to both great toenails.  Objective:  There were no vitals filed for this visit. General AA&O x3. Normal mood and affect.  Vascular Pedal pulses palpable.  Neurologic Epicritic sensation grossly intact.  Dermatologic No open lesions. Skin normal texture and turgor. Hallux nails with white discoloration crumbly texture thickening  Orthopedic: No pain to palpation either foot.   Assessment & Plan:  Patient was evaluated and treated and all questions answered.  Blister left great toe -Healed today.  Onychomycosis -Nail sample taken for histology and microbiology.  Will follow culture results in 5 weeks  Return in about 5 weeks (around 05/26/2017) for Nail Fungus.

## 2017-04-22 NOTE — Addendum Note (Signed)
Addended by: Cranford Mon R on: 04/22/2017 07:54 AM   Modules accepted: Orders

## 2017-05-12 MED FILL — AMLODIPINE-VALSARTAN 10-320: 10-320 | 30 days supply | Qty: 30 | Fill #9

## 2017-05-27 ENCOUNTER — Encounter: Payer: Self-pay | Admitting: Podiatry

## 2017-05-27 ENCOUNTER — Ambulatory Visit: Payer: 59 | Admitting: Podiatry

## 2017-05-27 DIAGNOSIS — B351 Tinea unguium: Secondary | ICD-10-CM

## 2017-05-27 MED ORDER — TERBINAFINE HCL 250 MG PO TABS: 250.0000 mg | ORAL_TABLET | Freq: Every day | ORAL | 0 refills | Status: DC

## 2017-05-27 MED FILL — TERBINAFINE HCL 250 MG TAB: 250 | 90 days supply | Qty: 90 | Fill #0

## 2017-05-28 ENCOUNTER — Other Ambulatory Visit: Payer: Self-pay | Admitting: Podiatry

## 2017-05-28 DIAGNOSIS — B351 Tinea unguium: Secondary | ICD-10-CM | POA: Diagnosis not present

## 2017-05-31 LAB — HEPATIC FUNCTION PANEL
ALT: 17 IU/L (ref 0–32)
AST: 21 IU/L (ref 0–40)
Albumin: 4.4 g/dL (ref 3.5–5.5)
Alkaline Phosphatase: 86 IU/L (ref 39–117)
Bilirubin Total: 0.4 mg/dL (ref 0.0–1.2)
Bilirubin, Direct: 0.12 mg/dL (ref 0.00–0.40)
Total Protein: 7.4 g/dL (ref 6.0–8.5)

## 2017-05-31 NOTE — Progress Notes (Signed)
  Subjective:  Patient ID: Christine Eaton, female    DOB: 1960-08-25,  MRN: [id number]  Chief Complaint  Patient presents with  . Blister    Left Hallux - medial side  . Nail Problem    Review [place] results    57 y.o. female returns for the above complaint. Here to discuss culture results her last visit.  Objective:  There were no vitals filed for this visit. General AA&O x3. Normal mood and affect.  Vascular Pedal pulses palpable.  Neurologic Epicritic sensation grossly intact.  Dermatologic  hallux nails with white discoloration, thickening  Orthopedic: No pain to palpation either foot.   Assessment & Plan:  Patient was evaluated and treated and all questions answered.  Onychomycosis -Educated on etiology of nail fungus. -Nail culture reviewed. -Baseline liver function studies ordered. Will d/c terbinafine if elevated during therapy. -eRx for oral terbinafine #30. Educated on risks and benefits of the medication.   Return in about 5 weeks (around 07/01/2017) for Nail Fungus.

## 2017-06-09 MED FILL — POTASSIUM CL ER 10 MEQ TABL: 10 | 90 days supply | Qty: 90 | Fill #3

## 2017-06-09 MED FILL — CHLORTHALIDONE 25 MG TAB: 25 | 90 days supply | Qty: 90 | Fill #3

## 2017-06-18 DIAGNOSIS — H524 Presbyopia: Secondary | ICD-10-CM | POA: Diagnosis not present

## 2017-07-01 ENCOUNTER — Ambulatory Visit: Payer: 59 | Admitting: Podiatry

## 2017-07-01 ENCOUNTER — Other Ambulatory Visit: Payer: Self-pay

## 2017-07-01 DIAGNOSIS — B351 Tinea unguium: Secondary | ICD-10-CM

## 2017-07-01 MED ORDER — TERBINAFINE HCL 250 MG PO TABS: 250.0000 mg | ORAL_TABLET | Freq: Every day | ORAL | 0 refills | Status: DC

## 2017-07-01 NOTE — Progress Notes (Signed)
  Subjective:  Patient ID: Christine Eaton, female    DOB: 13-Jan-1961,  MRN: [id number]   57 y.o. female returns for discoloration to her toenails..  States that she has noticed improvement of the nails however she is still not satisfied with their appearance.  Objective:  There were no vitals filed for this visit. General AA&O x3. Normal mood and affect.  Vascular Pedal pulses palpable.  Neurologic Epicritic sensation grossly intact.  Dermatologic Hallux nails with white discoloration, thickening; some evidence of proximal clearing.  Orthopedic: No pain to palpation either foot.   Assessment & Plan:  Patient was evaluated and treated and all questions answered.  Onychomycosis -Refill Lamisil, new LFTs ordered.  Discussed possible laser therapy.  Patient wishes to hold at this time.  Return in about 6 weeks (around 08/12/2017).

## 2017-07-05 MED FILL — VALACYCLOVIR HCL 500 MG TAB: 500 | 90 days supply | Qty: 90 | Fill #0

## 2017-07-07 MED FILL — AMLODIPINE-VALSARTAN 10-320: 10-320 | 30 days supply | Qty: 30 | Fill #0

## 2017-08-06 MED FILL — AMLODIPINE-VALSARTAN 10-320: 10-320 | 30 days supply | Qty: 30 | Fill #1

## 2017-08-12 ENCOUNTER — Ambulatory Visit (INDEPENDENT_AMBULATORY_CARE_PROVIDER_SITE_OTHER): Payer: 59 | Admitting: Podiatry

## 2017-08-12 ENCOUNTER — Other Ambulatory Visit: Payer: Self-pay

## 2017-08-12 DIAGNOSIS — E669 Obesity, unspecified: Secondary | ICD-10-CM | POA: Insufficient documentation

## 2017-08-12 DIAGNOSIS — B351 Tinea unguium: Secondary | ICD-10-CM | POA: Diagnosis not present

## 2017-08-12 DIAGNOSIS — B3731 Acute candidiasis of vulva and vagina: Secondary | ICD-10-CM | POA: Insufficient documentation

## 2017-08-12 DIAGNOSIS — B009 Herpesviral infection, unspecified: Secondary | ICD-10-CM | POA: Insufficient documentation

## 2017-08-12 DIAGNOSIS — M549 Dorsalgia, unspecified: Secondary | ICD-10-CM

## 2017-08-12 DIAGNOSIS — B373 Candidiasis of vulva and vagina: Secondary | ICD-10-CM | POA: Insufficient documentation

## 2017-08-12 DIAGNOSIS — G8929 Other chronic pain: Secondary | ICD-10-CM | POA: Insufficient documentation

## 2017-08-12 HISTORY — DX: Acute candidiasis of vulva and vagina: B37.31

## 2017-09-03 MED FILL — AMLODIPINE-VALSARTAN 10-320: 10-320 | 30 days supply | Qty: 30 | Fill #2

## 2017-09-03 NOTE — Progress Notes (Signed)
  Subjective:  Patient ID: Christine Eaton, female    DOB: 1961/01/30,  MRN: [id number]  No chief complaint on file.  57 y.o. female returns for the above complaint.  Has noted some progress but not pleased with the results of her nail Objective:  There were no vitals filed for this visit. General AA&O x3. Normal mood and affect.  Vascular Pedal pulses palpable.  Neurologic Epicritic sensation grossly intact.  Dermatologic No open lesions. Skin normal texture and turgor.  Possible click noted  Orthopedic: No pain to palpation either foot.   Assessment & Plan:  Patient was evaluated and treated and all questions answered.  Onychomycosis  -Advised that she needs to wait further for the nails to get the full results -Continue Lamisil to completion  Return in about 5 weeks (around 09/16/2017) for Nail Fungus.

## 2017-09-15 DIAGNOSIS — B351 Tinea unguium: Secondary | ICD-10-CM | POA: Diagnosis not present

## 2017-09-16 ENCOUNTER — Other Ambulatory Visit: Payer: Self-pay | Admitting: Podiatry

## 2017-09-16 ENCOUNTER — Ambulatory Visit: Payer: 59 | Admitting: Podiatry

## 2017-09-16 DIAGNOSIS — L603 Nail dystrophy: Secondary | ICD-10-CM | POA: Diagnosis not present

## 2017-09-16 DIAGNOSIS — L600 Ingrowing nail: Secondary | ICD-10-CM

## 2017-09-16 DIAGNOSIS — B351 Tinea unguium: Secondary | ICD-10-CM

## 2017-09-16 LAB — HEPATIC FUNCTION PANEL
ALT: 19 IU/L (ref 0–32)
AST: 20 IU/L (ref 0–40)
Albumin: 4.6 g/dL (ref 3.5–5.5)
Alkaline Phosphatase: 93 IU/L (ref 39–117)
Bilirubin Total: 0.2 mg/dL (ref 0.0–1.2)
Bilirubin, Direct: 0.08 mg/dL (ref 0.00–0.40)
Total Protein: 7.5 g/dL (ref 6.0–8.5)

## 2017-09-16 NOTE — Patient Instructions (Signed)

## 2017-09-17 ENCOUNTER — Telehealth: Payer: Self-pay

## 2017-09-17 ENCOUNTER — Telehealth: Payer: Self-pay | Admitting: Podiatry

## 2017-09-17 NOTE — Telephone Encounter (Signed)
This is Ivin Booty calling from [facility]. We received a specimen on this pt which was a toenail. It was submitted fresh in a sterile container it looks like. I just want to confirm what kind of testing that you need on that. It came to the department where they handle the biopsies. So I don't know if you just want a nail biopsy on it or if it needed to go maybe to microbiology for like a testing/culture. If you will just give me a call back at [phone] extension [phone]. Thank you so much. Bye bye.

## 2017-09-17 NOTE — Telephone Encounter (Signed)
Returned [facility] call and discussed specimen at length. We concluded that it did not need to be in the pathology department. She will check with microbiology to see if it should be run as a cultutre and sensitivity or a body fluid cell count w/differential.   Christine Eaton will call back if she has more questions and ask for me specifically.

## 2017-09-21 LAB — PATHOLOGY

## 2017-09-21 LAB — TISSUE SPECIMEN

## 2017-09-28 MED FILL — TERBINAFINE HCL 250 MG TAB: 250 | 90 days supply | Qty: 90 | Fill #0

## 2017-09-28 MED FILL — AMLODIPINE-VALSARTAN 10-320: 10-320 | 30 days supply | Qty: 30 | Fill #3

## 2017-09-28 MED FILL — VALACYCLOVIR HCL 500 MG TAB: 500 | 90 days supply | Qty: 90 | Fill #1

## 2017-09-30 ENCOUNTER — Ambulatory Visit: Payer: 59 | Admitting: Podiatry

## 2017-10-03 NOTE — Progress Notes (Signed)
  Subjective:  Patient ID: Christine Eaton, female    DOB: 1960/08/03,  MRN: [id number]  Chief Complaint  Patient presents with  . Nail Problem    left great toenail; doesn't seem to be getting better   57 y.o. female returns for the above complaint. States the left great toenail has not gotten better. Objective:  There were no vitals filed for this visit. General AA&O x3. Normal mood and affect.  Vascular Pedal pulses palpable.  Neurologic Epicritic sensation grossly intact.  Dermatologic No open lesions. Skin normal texture and turgor. L great toenail caseous discharge noted.  Orthopedic: No pain to palpation either foot.   Assessment & Plan:  Patient was evaluated and treated and all questions answered.  L Great toenail ingrown nail -Caseous discharge expressed. Collected for path. -Nail anesthetized and matrixectomy performed with phenol.   Return in about 2 weeks (around 09/30/2017) for Nail Check.

## 2017-10-04 MED FILL — POTASSIUM CL ER 10 MEQ TABL: 10 | 90 days supply | Qty: 90 | Fill #0

## 2017-10-14 ENCOUNTER — Ambulatory Visit: Payer: 59 | Admitting: Podiatry

## 2017-10-14 DIAGNOSIS — L600 Ingrowing nail: Secondary | ICD-10-CM

## 2017-10-14 DIAGNOSIS — B351 Tinea unguium: Secondary | ICD-10-CM

## 2017-10-14 NOTE — Progress Notes (Signed)
  Subjective:  Patient ID: Christine Eaton, female    DOB: Apr 20, 1960,  MRN: [id number]  Chief Complaint  Patient presents with  . Ingrown Toenail    2 week nail chaeck   57 y.o. female returns for the above complaint.  States the left great toenail is feeling better.  Also states that the right great toe where she is been using treatment for the fungus is doing better as well. Objective:  There were no vitals filed for this visit. General AA&O x3. Normal mood and affect.  Vascular Pedal pulses palpable.  Neurologic Epicritic sensation grossly intact.  Dermatologic No open lesions. Skin normal texture and turgor. L great toenail healing well. R great toenail with proximal clearing.  Orthopedic: No pain to palpation either foot.   Assessment & Plan:  Patient was evaluated and treated and all questions answered.  L Great toenail ingrown nail -Improving. No pain.  Onychomycosis -Improved. Continue antifungal regimen to completion. \ Return if symptoms worsen or fail to improve.

## 2017-10-28 DIAGNOSIS — I10 Essential (primary) hypertension: Secondary | ICD-10-CM | POA: Diagnosis not present

## 2017-10-28 DIAGNOSIS — R7301 Impaired fasting glucose: Secondary | ICD-10-CM | POA: Diagnosis not present

## 2017-11-01 ENCOUNTER — Encounter: Payer: Self-pay | Admitting: Podiatry

## 2017-11-01 ENCOUNTER — Ambulatory Visit: Payer: 59 | Admitting: Podiatry

## 2017-11-01 ENCOUNTER — Ambulatory Visit: Payer: 59

## 2017-11-01 DIAGNOSIS — L03032 Cellulitis of left toe: Secondary | ICD-10-CM

## 2017-11-01 DIAGNOSIS — M79675 Pain in left toe(s): Secondary | ICD-10-CM

## 2017-11-01 NOTE — Progress Notes (Signed)
dt ?

## 2017-11-01 NOTE — Patient Instructions (Signed)

## 2017-11-02 NOTE — Progress Notes (Signed)
Subjective:   Patient ID: Christine Eaton, female   DOB: 57 y.o.   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  Instructed on soaks and if any further redness were to occur we will start her on an antibiotic but should not be necessary and gave her instructions to call or contact us if any issues should occur

## 2017-11-05 MED FILL — CHLORTHALIDONE 25 MG TABS: 25 | 90 days supply | Qty: 90 | Fill #0

## 2017-11-19 MED FILL — AMLODIPINE-VALSARTAN 10-320: 10-320 | 30 days supply | Qty: 30 | Fill #4

## 2017-12-19 MED FILL — AMLODIPINE-VALSARTAN 10-320: 10-320 | 30 days supply | Qty: 30 | Fill #5

## 2017-12-29 MED FILL — POTASSIUM CHL ER M10 TABLET: 10 | 90 days supply | Qty: 90 | Fill #1

## 2018-01-17 MED FILL — AMLODIPINE-VALSARTAN 10-320: 10-320 | 30 days supply | Qty: 30 | Fill #6

## 2018-01-17 MED FILL — VALACYCLOVIR HCL 500 MG TAB: 500 | 90 days supply | Qty: 90 | Fill #2

## 2018-01-20 ENCOUNTER — Encounter (INDEPENDENT_AMBULATORY_CARE_PROVIDER_SITE_OTHER): Payer: 59

## 2018-02-14 MED FILL — CHLORTHALIDONE 25 MG TABS: 25 | 90 days supply | Qty: 90 | Fill #1

## 2018-02-15 MED FILL — AMLODIPINE-VALSARTAN 10-320: 10-320 | 30 days supply | Qty: 30 | Fill #7

## 2018-02-17 ENCOUNTER — Other Ambulatory Visit: Payer: Self-pay | Admitting: Internal Medicine

## 2018-02-17 DIAGNOSIS — Z1231 Encounter for screening mammogram for malignant neoplasm of breast: Secondary | ICD-10-CM

## 2018-03-31 MED FILL — AMLODIPINE-VALSARTAN 10-320: 10-320 | 30 days supply | Qty: 30 | Fill #8

## 2018-04-01 ENCOUNTER — Ambulatory Visit
Admission: RE | Admit: 2018-04-01 | Discharge: 2018-04-01 | Disposition: A | Discharge status: Home / Self Care | Payer: 59 | Source: Ambulatory Visit | Attending: Internal Medicine | Admitting: Internal Medicine

## 2018-04-01 DIAGNOSIS — Z1231 Encounter for screening mammogram for malignant neoplasm of breast: Secondary | ICD-10-CM

## 2018-04-07 ENCOUNTER — Ambulatory Visit: Payer: 59 | Admitting: Podiatry

## 2018-04-28 MED FILL — POTASSIUM CHL ER M10 TABLET: 10 | 90 days supply | Qty: 90 | Fill #2

## 2018-04-28 MED FILL — AMLODIPINE-VALSARTAN 10-320: 10-320 | 30 days supply | Qty: 30 | Fill #9

## 2018-05-02 MED FILL — VALACYCLOVIR HCL 500 MG TAB: 500 | 90 days supply | Qty: 90 | Fill #0

## 2018-05-04 MED FILL — TRIAMCINOLONE 0.1% OINTMENT: 0.1 | 14 days supply | Qty: 15 | Fill #0

## 2018-05-05 ENCOUNTER — Ambulatory Visit (INDEPENDENT_AMBULATORY_CARE_PROVIDER_SITE_OTHER): Payer: No Typology Code available for payment source | Admitting: Podiatry

## 2018-05-05 DIAGNOSIS — L600 Ingrowing nail: Secondary | ICD-10-CM

## 2018-05-05 DIAGNOSIS — M79676 Pain in unspecified toe(s): Secondary | ICD-10-CM | POA: Diagnosis not present

## 2018-05-05 NOTE — Progress Notes (Signed)
  Subjective:  Patient ID: Christine Eaton, female    DOB: 10/13/1960,  MRN: [id number]  Chief Complaint  Patient presents with  . Foot Problem    left great toenail pain f/u. Pain is present on the tip of the right toenail medial    58 y.o. female presents with the above complaint.  Presents toenail issues to the left great toe states that she has had issues with this toenail multiple times.  States that the tip of the nail is hurting her   Review of Systems: Negative except as noted in the HPI. Denies N/V/F/Ch.  Past Medical History:  Diagnosis Date  . Hypertension    states under control with meds., has been on med. > 15 yr.  . Mass of right hand 09/2015    Current Outpatient Medications:  .  ALPRAZolam (XANAX) 0.25 MG tablet, alprazolam 0.25 mg tablet, Disp: , Rfl:  .  amLODipine-valsartan (EXFORGE) 10-320 MG per tablet, Take 1 tablet by mouth daily., Disp: , Rfl:  .  chlorthalidone (HYGROTON) 25 MG tablet, Take 25 mg by mouth daily., Disp: , Rfl:  .  cholecalciferol (VITAMIN D) 1000 units tablet, Take 1,000 Units by mouth daily., Disp: , Rfl:  .  citalopram (CELEXA) 40 MG tablet, citalopram 40 mg tablet, Disp: , Rfl:  .  cyclobenzaprine (FLEXERIL) 10 MG tablet, , Disp: , Rfl:  .  meloxicam (MOBIC) 15 MG tablet, meloxicam 15 mg tablet, Disp: , Rfl:  .  Multiple Vitamin (MULTIVITAMIN) tablet, Take 1 tablet by mouth daily., Disp: , Rfl:  .  naproxen sodium (ANAPROX) 550 MG tablet, naproxen sodium 550 mg tablet, Disp: , Rfl:  .  potassium chloride (K-DUR,KLOR-CON) 10 MEQ tablet, , Disp: , Rfl: 3 .  terbinafine (LAMISIL) 250 MG tablet, Take 1 tablet (250 mg total) by mouth daily., Disp: 90 tablet, Rfl: 0 .  triamcinolone ointment (KENALOG) 0.1 %, , Disp: , Rfl:  .  triazolam (HALCION) 0.25 MG tablet, triazolam 0.25 mg tablet, Disp: , Rfl:  .  Urea & Emollient (DERMASORB XM) 39 % KIT, Dermasorb XM Complete Kit 39 % topical cream, Disp: , Rfl:  .  valACYclovir (VALTREX) 500 MG  tablet, valacyclovir 500 mg tablet  TAKE 1 TABLET BY MOUTH ONCE DAILY, Disp: , Rfl:   Social History   Tobacco Use  Smoking Status Never Smoker  Smokeless Tobacco Never Used    Allergies  Allergen Reactions  . Lavender Oil Hives  . Citalopram Hydrobromide Other (See Comments)    Pt stated, "weight gain"  . Lisinopril Cough   Objective:  There were no vitals filed for this visit. There is no height or weight on file to calculate BMI. Constitutional Well developed. Well nourished.  Vascular Dorsalis pedis pulses palpable bilaterally. Posterior tibial pulses palpable bilaterally. Capillary refill normal to all digits.  No cyanosis or clubbing noted. Pedal hair growth normal.  Neurologic Normal speech. Oriented to person, place, and time. Epicritic sensation to light touch grossly present bilaterally.  Dermatologic Left hallux nail ingrowing at distal medial aspect No open wounds. No skin lesions.  Orthopedic: Normal joint ROM without pain or crepitus bilaterally. No visible deformities. No bony tenderness.   Radiographs: None Assessment:   1. Ingrown nail   2. Pain around toenail    Plan:  Patient was evaluated and treated and all questions answered.  Ingrown Nail -Educated on self-care -Left hallux nail debrided to remove ingrown aspect of nail  No follow-ups on file.

## 2018-05-10 MED FILL — CHLORTHALIDONE 25 MG TABS: 25 | 90 days supply | Qty: 90 | Fill #2

## 2018-06-20 MED FILL — AMLODIPINE-VALSARTAN 10-320: 10-320 | 30 days supply | Qty: 30 | Fill #10

## 2018-07-15 MED FILL — POTASSIUM CHL ER M10 TABLET: 10 | 90 days supply | Qty: 90 | Fill #3

## 2018-07-15 MED FILL — AMLODIPINE-VALSARTAN 10-320: 10-320 | 30 days supply | Qty: 30 | Fill #0

## 2018-07-15 MED FILL — VALACYCLOVIR HCL 500 MG TAB: 500 | 90 days supply | Qty: 90 | Fill #0

## 2018-07-15 MED FILL — CHLORTHALIDONE 25 MG TABS: 25 | 90 days supply | Qty: 90 | Fill #3

## 2018-08-26 MED FILL — AMLODIPINE-VALSARTAN 10-320: 10-320 | 30 days supply | Qty: 30 | Fill #0

## 2018-09-23 MED FILL — AMLODIPINE-VALSARTAN 10-320: 10-320 | 30 days supply | Qty: 30 | Fill #1

## 2018-10-07 MED FILL — VALACYCLOVIR HCL 500 MG TAB: 500 | 90 days supply | Qty: 90 | Fill #1

## 2018-10-17 MED FILL — AMLODIPINE-VALSARTAN 10-320: 10-320 | 90 days supply | Qty: 90 | Fill #0

## 2018-11-16 MED FILL — POTASSIUM CHL ER M10 TABLET: 10 | 90 days supply | Qty: 90 | Fill #0

## 2018-12-19 MED FILL — CHLORTHALIDONE 25 MG TABS: 25 | 90 days supply | Qty: 90 | Fill #0

## 2018-12-22 ENCOUNTER — Other Ambulatory Visit: Payer: Self-pay

## 2018-12-22 ENCOUNTER — Ambulatory Visit (INDEPENDENT_AMBULATORY_CARE_PROVIDER_SITE_OTHER): Payer: No Typology Code available for payment source | Admitting: Podiatry

## 2018-12-22 DIAGNOSIS — L720 Epidermal cyst: Secondary | ICD-10-CM

## 2018-12-22 DIAGNOSIS — L03032 Cellulitis of left toe: Secondary | ICD-10-CM

## 2018-12-27 NOTE — Progress Notes (Signed)
Subjective:  Patient ID: Christine Eaton, female    DOB: 1960/08/07,  MRN: [id number]  Chief Complaint  Patient presents with  . Foot Problem    Pt states left foot 1st digit medial border "bump" 2 month duration, no known injuries, no pain. Pt denies any drainage.    58 y.o. female presents with the above complaint.  History as above   Review of Systems: Negative except as noted in the HPI. Denies N/V/F/Ch.  Past Medical History:  Diagnosis Date  . Hypertension    states under control with meds., has been on med. > 15 yr.  . Mass of right hand 09/2015    Current Outpatient Medications:  .  ALPRAZolam (XANAX) 0.25 MG tablet, alprazolam 0.25 mg tablet, Disp: , Rfl:  .  amLODipine-valsartan (EXFORGE) 10-320 MG per tablet, Take 1 tablet by mouth daily., Disp: , Rfl:  .  chlorthalidone (HYGROTON) 25 MG tablet, Take 25 mg by mouth daily., Disp: , Rfl:  .  cholecalciferol (VITAMIN D) 1000 units tablet, Take 1,000 Units by mouth daily., Disp: , Rfl:  .  citalopram (CELEXA) 40 MG tablet, citalopram 40 mg tablet, Disp: , Rfl:  .  cyclobenzaprine (FLEXERIL) 10 MG tablet, , Disp: , Rfl:  .  meloxicam (MOBIC) 15 MG tablet, meloxicam 15 mg tablet, Disp: , Rfl:  .  Multiple Vitamin (MULTIVITAMIN) tablet, Take 1 tablet by mouth daily., Disp: , Rfl:  .  naproxen sodium (ANAPROX) 550 MG tablet, naproxen sodium 550 mg tablet, Disp: , Rfl:  .  ondansetron (ZOFRAN) 8 MG tablet, Take 8 mg by mouth 3 (three) times daily as needed., Disp: , Rfl:  .  potassium chloride (K-DUR,KLOR-CON) 10 MEQ tablet, , Disp: , Rfl: 3 .  terbinafine (LAMISIL) 250 MG tablet, Take 1 tablet (250 mg total) by mouth daily., Disp: 90 tablet, Rfl: 0 .  triamcinolone ointment (KENALOG) 0.1 %, , Disp: , Rfl:  .  triazolam (HALCION) 0.25 MG tablet, triazolam 0.25 mg tablet, Disp: , Rfl:  .  Urea & Emollient (DERMASORB XM) 39 % KIT, Dermasorb XM Complete Kit 39 % topical cream, Disp: , Rfl:  .  valACYclovir (VALTREX) 500 MG  tablet, valacyclovir 500 mg tablet  TAKE 1 TABLET BY MOUTH ONCE DAILY, Disp: , Rfl:   Social History   Tobacco Use  Smoking Status Never Smoker  Smokeless Tobacco Never Used    Allergies  Allergen Reactions  . Lavender Oil Hives  . Citalopram Hydrobromide Other (See Comments)    Pt stated, "weight gain"  . Lisinopril Cough   Objective:  There were no vitals filed for this visit. There is no height or weight on file to calculate BMI. Constitutional Well developed. Well nourished.  Vascular Dorsalis pedis pulses palpable bilaterally. Posterior tibial pulses palpable bilaterally. Capillary refill normal to all digits.  No cyanosis or clubbing noted. Pedal hair growth normal.  Neurologic Normal speech. Oriented to person, place, and time. Epicritic sensation to light touch grossly present bilaterally.  Dermatologic Medial cyst formation about the left first digit with pain to palpation  No open wounds. No skin lesions.  Orthopedic: Normal joint ROM without pain or crepitus bilaterally. No visible deformities. No bony tenderness.   Radiographs: None Assessment:   1. Epidermoid cyst    Plan:  Patient was evaluated and treated and all questions answered.  Epidermoid cyst left great toe -Nail anesthetized with 3 cc of 50-50 1% lidocaine plain half percent Marcaine plain. -Cyst area sharply incised return of macerated  keratin tissue.  Malodorous.  Maximal expressed -Discussed with patient that should this recur would consider excision of the cyst  Return in about 2 weeks (around 01/05/2019) for Epidermoid cyst f/u .

## 2019-01-05 ENCOUNTER — Other Ambulatory Visit: Payer: Self-pay

## 2019-01-05 ENCOUNTER — Ambulatory Visit: Payer: No Typology Code available for payment source | Admitting: Podiatry

## 2019-01-05 DIAGNOSIS — L720 Epidermal cyst: Secondary | ICD-10-CM | POA: Diagnosis not present

## 2019-01-05 NOTE — Progress Notes (Signed)
  Subjective:  Patient ID: Christine Eaton, female    DOB: [birth year],  MRN: [id number]  Chief Complaint  Patient presents with  . Cyst    Pt states healing, does not have any concerns, denies any drainage, denies pain.    58 y.o. female presents with the above complaint.  History as above   Review of Systems: Negative except as noted in the HPI. Denies N/V/F/Ch.  Past Medical History:  Diagnosis Date  . Hypertension    states under control with meds., has been on med. > 15 yr.  . Mass of right hand 09/2015    Current Outpatient Medications:  .  ALPRAZolam (XANAX) 0.25 MG tablet, alprazolam 0.25 mg tablet, Disp: , Rfl:  .  amLODipine-valsartan (EXFORGE) 10-320 MG per tablet, Take 1 tablet by mouth daily., Disp: , Rfl:  .  chlorthalidone (HYGROTON) 25 MG tablet, Take 25 mg by mouth daily., Disp: , Rfl:  .  Multiple Vitamin (MULTIVITAMIN) tablet, Take 1 tablet by mouth daily., Disp: , Rfl:  .  naproxen sodium (ANAPROX) 550 MG tablet, naproxen sodium 550 mg tablet, Disp: , Rfl:  .  ondansetron (ZOFRAN) 8 MG tablet, Take 8 mg by mouth 3 (three) times daily as needed., Disp: , Rfl:  .  potassium chloride (K-DUR,KLOR-CON) 10 MEQ tablet, , Disp: , Rfl: 3 .  terbinafine (LAMISIL) 250 MG tablet, Take 1 tablet (250 mg total) by mouth daily., Disp: 90 tablet, Rfl: 0 .  triamcinolone ointment (KENALOG) 0.1 %, , Disp: , Rfl:  .  Urea & Emollient (DERMASORB XM) 39 % KIT, Dermasorb XM Complete Kit 39 % topical cream, Disp: , Rfl:  .  valACYclovir (VALTREX) 500 MG tablet, valacyclovir 500 mg tablet  TAKE 1 TABLET BY MOUTH ONCE DAILY, Disp: , Rfl:  .  cholecalciferol (VITAMIN D) 1000 units tablet, Take 1,000 Units by mouth daily., Disp: , Rfl:  .  citalopram (CELEXA) 40 MG tablet, citalopram 40 mg tablet, Disp: , Rfl:  .  cyclobenzaprine (FLEXERIL) 10 MG tablet, , Disp: , Rfl:  .  meloxicam (MOBIC) 15 MG tablet, meloxicam 15 mg tablet, Disp: , Rfl:  .  triazolam (HALCION) 0.25 MG tablet, triazolam  0.25 mg tablet, Disp: , Rfl:   Social History   Tobacco Use  Smoking Status Never Smoker  Smokeless Tobacco Never Used    Allergies  Allergen Reactions  . Lavender Oil Hives  . Citalopram Hydrobromide Other (See Comments)    Pt stated, "weight gain"  . Lisinopril Cough   Objective:  There were no vitals filed for this visit. There is no height or weight on file to calculate BMI. Constitutional Well developed. Well nourished.  Vascular Dorsalis pedis pulses palpable bilaterally. Posterior tibial pulses palpable bilaterally. Capillary refill normal to all digits.  No cyanosis or clubbing noted. Pedal hair growth normal.  Neurologic Normal speech. Oriented to person, place, and time. Epicritic sensation to light touch grossly present bilaterally.  Dermatologic Hyperkeratosis without drainage r pain to palpation. No open wounds. No skin lesions.  Orthopedic: Normal joint ROM without pain or crepitus bilaterally. No visible deformities. No bony tenderness.   Radiographs: None Assessment:   1. Epidermoid cyst    Plan:  Patient was evaluated and treated and all questions answered.  Epidermoid cyst left great toe -Appears healed without recurrence. F/u as needed should it recur. At that time we would consider surgical excision.  No follow-ups on file.

## 2019-02-09 MED FILL — AMLODIPINE-VALSARTAN 10-320: 10-320 | 90 days supply | Qty: 90 | Fill #1

## 2019-03-07 MED FILL — POTASSIUM CHL ER M10 TABLET: 10 | 90 days supply | Qty: 90 | Fill #1

## 2019-03-07 MED FILL — CHLORTHALIDONE 25 MG TABS: 25 | 90 days supply | Qty: 90 | Fill #1

## 2019-03-07 MED FILL — VALACYCLOVIR HCL 500 MG TAB: 500 | 90 days supply | Qty: 90 | Fill #2

## 2019-03-09 ENCOUNTER — Ambulatory Visit (INDEPENDENT_AMBULATORY_CARE_PROVIDER_SITE_OTHER): Payer: No Typology Code available for payment source | Admitting: Podiatry

## 2019-03-09 ENCOUNTER — Other Ambulatory Visit: Payer: Self-pay

## 2019-03-09 DIAGNOSIS — M79676 Pain in unspecified toe(s): Secondary | ICD-10-CM | POA: Diagnosis not present

## 2019-03-09 DIAGNOSIS — L03032 Cellulitis of left toe: Secondary | ICD-10-CM | POA: Diagnosis not present

## 2019-03-09 DIAGNOSIS — L720 Epidermal cyst: Secondary | ICD-10-CM | POA: Diagnosis not present

## 2019-03-09 DIAGNOSIS — B351 Tinea unguium: Secondary | ICD-10-CM

## 2019-03-09 DIAGNOSIS — L600 Ingrowing nail: Secondary | ICD-10-CM

## 2019-03-09 NOTE — Patient Instructions (Signed)
Pre-Operative Instructions  Congratulations, you have decided to take an important step towards improving your quality of life.  You can be assured that the doctors and staff at [facility] will be with you every step of the way.  Here are some important things you should know:  1. Plan to be at the surgery center/hospital at least 1 (one) hour prior to your scheduled time, unless otherwise directed by the surgical center/hospital staff.  You must have a responsible adult accompany you, remain during the surgery and drive you home.  Make sure you have directions to the surgical center/hospital to ensure you arrive on time. 2. If you are having surgery at [facility] or [facility], you will need a copy of your medical history and physical form from your family physician within one month prior to the date of surgery. We will give you a form for your primary physician to complete.  3. We make every effort to accommodate the date you request for surgery.  However, there are times where surgery dates or times have to be moved.  We will contact you as soon as possible if a change in schedule is required.   4. No aspirin/ibuprofen for one week before surgery.  If you are on aspirin, any non-steroidal anti-inflammatory medications (Mobic, Aleve, Ibuprofen) should not be taken seven (7) days prior to your surgery.  You make take Tylenol for pain prior to surgery.  5. Medications - If you are taking daily heart and blood pressure medications, seizure, reflux, allergy, asthma, anxiety, pain or diabetes medications, make sure you notify the surgery center/hospital before the day of surgery so they can tell you which medications you should take or avoid the day of surgery. 6. No food or drink after midnight the night before surgery unless directed otherwise by surgical center/hospital staff. 7. No alcoholic beverages 24-hours prior to surgery.  No smoking 24-hours prior or 24-hours after  surgery. 8. Wear loose pants or shorts. They should be loose enough to fit over bandages, boots, and casts. 9. Don't wear slip-on shoes. Sneakers are preferred. 10. Bring your boot with you to the surgery center/hospital.  Also bring crutches or a walker if your physician has prescribed it for you.  If you do not have this equipment, it will be provided for you after surgery. 11. If you have not been contacted by the surgery center/hospital by the day before your surgery, call to confirm the date and time of your surgery. 12. Leave-time from work may vary depending on the type of surgery you have.  Appropriate arrangements should be made prior to surgery with your employer. 13. Prescriptions will be provided immediately following surgery by your doctor.  Fill these as soon as possible after surgery and take the medication as directed. Pain medications will not be refilled on weekends and must be approved by the doctor. 14. Remove nail polish on the operative foot and avoid getting pedicures prior to surgery. 15. Wash the night before surgery.  The night before surgery wash the foot and leg well with water and the antibacterial soap provided. Be sure to pay special attention to beneath the toenails and in between the toes.  Wash for at least three (3) minutes. Rinse thoroughly with water and dry well with a towel.  Perform this wash unless told not to do so by your physician.  Enclosed: 1 Ice pack (please put in freezer the night before surgery)   1 Hibiclens skin cleaner     Pre-op instructions  If you have any questions regarding the instructions, please do not hesitate to call our office.  [place]: [address], [place], [place] [address] -- [phone]  [place]: [address] [place], [place] [address] -- [phone]  [place]: [address]  [place], [place] [address] -- [phone]   Website: [email or web address] 

## 2019-03-10 ENCOUNTER — Telehealth: Payer: Self-pay | Admitting: *Deleted

## 2019-03-10 NOTE — Telephone Encounter (Signed)
"  I'm calling to schedule my surgery with Dr. March Rummage.  I saw him yesterday."  Did you sign your consent forms?  "I don't think I did."  Yes you did, I have them.  Dr. March Rummage does surgeries on Wednesdays.  Do you have a date that you like?  "Will he be able to do it this year?"  Yes, he can do it this year.  "That's great, what about December 30?"  Yes, he can do it on April 05, 2019.  "That's even better."  Someone from the surgical center will call you a day or two prior to your surgery date and will give you your arrival time.  You need to register via the surgical center's online portal.  The instructions on how to do that are in the brochure that we gave you.  "Thank you for your help."

## 2019-03-14 ENCOUNTER — Other Ambulatory Visit: Payer: Self-pay | Admitting: Internal Medicine

## 2019-03-14 DIAGNOSIS — Z1231 Encounter for screening mammogram for malignant neoplasm of breast: Secondary | ICD-10-CM

## 2019-03-23 ENCOUNTER — Telehealth: Payer: Self-pay | Admitting: *Deleted

## 2019-03-23 NOTE — Telephone Encounter (Signed)
DOS 04/05/2019 EXCISION BENIGN LESION 1.0 CM HALLUX LEFT FOOT - 11421  FOCUS: Eligibility Date - 04/06/2018  No Deductible  Co-pay - $500  Out of pocket - $2500 / met $967.28  Pre-certification is REQUIRED. Authorization # is [id number].  Valid from 04/05/2019 to 05/05/2019

## 2019-04-03 ENCOUNTER — Ambulatory Visit
Admission: RE | Admit: 2019-04-03 | Discharge: 2019-04-03 | Disposition: A | Discharge status: Home / Self Care | Payer: No Typology Code available for payment source | Source: Ambulatory Visit | Attending: Internal Medicine | Admitting: Internal Medicine

## 2019-04-03 ENCOUNTER — Telehealth: Payer: Self-pay | Admitting: *Deleted

## 2019-04-03 ENCOUNTER — Other Ambulatory Visit: Payer: Self-pay

## 2019-04-03 DIAGNOSIS — Z1231 Encounter for screening mammogram for malignant neoplasm of breast: Secondary | ICD-10-CM

## 2019-04-03 NOTE — Telephone Encounter (Signed)
"  I'm scheduled to have surgery on Wednesday.  I spoke to someone at the surgery center and told them I have not been feeling well this past week.  They told me to call you and cancel the surgery.  So, that is what I'm doing."  Would you like to reschedule it?  "No, I think I will hold off.  I need to check with my job and see when will be a good time.  Can I call you back?"  Sure, you can call me back, that will be fine.  I will cancel your surgery and I'll let Dr. March Rummage know.  I called the surgical center and left a message for Renee, I asked her to cancel Christine Eaton's surgery that's scheduled for 04/05/2019.

## 2019-04-05 ENCOUNTER — Ambulatory Visit: Payer: No Typology Code available for payment source | Attending: Internal Medicine

## 2019-04-05 DIAGNOSIS — Z20822 Contact with and (suspected) exposure to covid-19: Secondary | ICD-10-CM

## 2019-04-06 LAB — NOVEL CORONAVIRUS, NAA: SARS-CoV-2, NAA: NOT DETECTED

## 2019-04-07 HISTORY — PX: TOE SURGERY: SHX1073

## 2019-04-09 NOTE — Progress Notes (Signed)
Subjective:  Patient ID: Christine Eaton, female    DOB: [birth year],  MRN: [id number]  Chief Complaint  Patient presents with  . Cyst    Pt states cyst has returned and would like to discuss removing it permanently.    59 y.o. female presents with the above complaint.  History as above  Review of Systems: Negative except as noted in the HPI. Denies N/V/F/Ch.  Past Medical History:  Diagnosis Date  . Hypertension    states under control with meds., has been on med. > 15 yr.  . Mass of right hand 09/2015    Current Outpatient Medications:  .  ALPRAZolam (XANAX) 0.25 MG tablet, alprazolam 0.25 mg tablet, Disp: , Rfl:  .  amLODipine-valsartan (EXFORGE) 10-320 MG per tablet, Take 1 tablet by mouth daily., Disp: , Rfl:  .  chlorthalidone (HYGROTON) 25 MG tablet, Take 25 mg by mouth daily., Disp: , Rfl:  .  Multiple Vitamin (MULTIVITAMIN) tablet, Take 1 tablet by mouth daily., Disp: , Rfl:  .  naproxen sodium (ANAPROX) 550 MG tablet, naproxen sodium 550 mg tablet, Disp: , Rfl:  .  ondansetron (ZOFRAN) 8 MG tablet, Take 8 mg by mouth 3 (three) times daily as needed., Disp: , Rfl:  .  potassium chloride (K-DUR,KLOR-CON) 10 MEQ tablet, , Disp: , Rfl: 3 .  terbinafine (LAMISIL) 250 MG tablet, Take 1 tablet (250 mg total) by mouth daily., Disp: 90 tablet, Rfl: 0 .  triamcinolone ointment (KENALOG) 0.1 %, , Disp: , Rfl:  .  Urea & Emollient (DERMASORB XM) 39 % KIT, Dermasorb XM Complete Kit 39 % topical cream, Disp: , Rfl:  .  valACYclovir (VALTREX) 500 MG tablet, valacyclovir 500 mg tablet  TAKE 1 TABLET BY MOUTH ONCE DAILY, Disp: , Rfl:  .  cholecalciferol (VITAMIN D) 1000 units tablet, Take 1,000 Units by mouth daily., Disp: , Rfl:  .  citalopram (CELEXA) 40 MG tablet, citalopram 40 mg tablet, Disp: , Rfl:  .  cyclobenzaprine (FLEXERIL) 10 MG tablet, , Disp: , Rfl:  .  meloxicam (MOBIC) 15 MG tablet, meloxicam 15 mg tablet, Disp: , Rfl:  .  triazolam (HALCION) 0.25 MG tablet, triazolam  0.25 mg tablet, Disp: , Rfl:   Social History   Tobacco Use  Smoking Status Never Smoker  Smokeless Tobacco Never Used    Allergies  Allergen Reactions  . Lavender Oil Hives  . Citalopram Hydrobromide Other (See Comments)    Pt stated, "weight gain"  . Lisinopril Cough   Objective:  There were no vitals filed for this visit. There is no height or weight on file to calculate BMI. Constitutional Well developed. Well nourished.  Vascular Dorsalis pedis pulses palpable bilaterally. Posterior tibial pulses palpable bilaterally. Capillary refill normal to all digits.  No cyanosis or clubbing noted. Pedal hair growth normal.  Neurologic Normal speech. Oriented to person, place, and time. Epicritic sensation to light touch grossly present bilaterally.  Dermatologic Hyperkeratosis without drainage r pain to palpation. No open wounds. No skin lesions.  Orthopedic: Normal joint ROM without pain or crepitus bilaterally. No visible deformities. No bony tenderness.   Radiographs: None Assessment:   1. Epidermoid cyst   2. Paronychia of great toe, left   3. Ingrown nail   4. Pain around toenail   5. Dermatophytosis of nail    Plan:  Patient was evaluated and treated and all questions answered.  Epidermoid cyst left great toe -Given recurrence we did discuss with the patient she would benefit from  surgical excision of the epidermoid cyst.  All risk of the terms of surgery discussed with the patient.  Consent forms were drafted and reviewed -Patient has failed all conservative therapy and wishes to proceed with surgical intervention. All risks, benefits, and alternatives discussed with patient. No guarantees given. Consent reviewed and signed by patient. -Planned procedures: Excision of epidermoid cyst left great toe possible nail fold rearrangement  No follow-ups on file.

## 2019-04-14 ENCOUNTER — Encounter: Payer: No Typology Code available for payment source | Admitting: Podiatry

## 2019-04-20 ENCOUNTER — Encounter: Payer: No Typology Code available for payment source | Admitting: Podiatry

## 2019-05-03 ENCOUNTER — Ambulatory Visit: Payer: 59

## 2019-05-04 ENCOUNTER — Encounter: Payer: No Typology Code available for payment source | Admitting: Podiatry

## 2019-05-12 MED FILL — AMLODIPINE-VALSARTAN 10-320: 10-320 | 90 days supply | Qty: 90 | Fill #2

## 2019-06-19 MED FILL — VALACYCLOVIR HCL 500 MG TAB: 500 | 90 days supply | Qty: 90 | Fill #0

## 2019-06-26 MED FILL — POTASSIUM CHLORIDE CRYS ER: 10 | 90 days supply | Qty: 90 | Fill #2

## 2019-06-30 ENCOUNTER — Other Ambulatory Visit: Payer: Self-pay

## 2019-06-30 ENCOUNTER — Ambulatory Visit: Payer: No Typology Code available for payment source | Admitting: Podiatry

## 2019-06-30 DIAGNOSIS — L720 Epidermal cyst: Secondary | ICD-10-CM

## 2019-06-30 DIAGNOSIS — L600 Ingrowing nail: Secondary | ICD-10-CM | POA: Diagnosis not present

## 2019-06-30 DIAGNOSIS — L03032 Cellulitis of left toe: Secondary | ICD-10-CM

## 2019-06-30 NOTE — Patient Instructions (Signed)
Pre-Operative Instructions  Congratulations, you have decided to take an important step towards improving your quality of life.  You can be assured that the doctors and staff at [facility] will be with you every step of the way.  Here are some important things you should know:  1. Plan to be at the surgery center/hospital at least 1 (one) hour prior to your scheduled time, unless otherwise directed by the surgical center/hospital staff.  You must have a responsible adult accompany you, remain during the surgery and drive you home.  Make sure you have directions to the surgical center/hospital to ensure you arrive on time. 2. If you are having surgery at [facility] or [facility], you will need a copy of your medical history and physical form from your family physician within one month prior to the date of surgery. We will give you a form for your primary physician to complete.  3. We make every effort to accommodate the date you request for surgery.  However, there are times where surgery dates or times have to be moved.  We will contact you as soon as possible if a change in schedule is required.   4. No aspirin/ibuprofen for one week before surgery.  If you are on aspirin, any non-steroidal anti-inflammatory medications (Mobic, Aleve, Ibuprofen) should not be taken seven (7) days prior to your surgery.  You make take Tylenol for pain prior to surgery.  5. Medications - If you are taking daily heart and blood pressure medications, seizure, reflux, allergy, asthma, anxiety, pain or diabetes medications, make sure you notify the surgery center/hospital before the day of surgery so they can tell you which medications you should take or avoid the day of surgery. 6. No food or drink after midnight the night before surgery unless directed otherwise by surgical center/hospital staff. 7. No alcoholic beverages 24-hours prior to surgery.  No smoking 24-hours prior or 24-hours after  surgery. 8. Wear loose pants or shorts. They should be loose enough to fit over bandages, boots, and casts. 9. Don't wear slip-on shoes. Sneakers are preferred. 10. Bring your boot with you to the surgery center/hospital.  Also bring crutches or a walker if your physician has prescribed it for you.  If you do not have this equipment, it will be provided for you after surgery. 11. If you have not been contacted by the surgery center/hospital by the day before your surgery, call to confirm the date and time of your surgery. 12. Leave-time from work may vary depending on the type of surgery you have.  Appropriate arrangements should be made prior to surgery with your employer. 13. Prescriptions will be provided immediately following surgery by your doctor.  Fill these as soon as possible after surgery and take the medication as directed. Pain medications will not be refilled on weekends and must be approved by the doctor. 14. Remove nail polish on the operative foot and avoid getting pedicures prior to surgery. 15. Wash the night before surgery.  The night before surgery wash the foot and leg well with water and the antibacterial soap provided. Be sure to pay special attention to beneath the toenails and in between the toes.  Wash for at least three (3) minutes. Rinse thoroughly with water and dry well with a towel.  Perform this wash unless told not to do so by your physician.  Enclosed: 1 Ice pack (please put in freezer the night before surgery)   1 Hibiclens skin cleaner     Pre-op instructions  If you have any questions regarding the instructions, please do not hesitate to call our office.  [place]: [address], , [place] [address] -- [phone]  [place]: [address] , [place] [address] -- [phone]  [place]: [address], [place], [place] [address] -- [phone]   Website: [email or web address] 

## 2019-07-18 ENCOUNTER — Telehealth: Payer: Self-pay

## 2019-07-18 NOTE — Telephone Encounter (Signed)
DOS 08/09/2019  EXC BENIGN LESION LT - 11421 WADGE EXCISION OF SKIN LT - 11765  [facility] FOCUS PLAN EFFECTIVE DATE - 04/06/2018  PLAN DEDUCTIBLE - $0.00 OUT OF POCKET - $2500.00 W/ $75.00 MET COPAY $0.00 COINSURANCE - 100%  PER [place] AUTH IS REQUIRED FOR 08022 & 11765.   AUTH # [id number]  GOOD FROM 08/09/19 - 09/08/19  CALL REF # [id number]

## 2019-07-20 ENCOUNTER — Ambulatory Visit (INDEPENDENT_AMBULATORY_CARE_PROVIDER_SITE_OTHER): Payer: No Typology Code available for payment source | Admitting: Family Medicine

## 2019-07-20 ENCOUNTER — Encounter (INDEPENDENT_AMBULATORY_CARE_PROVIDER_SITE_OTHER): Payer: Self-pay | Admitting: Family Medicine

## 2019-07-20 ENCOUNTER — Other Ambulatory Visit: Payer: Self-pay

## 2019-07-20 VITALS — BP 112/74 | HR 62 | Temp 98.5°F | Ht 66.0 in | Wt 238.0 lb

## 2019-07-20 DIAGNOSIS — R7303 Prediabetes: Secondary | ICD-10-CM

## 2019-07-20 DIAGNOSIS — R0602 Shortness of breath: Secondary | ICD-10-CM | POA: Diagnosis not present

## 2019-07-20 DIAGNOSIS — R5383 Other fatigue: Secondary | ICD-10-CM | POA: Diagnosis not present

## 2019-07-20 DIAGNOSIS — Z9189 Other specified personal risk factors, not elsewhere classified: Secondary | ICD-10-CM

## 2019-07-20 DIAGNOSIS — Z6838 Body mass index (BMI) 38.0-38.9, adult: Secondary | ICD-10-CM

## 2019-07-20 DIAGNOSIS — M2550 Pain in unspecified joint: Secondary | ICD-10-CM

## 2019-07-20 DIAGNOSIS — E559 Vitamin D deficiency, unspecified: Secondary | ICD-10-CM

## 2019-07-20 DIAGNOSIS — F3289 Other specified depressive episodes: Secondary | ICD-10-CM | POA: Diagnosis not present

## 2019-07-20 DIAGNOSIS — Z0289 Encounter for other administrative examinations: Secondary | ICD-10-CM

## 2019-07-20 DIAGNOSIS — R0683 Snoring: Secondary | ICD-10-CM

## 2019-07-20 DIAGNOSIS — I10 Essential (primary) hypertension: Secondary | ICD-10-CM | POA: Diagnosis not present

## 2019-07-20 NOTE — Progress Notes (Signed)
Dear Dr. Laurann Montana,   Thank you for referring Christine Eaton to our clinic. The following note includes my evaluation and treatment recommendations.  Chief Complaint:   OBESITY Christine Eaton (MR# [id number]) is a 59 y.o. female who presents for evaluation and treatment of obesity and related comorbidities. Current BMI is Body mass index is 38.41 kg/m. Christine Eaton has been struggling with her weight for many years and has been unsuccessful in either losing weight, maintaining weight loss, or reaching her healthy weight goal.  Christine Eaton is currently in the action stage of change and ready to dedicate time achieving and maintaining a healthier weight. Christine Eaton is interested in becoming our patient and working on intensive lifestyle modifications including (but not limited to) diet and exercise for weight loss.  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  She says she craves fried food (worse in the evening).  No sugar cravings.  She drinks 64 ounces of water daily.  She has lowered her carb intake and has lost 6 pound since March on her own.  Christine Eaton's habits were reviewed today and are as follows: her desired weight loss is 69 pounds, she has been heavy most of her life, she started gaining weight after her mother's death, her heaviest weight ever was 248 pounds, she craves fried foods, she snacks frequently in the evenings, she is frequently drinking liquids with calories and she struggles with emotional eating.  Depression Screen Christine Eaton's Food and Mood (modified PHQ-9) score was 12.  Depression screen PHQ 2/9 07/20/2019  Decreased Interest 1  Down, Depressed, Hopeless 1  PHQ - 2 Score 2  Altered sleeping 2  Tired, decreased energy 2  Change in appetite 1  Feeling bad or failure about yourself  2  Trouble concentrating 2  Moving slowly or fidgety/restless 1  Suicidal thoughts 0  PHQ-9 Score 12  Difficult doing work/chores Somewhat difficult   Subjective:   1.  Other fatigue Christine Eaton admits to daytime somnolence and reports waking up still tired. Patent has a history of symptoms of daytime fatigue, morning fatigue and snoring. Christine Eaton generally gets 6 hours of sleep per night, and states that she has poor sleep quality. Snoring is present. Apneic episodes are present. Epworth Sleepiness Score is 6.  2. SOB (shortness of breath) on exertion Christine Eaton notes increasing shortness of breath with exercising and seems to be worsening over time with weight gain. She notes getting out of breath sooner with activity than she used to. This has gotten worse recently. Christine Eaton denies shortness of breath at rest or orthopnea.  3. Essential hypertension Review: taking medications as instructed, no medication side effects noted, no chest pain on exertion, no dyspnea on exertion, no swelling of ankles.  She is taking amlodipine-valsartan and chlorthalidone for blood pressure.   BP Readings from Last 3 Encounters:  07/20/19 112/74  01/23/16 107/73  10/02/15 121/62   Lab Results  Component Value Date   CREATININE 0.60 10/02/2015   4. Multiple joint pain Christine Eaton has neck, right knee, shoulder, and low back pain.  She would like to get a breast reduction.  She has history of meniscus repair with Dr. Percell Eaton.  5. Vitamin D deficiency She is currently taking OTC vitamin D 5000 IU each day. She denies nausea, vomiting or muscle weakness.  6. Snoring, with apneic episodes  Situation Chance of Dozing or Sleeping  Sitting and reading 1 = slight chance of dozing or sleeping  Watching television  1 = slight chance of dozing or sleeping  Sitting inactive in a public place (theater or meeting) 0 = would never doze or sleep  Sitting as a passenger in a car for an hour 1 = slight chance of dosing or sleeping  Lying down in the afternoon when circumstances permit 2 = moderate chance of dozing or sleeping  Sitting and talking to someone 0 = would never doze or sleep  Sitting  quietly after lunch without alcohol 1 = slight chance of dozing or sleeping  In a car, while stopped for a few minutes in traffic 0 = would never doze or sleep  TOTAL 6   7. Prediabetes Christine Eaton has a diagnosis of prediabetes based on her elevated HgA1c and was informed this puts her at greater risk of developing diabetes. She continues to work on diet and exercise to decrease her risk of diabetes. She denies nausea or hypoglycemia.  Her A1c was 6.4 last month with her PCP.  8. Other depression, with emotional eating Christine Eaton was screened for depression as part of her new patient workup.  9. At risk for heart disease Christine Eaton is at a higher than average risk for cardiovascular disease due to obesity.   Assessment/Plan:   1. Other fatigue Christine Eaton does feel that her weight is causing her energy to be lower than it should be. Fatigue may be related to obesity, depression or many other causes. Labs will be ordered, and in the meanwhile, Christine Eaton will focus on self care including making healthy food choices, increasing physical activity and focusing on stress reduction.  Orders - EKG 12-Lead - TSH - T4, free - T3 - Anemia panel  2. SOB (shortness of breath) on exertion Christine Eaton does feel that she gets out of breath more easily that she used to when she exercises. Christine Eaton's shortness of breath appears to be obesity related and exercise induced. She has agreed to work on weight loss and gradually increase exercise to treat her exercise induced shortness of breath. Will continue to monitor closely.  Orders - CBC with Differential/Platelet - Lipid panel  3. Essential hypertension Christine Eaton is working on healthy weight loss and exercise to improve blood pressure control. We will watch for signs of hypotension as she continues her lifestyle modifications.  4. Multiple joint pain Will follow because mobility and pain control are important for weight management.  5. Vitamin D deficiency Low  Vitamin D level contributes to fatigue and are associated with obesity, breast, and colon cancer.  Orders - VITAMIN D 25 Hydroxy (Vit-D Deficiency, Fractures)  6. Snoring, with apneic episodes Will refer patient to Sleep Medicine for evaluation.  Orders - Ambulatory referral to Neurology  7. Prediabetes Tanaisha will continue to work on weight loss, exercise, and decreasing simple carbohydrates to help decrease the risk of diabetes.   Orders - Comprehensive metabolic panel  8. Other depression, with emotional eating Hyun had a positive depression screening. Depression is commonly associated with obesity and often results in emotional eating behaviors. We will monitor this closely and work on CBT to help improve the non-hunger eating patterns. Referral to Psychology may be required if no improvement is seen as she continues in our clinic.  9. At risk for heart disease Roxette was given approximately 15 minutes of coronary artery disease prevention counseling today. She is 59 y.o. female and has risk factors for heart disease including obesity. We discussed intensive lifestyle modifications today with an emphasis on specific weight loss instructions and strategies.  10. Class 2 severe obesity with serious comorbidity and body mass index (BMI) of 38.0 to 38.9 in adult, unspecified obesity type (HCC) Shannikia is currently in the action stage of change and her goal is to continue with weight loss efforts. I recommend Shariyah begin the structured treatment plan as follows:  She has agreed to the Category 1 Plan.  Exercise goals: No exercise has been prescribed at this time.   Behavioral modification strategies: increasing lean protein intake, decreasing simple carbohydrates, increasing vegetables and increasing water intake.  She was informed of the importance of frequent follow-up visits to maximize her success with intensive lifestyle modifications for her multiple health conditions.  She was informed we would discuss her lab results at her next visit unless there is a critical issue that needs to be addressed sooner. Adaia agreed to keep her next visit at the agreed upon time to discuss these results.  Objective:   Blood pressure 112/74, pulse 62, temperature 98.5 F (36.9 C), temperature source Oral, height 5\' 6"  (1.676 m), weight 238 lb (108 kg), SpO2 96 %. Body mass index is 38.41 kg/m.  EKG: Normal sinus rhythm, rate 67 bpm.  Indirect Calorimeter completed today shows a VO2 of 199 and a REE of 1385.  Her calculated basal metabolic rate is 123456 thus her basal metabolic rate is worse than expected.  General: Cooperative, alert, well developed, in no acute distress. HEENT: Conjunctivae and lids unremarkable. Cardiovascular: Regular rhythm.  Lungs: Normal work of breathing. Neurologic: No focal deficits.   Lab Results  Component Value Date   CREATININE 0.60 10/02/2015   BUN 18 10/02/2015   NA 139 10/02/2015   K 2.9 (L) 10/02/2015   CL 100 (L) 10/02/2015   CO2 27 01/28/2012   Lab Results  Component Value Date   ALT 19 09/15/2017   AST 20 09/15/2017   ALKPHOS 93 09/15/2017   BILITOT 0.2 09/15/2017   Lab Results  Component Value Date   WBC 4.9 06/18/2007   HGB 13.6 10/02/2015   HCT 40.0 10/02/2015   MCV 79.4 06/18/2007   PLT 375 06/18/2007   Attestation Statements:   This is the patient's first visit at [facility]. The patient's NEW PATIENT PACKET was reviewed at length. Included in the packet: current and past health history, medications, allergies, ROS, gynecologic history (women only), surgical history, family history, social history, weight history, weight loss surgery history (for those that have had weight loss surgery), nutritional evaluation, mood and food questionnaire, PHQ9, Epworth questionnaire, sleep habits questionnaire, patient life and health improvement goals questionnaire. These will all be scanned into the patient's  chart under media.   During the visit, I independently reviewed the patient's EKG, bioimpedance scale results, and indirect calorimeter results. I used this information to tailor a meal plan for the patient that will help her to lose weight and will improve her obesity-related conditions going forward. I performed a medically necessary appropriate examination and/or evaluation. I discussed the assessment and treatment plan with the patient. The patient was provided an opportunity to ask questions and all were answered. The patient agreed with the plan and demonstrated an understanding of the instructions. Labs were ordered at this visit and will be reviewed at the next visit unless more critical results need to be addressed immediately. Clinical information was updated and documented in the EMR.   I, Water quality scientist, CMA, am acting as Location manager for [employer], DO.  I have reviewed the above documentation for accuracy and  completeness, and I agree with the above. Briscoe Deutscher, DO

## 2019-07-21 LAB — COMPREHENSIVE METABOLIC PANEL
ALT: 19 IU/L (ref 0–32)
AST: 21 IU/L (ref 0–40)
Albumin/Globulin Ratio: 1.6 (ref 1.2–2.2)
Albumin: 4.4 g/dL (ref 3.8–4.9)
Alkaline Phosphatase: 94 IU/L (ref 39–117)
BUN/Creatinine Ratio: 21 (ref 9–23)
BUN: 14 mg/dL (ref 6–24)
Bilirubin Total: 0.4 mg/dL (ref 0.0–1.2)
CO2: 23 mmol/L (ref 20–29)
Calcium: 9.8 mg/dL (ref 8.7–10.2)
Chloride: 100 mmol/L (ref 96–106)
Creatinine, Ser: 0.68 mg/dL (ref 0.57–1.00)
GFR calc Af Amer: 112 mL/min/{1.73_m2} (ref >59)
GFR calc non Af Amer: 97 mL/min/{1.73_m2} (ref >59)
Globulin, Total: 2.7 g/dL (ref 1.5–4.5)
Glucose: 106 mg/dL — ABNORMAL HIGH (ref 65–99)
Potassium: 3.6 mmol/L (ref 3.5–5.2)
Sodium: 141 mmol/L (ref 134–144)
Total Protein: 7.1 g/dL (ref 6.0–8.5)

## 2019-07-21 LAB — ANEMIA PANEL
Ferritin: 269 ng/mL — ABNORMAL HIGH (ref 15–150)
Folate, Hemolysate: 315 ng/mL
Folate, RBC: 765 ng/mL (ref >498)
Hematocrit: 41.2 % (ref 34.0–46.6)
Iron Saturation: 28 % (ref 15–55)
Iron: 91 ug/dL (ref 27–159)
Retic Ct Pct: 1.5 % (ref 0.6–2.6)
Total Iron Binding Capacity: 325 ug/dL (ref 250–450)
UIBC: 234 ug/dL (ref 131–425)
Vitamin B-12: 479 pg/mL (ref 232–1245)

## 2019-07-21 LAB — CBC WITH DIFFERENTIAL/PLATELET
Basophils Absolute: 0 10*3/uL (ref 0.0–0.2)
Basos: 1 %
EOS (ABSOLUTE): 0 10*3/uL (ref 0.0–0.4)
Eos: 1 %
Hemoglobin: 13.7 g/dL (ref 11.1–15.9)
Immature Grans (Abs): 0 10*3/uL (ref 0.0–0.1)
Immature Granulocytes: 0 %
Lymphocytes Absolute: 0.9 10*3/uL (ref 0.7–3.1)
Lymphs: 27 %
MCH: 26.4 pg — ABNORMAL LOW (ref 26.6–33.0)
MCHC: 33.3 g/dL (ref 31.5–35.7)
MCV: 80 fL (ref 79–97)
Monocytes Absolute: 0.4 10*3/uL (ref 0.1–0.9)
Monocytes: 11 %
Neutrophils Absolute: 2 10*3/uL (ref 1.4–7.0)
Neutrophils: 60 %
Platelets: 351 10*3/uL (ref 150–450)
RBC: 5.18 x10E6/uL (ref 3.77–5.28)
RDW: 13.5 % (ref 11.7–15.4)
WBC: 3.3 10*3/uL — ABNORMAL LOW (ref 3.4–10.8)

## 2019-07-21 LAB — MICROALBUMIN / CREATININE URINE RATIO
Creatinine, Urine: 109 mg/dL
Microalb/Creat Ratio: 3 mg/g creat (ref 0–29)
Microalbumin, Urine: 3.7 ug/mL

## 2019-07-21 LAB — LIPID PANEL
Chol/HDL Ratio: 2.9 ratio (ref 0.0–4.4)
Cholesterol, Total: 189 mg/dL (ref 100–199)
HDL: 65 mg/dL (ref >39)
LDL Chol Calc (NIH): 115 mg/dL — ABNORMAL HIGH (ref 0–99)
Triglycerides: 48 mg/dL (ref 0–149)
VLDL Cholesterol Cal: 9 mg/dL (ref 5–40)

## 2019-07-21 LAB — T4, FREE: Free T4: 1.34 ng/dL (ref 0.82–1.77)

## 2019-07-21 LAB — VITAMIN D 25 HYDROXY (VIT D DEFICIENCY, FRACTURES): Vit D, 25-Hydroxy: 68.5 ng/mL (ref 30.0–100.0)

## 2019-07-21 LAB — T3: T3, Total: 133 ng/dL (ref 71–180)

## 2019-07-21 LAB — TSH: TSH: 0.671 u[IU]/mL (ref 0.450–4.500)

## 2019-08-02 MED FILL — CHLORTHALIDONE 25 MG TABS: 25 | 90 days supply | Qty: 90 | Fill #2

## 2019-08-03 ENCOUNTER — Ambulatory Visit (INDEPENDENT_AMBULATORY_CARE_PROVIDER_SITE_OTHER): Payer: No Typology Code available for payment source | Admitting: Family Medicine

## 2019-08-03 ENCOUNTER — Encounter (INDEPENDENT_AMBULATORY_CARE_PROVIDER_SITE_OTHER): Payer: Self-pay | Admitting: Family Medicine

## 2019-08-03 ENCOUNTER — Other Ambulatory Visit: Payer: Self-pay

## 2019-08-03 VITALS — BP 109/70 | HR 54 | Temp 98.3°F | Ht 66.0 in | Wt 234.0 lb

## 2019-08-03 DIAGNOSIS — R0683 Snoring: Secondary | ICD-10-CM | POA: Diagnosis not present

## 2019-08-03 DIAGNOSIS — I10 Essential (primary) hypertension: Secondary | ICD-10-CM | POA: Diagnosis not present

## 2019-08-03 DIAGNOSIS — R7303 Prediabetes: Secondary | ICD-10-CM | POA: Diagnosis not present

## 2019-08-03 DIAGNOSIS — E559 Vitamin D deficiency, unspecified: Secondary | ICD-10-CM

## 2019-08-03 DIAGNOSIS — Z9189 Other specified personal risk factors, not elsewhere classified: Secondary | ICD-10-CM | POA: Diagnosis not present

## 2019-08-03 DIAGNOSIS — Z6837 Body mass index (BMI) 37.0-37.9, adult: Secondary | ICD-10-CM

## 2019-08-07 ENCOUNTER — Ambulatory Visit (INDEPENDENT_AMBULATORY_CARE_PROVIDER_SITE_OTHER): Payer: No Typology Code available for payment source | Admitting: Bariatrics

## 2019-08-08 MED FILL — AMLODIPINE-VALSARTAN 10-320: 10-320 | 90 days supply | Qty: 90 | Fill #3

## 2019-08-08 NOTE — Progress Notes (Signed)
Chief Complaint:   OBESITY Christine Eaton is here to discuss her progress with her obesity treatment plan along with follow-up of her obesity related diagnoses. Christine Eaton is on the Category 1 Plan and states she is following her eating plan approximately 100% of the time. Christine Eaton states she is exercising for 0 minutes 0 times per week.  Today's visit was #: 2 Starting weight: 238 lbs Starting date: 07/20/2019 Today's weight: 234 lbs Today's date: 08/03/2019 Total lbs lost to date: 4 lbs Total lbs lost since last in-office visit: 4 lbs  Interim History: Christine Eaton is doing well with following the diet plan.  She is only drinking 32 ounces of water daily.  Subjective:   1. Essential hypertension Review: taking medications as instructed, no medication side effects noted, no chest pain on exertion, no dyspnea on exertion, no swelling of ankles.  Blood pressure is controlled with multiple medictions.  At goal today.    BP Readings from Last 3 Encounters:  08/03/19 109/70  07/20/19 112/74  01/23/16 107/73   2. Vitamin D deficiency Christine Eaton's Vitamin D level was 68.5 on 07/20/2019. She is currently taking OTC vitamin D 5000 units each day. She denies nausea, vomiting or muscle weakness.     3. Prediabetes Christine Eaton has a diagnosis of prediabetes based on her elevated HgA1c and was informed this puts her at greater risk of developing diabetes. She continues to work on diet and exercise to decrease her risk of diabetes. She denies nausea or hypoglycemia.  Her A1c was 6.4 with her PCP in March 2021.  4. Snoring, with apneic episodes She has an appointment scheduled with Dr. Brett Fairy on 08/23/2019.  5. At risk for complication associated with hypotension The patient is at a higher than average risk of hypotension due to weight loss and medication.  Assessment/Plan:   1. Essential hypertension Christine Eaton is working on healthy weight loss and exercise to improve blood pressure control. We will monitor  closely for signs of hypotension as she continues her lifestyle modifications.  2. Vitamin D deficiency Low Vitamin D level contributes to fatigue and are associated with obesity, breast, and colon cancer. She agrees to continue to take OTC Vitamin D @5 ,000 IU daily and will follow-up for routine testing of Vitamin D, at least 2-3 times per year to avoid over-replacement.  Vitamin D level is currently at goal.  3. Prediabetes Christine Eaton will continue to work on weight loss, exercise, and decreasing simple carbohydrates to help decrease the risk of diabetes.   4. Snoring, with apneic episodes Follow-up with Dr. Brett Fairy for sleep evaluation.  5. At risk for complication associated with hypotension Christine Eaton was given approximately 15 minutes of education and counseling today to help avoid hypotension. We discussed risks of hypotension with weight loss and signs of hypotension such as feeling lightheaded or unsteady.  Repetitive spaced learning was employed today to elicit superior memory formation and behavioral change.  6. Class 2 severe obesity with serious comorbidity and body mass index (BMI) of 37.0 to 37.9 in adult, unspecified obesity type (HCC) Christine Eaton is currently in the action stage of change. As such, her goal is to continue with weight loss efforts. She has agreed to the Category 1 Plan.   Exercise goals: No exercise has been prescribed at this time.  Behavioral modification strategies: increasing water intake.  Christine Eaton has agreed to follow-up with our clinic in 2 weeks. She was informed of the importance of frequent follow-up visits to maximize her success with intensive lifestyle  modifications for her multiple health conditions.   Objective:   Blood pressure 109/70, pulse (!) 54, temperature 98.3 F (36.8 C), temperature source Oral, height 5\' 6"  (1.676 m), weight 234 lb (106.1 kg), SpO2 97 %. Body mass index is 37.77 kg/m.  General: Cooperative, alert, well developed, in no  acute distress. HEENT: Conjunctivae and lids unremarkable. Cardiovascular: Regular rhythm.  Lungs: Normal work of breathing. Neurologic: No focal deficits.   Lab Results  Component Value Date   CREATININE 0.68 07/20/2019   BUN 14 07/20/2019   NA 141 07/20/2019   K 3.6 07/20/2019   CL 100 07/20/2019   CO2 23 07/20/2019   Lab Results  Component Value Date   ALT 19 07/20/2019   AST 21 07/20/2019   ALKPHOS 94 07/20/2019   BILITOT 0.4 07/20/2019   Lab Results  Component Value Date   TSH 0.671 07/20/2019   Lab Results  Component Value Date   CHOL 189 07/20/2019   HDL 65 07/20/2019   LDLCALC 115 (H) 07/20/2019   TRIG 48 07/20/2019   CHOLHDL 2.9 07/20/2019   Lab Results  Component Value Date   WBC 3.3 (L) 07/20/2019   HGB 13.7 07/20/2019   HCT 41.2 07/20/2019   MCV 80 07/20/2019   PLT 351 07/20/2019   Lab Results  Component Value Date   IRON 91 07/20/2019   TIBC 325 07/20/2019   FERRITIN 269 (H) 07/20/2019   Attestation Statements:   Reviewed by clinician on day of visit: allergies, medications, problem list, medical history, surgical history, family history, social history, and previous encounter notes.  I, Water quality scientist, CMA, am acting as Location manager for [employer], DO.  I have reviewed the above documentation for accuracy and completeness, and I agree with the above. Briscoe Deutscher, DO

## 2019-08-09 ENCOUNTER — Encounter: Payer: Self-pay | Admitting: Podiatry

## 2019-08-09 ENCOUNTER — Other Ambulatory Visit: Payer: Self-pay | Admitting: Podiatry

## 2019-08-09 DIAGNOSIS — L600 Ingrowing nail: Secondary | ICD-10-CM

## 2019-08-09 DIAGNOSIS — D2370 Other benign neoplasm of skin of unspecified lower limb, including hip: Secondary | ICD-10-CM | POA: Diagnosis not present

## 2019-08-09 MED ORDER — CEPHALEXIN 500 MG PO CAPS: 500.0000 mg | ORAL_CAPSULE | Freq: Two times a day (BID) | ORAL | 0 refills | Status: DC

## 2019-08-09 MED ORDER — HYDROCODONE-ACETAMINOPHEN 5-325 MG PO TABS: 1.0000 | ORAL_TABLET | Freq: Four times a day (QID) | ORAL | 0 refills | Status: DC | PRN

## 2019-08-09 MED FILL — CEPHALEXIN 500 MG CAPSULE: 500 | 7 days supply | Qty: 14 | Fill #0

## 2019-08-09 MED FILL — HYDROCODON-APAP 5-325: 5-325 | 3 days supply | Qty: 15 | Fill #0

## 2019-08-09 NOTE — Progress Notes (Signed)
Rx sent to pharmacy for outpatient surgery. °

## 2019-08-10 ENCOUNTER — Telehealth: Payer: Self-pay | Admitting: Podiatry

## 2019-08-10 ENCOUNTER — Telehealth: Payer: Self-pay | Admitting: *Deleted

## 2019-08-10 NOTE — Telephone Encounter (Signed)
Called pt for post-op check. No answer. VM left.

## 2019-08-10 NOTE — Telephone Encounter (Signed)
Pt states she had surgery yesterday and she is having pain she can not get rid of.

## 2019-08-10 NOTE — Telephone Encounter (Signed)
I spoke with pt and she states she is having an aching in the sureegy toe she cannot get rid of. I instructed pt to remove the surgery boot, sock and ace wrap only, then elevate the foot for 15 minutes, after 15 minutes place foot level with the hip and rewrap the ace beginning at the base of the toes and roll the ace just snug enough to hold in place down the foot and up the leg, replace the sock and boot. I told pt if she tolerated ibuprofen she could take 800mg  ibuprofen 3 times a day in between the doses of the narcotic pain medication. Pt states understanding.

## 2019-08-16 ENCOUNTER — Encounter: Payer: Self-pay | Admitting: Podiatry

## 2019-08-17 ENCOUNTER — Ambulatory Visit (INDEPENDENT_AMBULATORY_CARE_PROVIDER_SITE_OTHER): Payer: No Typology Code available for payment source | Admitting: Podiatry

## 2019-08-17 ENCOUNTER — Other Ambulatory Visit: Payer: Self-pay

## 2019-08-17 DIAGNOSIS — L720 Epidermal cyst: Secondary | ICD-10-CM

## 2019-08-17 DIAGNOSIS — Z9889 Other specified postprocedural states: Secondary | ICD-10-CM

## 2019-08-23 ENCOUNTER — Encounter: Payer: Self-pay | Admitting: Neurology

## 2019-08-23 ENCOUNTER — Other Ambulatory Visit (HOSPITAL_COMMUNITY): Payer: Self-pay | Admitting: Obstetrics and Gynecology

## 2019-08-23 ENCOUNTER — Ambulatory Visit: Payer: No Typology Code available for payment source | Admitting: Neurology

## 2019-08-23 ENCOUNTER — Other Ambulatory Visit: Payer: Self-pay

## 2019-08-23 VITALS — BP 102/71 | HR 67 | Ht 66.0 in | Wt 232.0 lb

## 2019-08-23 DIAGNOSIS — D219 Benign neoplasm of connective and other soft tissue, unspecified: Secondary | ICD-10-CM | POA: Diagnosis not present

## 2019-08-23 DIAGNOSIS — I10 Essential (primary) hypertension: Secondary | ICD-10-CM | POA: Diagnosis not present

## 2019-08-23 DIAGNOSIS — R0683 Snoring: Secondary | ICD-10-CM

## 2019-08-23 DIAGNOSIS — E876 Hypokalemia: Secondary | ICD-10-CM

## 2019-08-23 HISTORY — DX: Snoring: R06.83

## 2019-08-23 NOTE — Patient Instructions (Signed)
Screening for Sleep Apnea  Sleep apnea is a condition in which breathing pauses or becomes shallow during sleep. Sleep apnea screening is a test to determine if you are at risk for sleep apnea. The test is easy and only takes a few minutes. Your health care provider may ask you to have this test in preparation for surgery or as part of a physical exam. What are the symptoms of sleep apnea? Common symptoms of sleep apnea include:  Snoring.  Restless sleep.  Daytime sleepiness.  Pauses in breathing.  Choking during sleep.  Irritability.  Forgetfulness.  Trouble thinking clearly.  Depression.  Personality changes. Most people with sleep apnea are not aware that they have it. Why should I get screened? Getting screened for sleep apnea can help:  Ensure your safety. It is important for your health care providers to know whether or not you have sleep apnea, especially if you are having surgery or have other long-term (chronic) health conditions.  Improve your health and allow you to get a better night's rest. Restful sleep can help you: ? Have more energy. ? Lose weight. ? Improve high blood pressure. ? Improve diabetes management. ? Prevent stroke. ? Prevent car accidents. How is screening done? Screening usually includes being asked a list of questions about your sleep quality. Some questions you may be asked include:  Do you snore?  Is your sleep restless?  Do you have daytime sleepiness?  Has a partner or spouse told you that you stop breathing during sleep?  Have you had trouble concentrating or memory loss? If your screening test is positive, you are at risk for the condition. Further testing may be needed to confirm a diagnosis of sleep apnea. Where to find more information You can find screening tools online or at your health care clinic. For more information about sleep apnea screening and healthy sleep, visit these websites:  Centers for Disease Control and  Prevention: www.cdc.gov/sleep/index.html  American Sleep Apnea Association: www.sleepapnea.org Contact a health care provider if:  You think that you may have sleep apnea. Summary  Sleep apnea screening can help determine if you are at risk for sleep apnea.  It is important for your health care providers to know whether or not you have sleep apnea, especially if you are having surgery or have other chronic health conditions.  You may be asked to take a screening test for sleep apnea in preparation for surgery or as part of a physical exam. This information is not intended to replace advice given to you by your health care provider. Make sure you discuss any questions you have with your health care provider. Document Revised: 01/07/2018 Document Reviewed: 07/03/2016 Elsevier Patient Education  2020 Elsevier Inc. Quality Sleep Information, Adult Quality sleep is important for your mental and physical health. It also improves your quality of life. Quality sleep means you:  Are asleep for most of the time you are in bed.  Fall asleep within 30 minutes.  Wake up no more than once a night.  Are awake for no longer than 20 minutes if you do wake up during the night. Most adults need 7-8 hours of quality sleep each night. How can poor sleep affect me? If you do not get enough quality sleep, you may have:  Mood swings.  Daytime sleepiness.  Confusion.  Decreased reaction time.  Sleep disorders, such as insomnia and sleep apnea.  Difficulty with: ? Solving problems. ? Coping with stress. ? Paying attention. These issues may   affect your performance and productivity at work, school, and at home. Lack of sleep may also put you at higher risk for accidents, suicide, and risky behaviors. If you do not get quality sleep you may also be at higher risk for several health problems, including:  Infections.  Type 2 diabetes.  Heart disease.  High blood  pressure.  Obesity.  Worsening of long-term conditions, like arthritis, kidney disease, depression, Parkinson's disease, and epilepsy. What actions can I take to get more quality sleep?      Stick to a sleep schedule. Go to sleep and wake up at about the same time each day. Do not try to sleep less on weekdays and make up for lost sleep on weekends. This does not work.  Try to get about 30 minutes of exercise on most days. Do not exercise 2-3 hours before going to bed.  Limit naps during the day to 30 minutes or less.  Do not use any products that contain nicotine or tobacco, such as cigarettes or e-cigarettes. If you need help quitting, ask your health care provider.  Do not drink caffeinated beverages for at least 8 hours before going to bed. Coffee, tea, and some sodas contain caffeine.  Do not drink alcohol close to bedtime.  Do not eat large meals close to bedtime.  Do not take naps in the late afternoon.  Try to get at least 30 minutes of sunlight every day. Morning sunlight is best.  Make time to relax before bed. Reading, listening to music, or taking a hot bath promotes quality sleep.  Make your bedroom a place that promotes quality sleep. Keep your bedroom dark, quiet, and at a comfortable room temperature. Make sure your bed is comfortable. Take out sleep distractions like TV, a computer, smartphone, and bright lights.  If you are lying awake in bed for longer than 20 minutes, get up and do a relaxing activity until you feel sleepy.  Work with your health care provider to treat medical conditions that may affect sleeping, such as: ? Nasal obstruction. ? Snoring. ? Sleep apnea and other sleep disorders.  Talk to your health care provider if you think any of your prescription medicines may cause you to have difficulty falling or staying asleep.  If you have sleep problems, talk with a sleep consultant. If you think you have a sleep disorder, talk with your health  care provider about getting evaluated by a specialist. Where to find more information  National Sleep Foundation website: https://sleepfoundation.org  National Heart, Lung, and Blood Institute (NHLBI): www.nhlbi.nih.gov/files/docs/public/sleep/healthy_sleep.pdf  Centers for Disease Control and Prevention (CDC): www.cdc.gov/sleep/index.html Contact a health care provider if you:  Have trouble getting to sleep or staying asleep.  Often wake up very early in the morning and cannot get back to sleep.  Have daytime sleepiness.  Have daytime sleep attacks of suddenly falling asleep and sudden muscle weakness (narcolepsy).  Have a tingling sensation in your legs with a strong urge to move your legs (restless legs syndrome).  Stop breathing briefly during sleep (sleep apnea).  Think you have a sleep disorder or are taking a medicine that is affecting your quality of sleep. Summary  Most adults need 7-8 hours of quality sleep each night.  Getting enough quality sleep is an important part of health and well-being.  Make your bedroom a place that promotes quality sleep and avoid things that may cause you to have poor sleep, such as alcohol, caffeine, smoking, and large meals.  Talk to   your health care provider if you have trouble falling asleep or staying asleep. This information is not intended to replace advice given to you by your health care provider. Make sure you discuss any questions you have with your health care provider. Document Revised: 06/30/2017 Document Reviewed: 06/30/2017 Elsevier Patient Education  2020 Elsevier Inc.  

## 2019-08-23 NOTE — Progress Notes (Signed)
SLEEP MEDICINE CLINIC    Provider:  Larey Seat, MD  Primary Care Physician:  Lavone Orn, MD [address] [place] [address]     Referring Provider: Dr. Juleen China, Danae Chen, DO.         Chief Complaint according to patient   Patient presents with:    . New Patient (Initial Visit)           HISTORY OF PRESENT ILLNESS:  Christine Eaton is a 59  Year- old  African American female patient is seen here upon referral on 08/23/2019 from Dr Danae Chen wallace, DO.   Chief concern according to patient : " I have been told that I snore "   I have the pleasure of seeing Christine Eaton today, a right -handed African American female with a possible sleep disorder.  She reports being a snorer but not waking up from snoring or choking. She  has a past medical history of Anxiety, Back pain, Depression, Hypertension, Infertility, female, Knee pain, Mass of right hand (09/2015), and Neck pain.Marland Kitchen Recently she has developed nocturia times 2 , after increase in fluid intake.    Sleep relevant medical history: no ENT surgery, no trauma.    Family medical /sleep history: no other known family member on CPAP with OSA, with insomnia, or any sleep walkers.    Social history: Patient is working as a Charity fundraiser at [employer]- and lives in a household alone. The patient currently works in daytime. Tobacco use- remote use 2017 ; ETOH use : socially,  Caffeine intake in form of Coffee( none ) Soda(none ) Tea ( 2-3 / week) , nor energy drinks. Regular exercise: none .  Hobbies : gardening , floristics. No pets in the home.     Sleep habits are as follows: The patient's dinner time is between 6- 7 PM.  The patient goes to bed at 11 PM and continues to sleep for 4-7 hours, wakes for 1-2 bathroom breaks. The preferred sleep position is on her left side , with the support of 2 pillows.  Dreams are reportedly rare.  6.30 AM is the usual rise time. The patient wakes up with an alarm, set  for 6.15 AM .  She reports  feeling refreshed and restored in AM,  Naps are never , ever taken.    Review of Systems: Out of a complete 14 system review, the patient complains of only the following symptoms, and all other reviewed systems are negative.:  , snoring- reportedly, feels sleepy after lunch.    How likely are you to doze in the following situations: 0 = not likely, 1 = slight chance, 2 = moderate chance, 3 = high chance   Sitting and Reading? Watching Television? Sitting inactive in a public place (theater or meeting)? As a passenger in a car for an hour without a break? Lying down in the afternoon when circumstances permit? Sitting and talking to someone? Sitting quietly after lunch without alcohol? In a car, while stopped for a few minutes in traffic?   Total = 6/ 24 points   Anxiety and depression has been hightened during the pandemic.   She received both shots of pfizer. COVID 19 vaccine.   Social History   Socioeconomic History  . Marital status: Widowed    Spouse name: Not on file  . Number of children: Not on file  . Years of education: Not on file  . Highest education level: Not on file  Occupational History  . Occupation: phlebotomist  Tobacco Use  . Smoking status: Never Smoker  . Smokeless tobacco: Never Used  Substance and Sexual Activity  . Alcohol use: Yes    Comment: occasionally  . Drug use: No  . Sexual activity: Not Currently    Birth control/protection: Surgical  Other Topics Concern  . Not on file  Social History Narrative  . Not on file   Social Determinants of Health   Financial Resource Strain:   . Difficulty of Paying Living Expenses:   Food Insecurity:   . Worried About Charity fundraiser in the Last Year:   . Arboriculturist in the Last Year:   Transportation Needs:   . Film/video editor (Medical):   Marland Kitchen Lack of Transportation (Non-Medical):   Physical Activity:   . Days of Exercise per Week:   . Minutes of  Exercise per Session:   Stress:   . Feeling of Stress :   Social Connections:   . Frequency of Communication with Friends and Family:   . Frequency of Social Gatherings with Friends and Family:   . Attends Religious Services:   . Active Member of Clubs or Organizations:   . Attends Archivist Meetings:   Marland Kitchen Marital Status:     Family History  Problem Relation Age of Onset  . Hypertension Mother   . Cancer Mother        OVARIAN  . Obesity Mother   . Diabetes Father   . Cancer Father        LUNG  . Hypertension Father   . Obesity Father   . Hypertension Maternal Aunt   . Hypertension Maternal Uncle   . Hypertension Maternal Aunt   . Colon cancer Neg Hx   . Breast cancer Neg Hx     Past Medical History:  Diagnosis Date  . Anxiety   . Back pain   . Depression   . Hypertension    states under control with meds., has been on med. > 15 yr.  . Infertility, female   . Knee pain   . Mass of right hand 09/2015  . Neck pain     Past Surgical History:  Procedure Laterality Date  . BREAST EXCISIONAL BIOPSY Right 2005  . BREAST MASS EXCISION Right 08/23/2003  . CHROMOPERTUBATION  04/15/2001  . COLONOSCOPY  06/25/2011   Procedure: COLONOSCOPY;  Surgeon: Garlan Fair, MD;  Location: [facility];  Service: Endoscopy;  Laterality: N/A;  MAC  . EXCISION OF ENDOMETRIOMA Right 04/15/2001   right tube  . INGUINAL HERNIA REPAIR Left   . KNEE ARTHROSCOPY  02/02/2012   Procedure: ARTHROSCOPY KNEE;  Surgeon: Lorn Junes, MD;  Location: [facility];  Service: Orthopedics;  Laterality: Left;  ARTHROSCOPY KNEE WITH DEBRIDEMENT/SHAVING (CHONDROPLASTY)  . LAPAROSCOPIC LYSIS OF ADHESIONS  04/15/2001; 05/24/2007  . MASS EXCISION Right 10/02/2015   Procedure: EXCISION OF RIGHT HAND  MASS;  Surgeon: Charlotte Crumb, MD;  Location: [facility];  Service: Orthopedics;  Laterality: Right;  . REPAIR VAGINAL CUFF  06/18/2007  . ROBOTIC ASSISTED TOTAL  HYSTERECTOMY WITH BILATERAL SALPINGO OOPHERECTOMY  05/24/2007     Current Outpatient Medications on File Prior to Visit  Medication Sig Dispense Refill  . amLODipine-valsartan (EXFORGE) 10-320 MG per tablet Take 1 tablet by mouth daily.    . Ascorbic Acid (VITAMIN C) 500 MG CAPS Take 1 capsule by mouth daily.    . chlorthalidone (HYGROTON) 25  MG tablet Take 25 mg by mouth daily.    . Cholecalciferol (VITAMIN D-3) 125 MCG (5000 UT) TABS Take 1 tablet by mouth daily.    . Multiple Vitamin (MULTIVITAMIN) tablet Take 1 tablet by mouth daily.    . potassium chloride (K-DUR,KLOR-CON) 10 MEQ tablet Take 10 mEq by mouth daily.   3   No current facility-administered medications on file prior to visit.    Allergies  Allergen Reactions  . Lavender Oil Hives  . Citalopram Hydrobromide Other (See Comments)    Pt stated, "weight gain"  . Lisinopril Cough    Physical exam:  Today's Vitals   08/23/19 1115  BP: 102/71  Pulse: 67  Weight: 232 lb (105.2 kg)  Height: _0  (1.676 m)   Body mass index is 37.45 kg/m.   Wt Readings from Last 3 Encounters:  08/23/19 232 lb (105.2 kg)  08/03/19 234 lb (106.1 kg)  07/20/19 238 lb (108 kg)     Ht Readings from Last 3 Encounters:  08/23/19 _1  (1.676 m)  08/03/19 _2  (1.676 m)  07/20/19 _3  (1.676 m)      General: The patient is awake, alert and appears not in acute distress. The patient is well groomed. Head: Normocephalic, atraumatic. Neck is supple. Mallampati 3,  neck circumference:15. 25  inches . Nasal airflow patent.   Retrognathia is seen.  Dental status: intact  Cardiovascular:  Regular rate and cardiac rhythm by pulse,  without distended neck veins. Respiratory: Lungs are clear to auscultation.  Skin:  Without evidence of ankle edema, or rash. Trunk: The patient's posture is erect.   Neurologic exam : The patient is awake and alert, oriented to place and time.   Memory subjective described as intact.  Attention span &  concentration ability appears normal.  Speech is fluent,  without  dysarthria, dysphonia or aphasia.  Mood and affect are appropriate.   Cranial nerves: no loss of smell or taste reported  Pupils are equal in shape and size- round and  and briskly reactive to light. Funduscopic exam deferred.-   Extraocular movements in vertical and horizontal planes were intact and without nystagmus. No Diplopia. Visual fields by finger perimetry are intact. Hearing was intact to finger rubbing.    Facial sensation intact to fine touch.  Facial motor strength is symmetric and tongue and uvula move midline.  Neck ROM : rotation, tilt and flexion extension were normal for age and shoulder shrug was symmetrical.    Motor exam:  Symmetric bulk, tone and ROM.   Normal tone without cog- wheeling, symmetric grip strength .   Sensory:  Fine touch, pinprick and vibration were tested  and  normal.  Numbness on the left hands ulnar distribution.  Proprioception tested in the upper extremities was normal.   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 normal penmanship.  The Finger-to-nose maneuver was intact without evidence of ataxia, dysmetria or tremor.   Gait and station: Patient could rise unassisted from a seated position, walked without assistive device. She has the left foot in a boot today  Stance is of normal width/ base. Toe and heel walk were deferred.  Deep tendon reflexes: in the  upper and lower extremities are symmetric and intact.  Babinski response was deferred.       After spending a total time of  45 minutes face to face and additional time for physical and neurologic examination, review of  laboratory studies,  personal review of imaging studies, reports and results of other testing and review of referral information / records as far as provided in visit, I have established the following assessments:  1)  OSA is a  concern in a patient with BMI 37.5 and larger neck,but she has not reported EDS or high degree of fatigue, she was told she snores, but hasn't been waking up from choking or gasping.  2)  Left ulnar nerve irritation. Likely related to job -   My Plan is to proceed with: HST should be sufficient to screen for apnea.   This will hopefully help with weight loss.    I would like to thank Dr Juleen China  for allowing me to meet with and to take care of this pleasant patient.   In short, Christine Eaton is presenting with snoring, obesity and new onset nocturia.   I plan to follow up either personally or through our NP within 2-3 month.   CC: I will share my notes with PCP   Electronically signed by: Larey Seat, MD 08/23/2019 11:32 AM  [facility] [place] certified by The [employer] of Sleep Medicine and Diplomate of the [employer] of Sleep Medicine. Board certified In Neurology through the Haivana Nakya, Fellow of the [employer] of Neurology. Medical Director of [employer].

## 2019-08-24 ENCOUNTER — Ambulatory Visit (INDEPENDENT_AMBULATORY_CARE_PROVIDER_SITE_OTHER): Payer: No Typology Code available for payment source | Admitting: Bariatrics

## 2019-08-24 ENCOUNTER — Encounter (INDEPENDENT_AMBULATORY_CARE_PROVIDER_SITE_OTHER): Payer: Self-pay | Admitting: Bariatrics

## 2019-08-24 ENCOUNTER — Other Ambulatory Visit: Payer: Self-pay

## 2019-08-24 VITALS — BP 112/76 | HR 93 | Temp 98.3°F | Ht 66.0 in | Wt 232.0 lb

## 2019-08-24 DIAGNOSIS — Z6837 Body mass index (BMI) 37.0-37.9, adult: Secondary | ICD-10-CM

## 2019-08-24 DIAGNOSIS — R7303 Prediabetes: Secondary | ICD-10-CM | POA: Diagnosis not present

## 2019-08-24 DIAGNOSIS — I10 Essential (primary) hypertension: Secondary | ICD-10-CM

## 2019-08-25 ENCOUNTER — Encounter: Payer: Self-pay | Admitting: Podiatry

## 2019-08-25 ENCOUNTER — Ambulatory Visit (INDEPENDENT_AMBULATORY_CARE_PROVIDER_SITE_OTHER): Payer: No Typology Code available for payment source | Admitting: Podiatry

## 2019-08-25 VITALS — Temp 97.2°F

## 2019-08-25 DIAGNOSIS — L600 Ingrowing nail: Secondary | ICD-10-CM

## 2019-08-25 DIAGNOSIS — L720 Epidermal cyst: Secondary | ICD-10-CM

## 2019-08-25 DIAGNOSIS — L03032 Cellulitis of left toe: Secondary | ICD-10-CM

## 2019-08-27 NOTE — Progress Notes (Signed)
  Subjective:  Patient ID: Christine Eaton, female    DOB: 11-04-1960,  MRN: [id number]  Chief Complaint  Patient presents with  . Nail Check    Follow-up; Right foot; Hallux-medial side; pt stated, "Still feels sore; has no drainage; when can I go back to work?"    59 y.o. female presents for follow up of nail procedure. History confirmed with patient.   Objective:  Physical Exam: Ingrown nail avulsion site: overlying soft crust, no warmth, no drainage and no erythema. Sutures intact Assessment:   1. Epidermoid cyst   2. Paronychia of great toe, left   3. Ingrown nail    Plan:  Patient was evaluated and treated and all questions answered.  S/p Cyst Excision, right -Healing well. -Start soaking to allow for dissolving of sutures -Ok to trial normal shoes -Ok to return to work next week if she can tolerate shoegear

## 2019-08-28 ENCOUNTER — Encounter (INDEPENDENT_AMBULATORY_CARE_PROVIDER_SITE_OTHER): Payer: Self-pay | Admitting: Bariatrics

## 2019-08-28 ENCOUNTER — Ambulatory Visit (INDEPENDENT_AMBULATORY_CARE_PROVIDER_SITE_OTHER): Payer: No Typology Code available for payment source | Admitting: Family Medicine

## 2019-08-28 NOTE — Progress Notes (Signed)
Chief Complaint:   OBESITY Christine Eaton is here to discuss her progress with her obesity treatment plan along with follow-up of her obesity related diagnoses. [name] is on the Category 1 Plan and states she is following her eating plan approximately 60% of the time. [name] states she is exercising 0 minutes 0 times per week.  Today's visit was #: 3 Starting weight: 238 lbs Starting date: 07/20/2019 Today's weight: 232 lbs Today's date: 08/24/2019 Total lbs lost to date: 6 Total lbs lost since last in-office visit: 2  Interim History: Christine Eaton is down 2 lbs and has done well overall. She reports not being as active (shoe on left foot). She reports doing well with her water intake.  Subjective:   Essential hypertension. Christine Eaton is taking Exforge and HCTZ.  BP Readings from Last 3 Encounters:  08/24/19 112/76  08/23/19 102/71  08/03/19 109/70   Lab Results  Component Value Date   CREATININE 0.68 07/20/2019   CREATININE 0.60 10/02/2015   CREATININE 0.68 01/28/2012   Prediabetes. Christine Eaton has a diagnosis of prediabetes based on her elevated HgA1c and was informed this puts her at greater risk of developing diabetes. She continues to work on diet and exercise to decrease her risk of diabetes. She denies nausea or hypoglycemia. Christine Eaton is on no medication.  No results found for: HGBA1C No results found for: INSULIN  Assessment/Plan:   Essential hypertension. [name] is working on healthy weight loss and exercise to improve blood pressure control. We will watch for signs of hypotension as she continues her lifestyle modifications. She will continue her medications as directed.  Prediabetes. Christine Eaton will continue to work on weight loss, exercise, increasing healthy fats and protein, and decreasing simple carbohydrates to help decrease the risk of diabetes.   Class 2 severe obesity with serious comorbidity and body mass index (BMI) of 37.0 to 37.9 in adult, unspecified  obesity type ([place]).  Christine Eaton is currently in the action stage of change. As such, her goal is to continue with weight loss efforts. She has agreed to the Category 1 Plan.   She will work on meal planning and intentional eating.  Handout was given for "[place]."  She will meet her calorie and protein goals with each meal.  Exercise goals: All adults should avoid inactivity. Some physical activity is better than none, and adults who participate in any amount of physical activity gain some health benefits.  Behavioral modification strategies: increasing lean protein intake, decreasing simple carbohydrates, increasing vegetables, increasing water intake, decreasing eating out, no skipping meals, meal planning and cooking strategies, keeping healthy foods in the home and planning for success.  Christine Eaton has agreed to follow-up with our clinic in 2 weeks. She was informed of the importance of frequent follow-up visits to maximize her success with intensive lifestyle modifications for her multiple health conditions.   Objective:   Blood pressure 112/76, pulse 93, temperature 98.3 F (36.8 C), height 5\' 6"  (1.676 m), weight 232 lb (105.2 kg), SpO2 96 %. Body mass index is 37.45 kg/m.  General: Cooperative, alert, well developed, in no acute distress. HEENT: Conjunctivae and lids unremarkable. Cardiovascular: Regular rhythm.  Lungs: Normal work of breathing. Neurologic: No focal deficits.   Lab Results  Component Value Date   CREATININE 0.68 07/20/2019   BUN 14 07/20/2019   NA 141 07/20/2019   K 3.6 07/20/2019   CL 100 07/20/2019   CO2 23 07/20/2019   Lab Results  Component Value Date  ALT 19 07/20/2019   AST 21 07/20/2019   ALKPHOS 94 07/20/2019   BILITOT 0.4 07/20/2019   No results found for: HGBA1C No results found for: INSULIN Lab Results  Component Value Date   TSH 0.671 07/20/2019   Lab Results  Component Value Date   CHOL 189 07/20/2019   HDL 65 07/20/2019    LDLCALC 115 (H) 07/20/2019   TRIG 48 07/20/2019   CHOLHDL 2.9 07/20/2019   Lab Results  Component Value Date   WBC 3.3 (L) 07/20/2019   HGB 13.7 07/20/2019   HCT 41.2 07/20/2019   MCV 80 07/20/2019   PLT 351 07/20/2019   Lab Results  Component Value Date   IRON 91 07/20/2019   TIBC 325 07/20/2019   FERRITIN 269 (H) 07/20/2019   Attestation Statements:   Reviewed by clinician on day of visit: allergies, medications, problem list, medical history, surgical history, family history, social history, and previous encounter notes.  Time spent on visit including pre-visit chart review and post-visit charting and care was 20 minutes.   Migdalia Dk, am acting as Location manager for [employer], DO   I have reviewed the above documentation for accuracy and completeness, and I agree with the above. Jearld Lesch, DO

## 2019-08-29 ENCOUNTER — Telehealth: Payer: Self-pay

## 2019-08-29 NOTE — Telephone Encounter (Signed)
LVM for pt to call me back to schedule sleep study  

## 2019-09-07 ENCOUNTER — Ambulatory Visit (INDEPENDENT_AMBULATORY_CARE_PROVIDER_SITE_OTHER): Payer: No Typology Code available for payment source | Admitting: Podiatry

## 2019-09-07 ENCOUNTER — Other Ambulatory Visit: Payer: Self-pay

## 2019-09-07 ENCOUNTER — Telehealth: Payer: Self-pay

## 2019-09-07 DIAGNOSIS — L720 Epidermal cyst: Secondary | ICD-10-CM

## 2019-09-07 DIAGNOSIS — L03032 Cellulitis of left toe: Secondary | ICD-10-CM

## 2019-09-07 NOTE — Telephone Encounter (Signed)
LVM for pt to call me back to schedule sleep study  

## 2019-09-07 NOTE — Progress Notes (Signed)
  Subjective:  Patient ID: Christine Eaton, female    DOB: Nov 23, 1960,  MRN: [id number]  No chief complaint on file.   59 y.o. female presents for follow up of nail procedure. Concerned about how the nail is looking - thinks it "looks terrible". Has been applying neosporin and soaking once weekly.   Objective:  Physical Exam: Ingrown nail avulsion site: overlying soft crust, no warmth, no drainage and no erythema. Wound healing well. Assessment:   1. Epidermoid cyst   2. Paronychia of great toe, left    Plan:  Patient was evaluated and treated and all questions answered.  S/p Cyst Excision, right -Wound is healing well gently curetted discussed it will improve with time. -Not having any pain. -Continue normal shoegear -Soak every other day. Apply neosporin. -F/u in 3 weeks for recheck.

## 2019-09-08 ENCOUNTER — Encounter: Payer: No Typology Code available for payment source | Admitting: Podiatry

## 2019-09-20 ENCOUNTER — Ambulatory Visit (INDEPENDENT_AMBULATORY_CARE_PROVIDER_SITE_OTHER): Payer: No Typology Code available for payment source | Admitting: Neurology

## 2019-09-20 ENCOUNTER — Other Ambulatory Visit: Payer: Self-pay

## 2019-09-20 ENCOUNTER — Ambulatory Visit (INDEPENDENT_AMBULATORY_CARE_PROVIDER_SITE_OTHER): Payer: No Typology Code available for payment source | Admitting: Family Medicine

## 2019-09-20 ENCOUNTER — Encounter (INDEPENDENT_AMBULATORY_CARE_PROVIDER_SITE_OTHER): Payer: Self-pay | Admitting: Family Medicine

## 2019-09-20 VITALS — BP 124/70 | HR 58 | Temp 98.5°F | Ht 66.0 in | Wt 233.0 lb

## 2019-09-20 DIAGNOSIS — M7050 Other bursitis of knee, unspecified knee: Secondary | ICD-10-CM

## 2019-09-20 DIAGNOSIS — Z9189 Other specified personal risk factors, not elsewhere classified: Secondary | ICD-10-CM | POA: Diagnosis not present

## 2019-09-20 DIAGNOSIS — R609 Edema, unspecified: Secondary | ICD-10-CM | POA: Diagnosis not present

## 2019-09-20 DIAGNOSIS — G4733 Obstructive sleep apnea (adult) (pediatric): Secondary | ICD-10-CM | POA: Diagnosis not present

## 2019-09-20 DIAGNOSIS — I10 Essential (primary) hypertension: Secondary | ICD-10-CM

## 2019-09-20 DIAGNOSIS — E876 Hypokalemia: Secondary | ICD-10-CM

## 2019-09-20 DIAGNOSIS — R0683 Snoring: Secondary | ICD-10-CM

## 2019-09-20 DIAGNOSIS — D219 Benign neoplasm of connective and other soft tissue, unspecified: Secondary | ICD-10-CM

## 2019-09-20 DIAGNOSIS — Z6837 Body mass index (BMI) 37.0-37.9, adult: Secondary | ICD-10-CM

## 2019-09-21 ENCOUNTER — Encounter (INDEPENDENT_AMBULATORY_CARE_PROVIDER_SITE_OTHER): Payer: Self-pay | Admitting: Family Medicine

## 2019-09-21 NOTE — Telephone Encounter (Signed)
Please review. Thanks!  

## 2019-09-22 MED FILL — POTASSIUM CHLORIDE CRYS ER: 10 | 90 days supply | Qty: 90 | Fill #3

## 2019-09-22 MED FILL — valACYclovir HCL 1 GM TABS: 1 | 90 days supply | Qty: 90 | Fill #0

## 2019-09-25 NOTE — Progress Notes (Signed)
Chief Complaint:   OBESITY [name] is here to discuss her progress with her obesity treatment plan along with follow-up of her obesity related diagnoses. Christine Eaton is on the Category 1 Plan and states she is following her eating plan approximately 80% of the time. [name] states she is exercising for 0 minutes 0 times per week.  Today's visit was #: 4 Starting weight: 238 lbs Starting date: 07/20/2019 Today's weight: 233 lbs Today's date: 09/20/2019 Total lbs lost to date: 5 lbs Total lbs lost since last in-office visit: 1 lbs  Interim History: Christine Eaton is up 7 pounds of water today.  She has increased her salt intake.  She says that she has right knee pain.  Subjective:   1. Pes anserine bursitis Christine Eaton is using Voltaren OTC.  2. Snoring She has a home sleep study scheduled for tonight.  3. Edema Christine Eaton has generalized edema.     4. At risk for heart disease Christine Eaton is at a higher than average risk for cardiovascular disease due to obesity.   Assessment/Plan:   1. Pes anserine bursitis Will place referral for [facility] to be seen by Sports Medicine.  Orders - Ambulatory referral to Sports Medicine  2. Snoring Will continue to monitor.  3. Edema Will monitor.  Consider changing diuretic.  4. At risk for heart disease Christine Eaton was given approximately 15 minutes of coronary artery disease prevention counseling today. She is 59 y.o. female and has risk factors for heart disease including obesity. We discussed intensive lifestyle modifications today with an emphasis on specific weight loss instructions and strategies.   Repetitive spaced learning was employed today to elicit superior memory formation and behavioral change.  5. Class 2 severe obesity with serious comorbidity and body mass index (BMI) of 37.0 to 37.9 in adult, unspecified obesity type (HCC) Christine Eaton is currently in the action stage of change. As such, her goal is to continue with weight loss efforts. She  has agreed to the Category 1 Plan.   Exercise goals: Upper body exercises were reviewed.  Behavioral modification strategies: increasing water intake and decreasing sodium intake.  Christine Eaton has agreed to follow-up with our clinic in 2-3 weeks. She was informed of the importance of frequent follow-up visits to maximize her success with intensive lifestyle modifications for her multiple health conditions.   Objective:   Blood pressure 124/70, pulse (!) 58, temperature 98.5 F (36.9 C), temperature source Oral, height 5\' 6"  (1.676 m), weight 233 lb (105.7 kg), SpO2 98 %. Body mass index is 37.61 kg/m.  General: Cooperative, alert, well developed, in no acute distress. HEENT: Conjunctivae and lids unremarkable. Cardiovascular: Regular rhythm.  Lungs: Normal work of breathing. Neurologic: No focal deficits.   Lab Results  Component Value Date   CREATININE 0.68 07/20/2019   BUN 14 07/20/2019   NA 141 07/20/2019   K 3.6 07/20/2019   CL 100 07/20/2019   CO2 23 07/20/2019   Lab Results  Component Value Date   ALT 19 07/20/2019   AST 21 07/20/2019   ALKPHOS 94 07/20/2019   BILITOT 0.4 07/20/2019   Lab Results  Component Value Date   TSH 0.671 07/20/2019   Lab Results  Component Value Date   CHOL 189 07/20/2019   HDL 65 07/20/2019   LDLCALC 115 (H) 07/20/2019   TRIG 48 07/20/2019   CHOLHDL 2.9 07/20/2019   Lab Results  Component Value Date   WBC 3.3 (L) 07/20/2019   HGB 13.7 07/20/2019   HCT 41.2 07/20/2019  MCV 80 07/20/2019   PLT 351 07/20/2019   Lab Results  Component Value Date   IRON 91 07/20/2019   TIBC 325 07/20/2019   FERRITIN 269 (H) 07/20/2019   Attestation Statements:   Reviewed by clinician on day of visit: allergies, medications, problem list, medical history, surgical history, family history, social history, and previous encounter notes.  I, Water quality scientist, CMA, am acting as transcriptionist for Briscoe Deutscher, DO  I have reviewed the above  documentation for accuracy and completeness, and I agree with the above. Briscoe Deutscher, DO

## 2019-10-05 ENCOUNTER — Other Ambulatory Visit: Payer: Self-pay

## 2019-10-05 ENCOUNTER — Ambulatory Visit (INDEPENDENT_AMBULATORY_CARE_PROVIDER_SITE_OTHER): Payer: No Typology Code available for payment source | Admitting: Podiatry

## 2019-10-05 DIAGNOSIS — L720 Epidermal cyst: Secondary | ICD-10-CM | POA: Diagnosis not present

## 2019-10-05 NOTE — Progress Notes (Signed)
  Subjective:  Patient ID: Christine Eaton, female    DOB: 02-15-1961,  MRN: [id number]  Chief Complaint  Patient presents with  . Routine Post Op    POV#3 DOS 5.5.2021 EXC BENIGN LESION LT, WEDGE EXCISION OF SKIN LT. Pt states "will it ever look normal?" wants to discuss healing. Pt has been doing epsom soaks and applying neosporin.    59 y.o. female presents for follow up of nail procedure. Concerned about appearance. Only occasional pain.   Objective:  Physical Exam: Ingrown nail avulsion site: overlying immature epithelium. Wound does appear healed. Assessment:   1. Epidermoid cyst    Plan:  Patient was evaluated and treated and all questions answered.  S/p Cyst Excision, right -Wound does appear healed. There is immature epithelialization that should mature with time. Advised d/c soaking and ointment. -F/u in 6 weeks for recheck

## 2019-10-12 ENCOUNTER — Encounter: Payer: Self-pay | Admitting: Neurology

## 2019-10-12 ENCOUNTER — Telehealth: Payer: Self-pay | Admitting: Neurology

## 2019-10-12 NOTE — Telephone Encounter (Signed)
I called pt. I advised pt that Dr. Brett Fairy reviewed their sleep study results and found that pt severe sleep apnea. Dr. Brett Fairy recommends that pt starts auto CPAP. I reviewed PAP compliance expectations with the pt. Pt is agreeable to starting a CPAP. I advised pt that an order will be sent to a DME, [facility] and [facility] will call the pt within about one week after they file with the pt's insurance. [facility]) will show the pt how to use the machine, fit for masks, and troubleshoot the CPAP if needed. A follow up appt was made for insurance purposes with Dr. Brett Fairy on 01/01/20 at 2:30 pm. Pt verbalized understanding to arrive 15 minutes early and bring their CPAP. A letter with all of this information in it will be mailed to the pt as a reminder. I verified with the pt that the address we have on file is correct. Pt verbalized understanding of results. Pt had no questions at this time but was encouraged to call back if questions arise. I have sent the order to [facility]) and have received confirmation that they have received the order.

## 2019-10-12 NOTE — Addendum Note (Signed)
Addended by: Larey Seat on: 10/12/2019 12:44 PM   Modules accepted: Orders

## 2019-10-12 NOTE — Progress Notes (Signed)
Overall AHI is indicative of severe sleep apnea at 60/h and was not REM sleep dependent. The AHI is highest in supine at 64.7/h. Intermittent sleep hypoxia and bradycardia were noted.   Recommendations:    This degree of apnea can be treated with CPAP/BiPAP or with an Inspire device (BMI need to reach 32 or less for Inspire).  The patient will also benefit from avoiding supine sleep and from weight loss.  I will proceed to order an autotitration CPAP device for a setting from 6-18 cm water, 2 cm EPR and heated humidity with a mask of patient's choice.  Interpreting Physician: Larey Seat, MD

## 2019-10-12 NOTE — Telephone Encounter (Signed)
Called patient to discuss sleep study results. No answer at this time. LVM for the patient to call back.   

## 2019-10-12 NOTE — Procedures (Signed)
  Patient Information     First Name: Christine Last Name: Eaton ID: [id number]  Birth Date: 04-17-2060 Age: 59 Gender: Female  Referring Provider: Briscoe Deutscher, DO  BMI: 37.2 (W=231 lb, H=5' 6'')  Neck Circ.:  15 '' Epworth:  6/24   Sleep Study Information    Study Date: 09/20/19 S/H/A Version: 001.001.001.001 / 4.0.1515 / 77  History:    Christine Eaton is a 59 y.o. right -handed African American female.  She reports being a snorer but not waking up from snoring or choking. She has a medical history of Anxiety, Back pain, Depression, Hypertension, Infertility, female, Knee pain, Mass of right hand (09/2015), and Neck pain. Recently she has developed nocturia times 2 after increase in fluid intake.  She is now followed at [facility] with Dr. Juleen China and is here for an evaluation of possible sleep apnea related to weight problems.         Summary & Diagnosis:     Overall AHI o is indicative of severe sleep apnea at 60/h and not REM dependent. The AHI is highest in supine at 64.7/h. Intermittent sleep hypoxia and bradycardia were noted.   Recommendations:     This degree of apnea can be treated with CPAP/BiPAP or with an Inspire device (BMI need to reach 32 or less for Inspire). I will proceed to order an autotitration CPAP device for a setting from 6-18 cm water, 2 cm EPR and heated humidity with a mask of patient's choice.  Interpreting Physician: Larey Seat, MD             Sleep Summary  Oxygen Saturation Statistics   Start Study Time: End Study Time: Total Recording Time:       10:42:35 PM 6:34:05 AM 7 h, 51 min  Total Sleep Time % REM of Sleep Time:  7 h, 12 min 16.8    Mean: 91 Minimum: 82 Maximum: 97  Mean of Desaturations Nadirs (%):   89  Oxygen Desaturation. %:   4-9 10-20 >20 Total  Events Number Total   204  2 99.0 1.0  0 0.0  206 100.0  Oxygen Saturation: <90 <=88 <85 <80 <70  Duration (minutes): Sleep % 26.1 6.0  9.7 0.7  2.2 0.2  0.0 0.0 0.0 0.0     Respiratory Indices      Total Events REM NREM All Night  pRDI:  306  pAHI:  302 ODI:  206  pAHIc:  9  % CSR: 0.0 54.5 53.3 30.3 2.4 62.2 61.5 43.2 1.7 61.0 60.2 41.0 1.8       Pulse Rate Statistics during Sleep (BPM)      Mean:  58 Minimum: 38  Maximum: 112    Indices are calculated using technically valid sleep time of 5 h, 1 min. pRDI/pAHI are calculated using 02 desaturations ? 3%  Body Position Statistics  Position Supine Prone Right Left Non-Supine  Sleep (min) 226.0 33.0 14.5 158.5 206.0  Sleep % 52.3 7.6 3.4 36.7 47.7  pRDI 84.4 N/A N/A 28.0 30.6  pAHI 84.1 N/A N/A 26.6 29.2  ODI 64.7 N/A N/A 10.4 10.5     Snoring Statistics Snoring Level (dB) >40 >50 >60 >70 >80 >Threshold (45)  Sleep (min) 367.8 78.5 3.3 0.0 0.0 195.1  Sleep % 85.1 18.2 0.8 0.0 0.0 45.2    Mean: 46 dB

## 2019-10-12 NOTE — Telephone Encounter (Signed)
-----   Message from Larey Seat, MD sent at 10/12/2019 12:44 PM EDT ----- Overall AHI is indicative of severe sleep apnea at 60/h and was not REM sleep dependent. The AHI is highest in supine at 64.7/h. Intermittent sleep hypoxia and bradycardia were noted.   Recommendations:    This degree of apnea can be treated with CPAP/BiPAP or with an Inspire device (BMI need to reach 32 or less for Inspire).  The patient will also benefit from avoiding supine sleep and from weight loss.  I will proceed to order an autotitration CPAP device for a setting from 6-18 cm water, 2 cm EPR and heated humidity with a mask of patient's choice.  Interpreting Physician: Larey Seat, MD

## 2019-10-13 ENCOUNTER — Encounter: Payer: Self-pay | Admitting: Neurology

## 2019-10-13 ENCOUNTER — Encounter (INDEPENDENT_AMBULATORY_CARE_PROVIDER_SITE_OTHER): Payer: Self-pay | Admitting: Family Medicine

## 2019-10-15 NOTE — Progress Notes (Signed)
  Subjective:  Patient ID: Christine Eaton, female    DOB: Sep 10, 1960,  MRN: [id number]  Chief Complaint  Patient presents with  . Cyst    Left foot 1st digit cyst surgical consult.    59 y.o. female presents with the above complaint. History confirmed with patient.  Had to cancel surgery previously but would like to get the surgery scheduled again.  Objective:  Physical Exam: warm, good capillary refill, no trophic changes or ulcerative lesions, normal DP and PT pulses and normal sensory exam. Left Foot: Painful recurrent ingrown nail left great toe with local fluctuant area Assessment:   1. Epidermoid cyst   2. Paronychia of great toe, left   3. Ingrown nail      Plan:  Patient was evaluated and treated and all questions answered.  Epidermoid cyst left great toe -Plan to proceed with excision of the cyst as previously planned -Patient has failed all conservative therapy and wishes to proceed with surgical intervention. All risks, benefits, and alternatives discussed with patient. No guarantees given. Consent reviewed and signed by patient. -Planned procedures: Excision of epidermoid cyst left great toe possible nail fold rearrangement   No follow-ups on file.

## 2019-10-16 ENCOUNTER — Encounter (INDEPENDENT_AMBULATORY_CARE_PROVIDER_SITE_OTHER): Payer: Self-pay | Admitting: Family Medicine

## 2019-10-16 ENCOUNTER — Ambulatory Visit (INDEPENDENT_AMBULATORY_CARE_PROVIDER_SITE_OTHER): Payer: No Typology Code available for payment source | Admitting: Family Medicine

## 2019-10-16 ENCOUNTER — Other Ambulatory Visit: Payer: Self-pay

## 2019-10-16 VITALS — BP 131/81 | HR 80 | Temp 98.1°F | Ht 66.0 in | Wt 230.0 lb

## 2019-10-16 DIAGNOSIS — G4733 Obstructive sleep apnea (adult) (pediatric): Secondary | ICD-10-CM

## 2019-10-16 DIAGNOSIS — E559 Vitamin D deficiency, unspecified: Secondary | ICD-10-CM

## 2019-10-16 DIAGNOSIS — I10 Essential (primary) hypertension: Secondary | ICD-10-CM

## 2019-10-16 DIAGNOSIS — Z9189 Other specified personal risk factors, not elsewhere classified: Secondary | ICD-10-CM | POA: Diagnosis not present

## 2019-10-16 DIAGNOSIS — Z6837 Body mass index (BMI) 37.0-37.9, adult: Secondary | ICD-10-CM

## 2019-10-17 NOTE — Progress Notes (Signed)
Chief Complaint:   OBESITY Christine Eaton is here to discuss her progress with her obesity treatment plan along with follow-up of her obesity related diagnoses. Christine Eaton is on the Category 1 Plan and states she is following her eating plan approximately 80% of the time. Christine Eaton states she is 59 using resistance bands for 10 minutes 3 times per week.  Today's visit was #: 5 Starting weight: 238 lbs Starting date: 07/20/2019 Today's weight: 230 lbs Today's date: 10/16/2019 Total lbs lost to date: 8 lbs Total lbs lost since last in-office visit: 3 lbs  Interim History: Christine Eaton says she is proud of herself for eating on plan and moving more.  She had a recent sleep study that showed severe sleep apnea.  Subjective:   1. OSA (obstructive sleep apnea) Christine Eaton has a diagnosis of severe sleep apnea. She reports that she is not using a CPAP regularly.  She is followed by Dr. Brett Fairy.  She is working on getting a CPAP machine.  2. Essential hypertension Review: taking medications as instructed, no medication side effects noted, no chest pain on exertion, no dyspnea on exertion, no swelling of ankles.  Blood pressure is not at goal.  This will likely be helped by CPAP treatment.  BP Readings from Last 3 Encounters:  10/16/19 131/81  09/20/19 124/70  08/24/19 112/76   3. Vitamin D deficiency Christine Eaton's Vitamin D level was 68.5 on 07/20/2019. She is currently taking OTC vitamin D 5000 IU each day. She denies nausea, vomiting or muscle weakness.  Assessment/Plan:   1. OSA (obstructive sleep apnea) Intensive lifestyle modifications are the first line treatment for this issue. We discussed several lifestyle modifications today and she will continue to work on diet, exercise and weight loss efforts. We will continue to monitor. Orders and follow up as documented in patient record.   Counseling  Sleep apnea is a condition in which breathing pauses or becomes shallow during sleep. This happens over  and over during the night. This disrupts your sleep and keeps your body from getting the rest that it needs, which can cause tiredness and lack of energy (fatigue) during the day.  Sleep apnea treatment: If you were given a device to open your airway while you sleep, USE IT!  Sleep hygiene:   Limit or avoid alcohol, caffeinated beverages, and cigarettes, especially close to bedtime.   Do not eat a large meal or eat spicy foods right before bedtime. This can lead to digestive discomfort that can make it hard for you to sleep.  Keep a sleep diary to help you and your health care provider figure out what could be causing your insomnia.  . Make your bedroom a dark, comfortable place where it is easy to fall asleep. ? Put up shades or blackout curtains to block light from outside. ? Use a white noise machine to block noise. ? Keep the temperature cool. . Limit screen use before bedtime. This includes: ? Watching TV. ? Using your smartphone, tablet, or computer. . Stick to a routine that includes going to bed and waking up at the same times every day and night. This can help you fall asleep faster. Consider making a quiet activity, such as reading, part of your nighttime routine. . Try to avoid taking naps during the day so that you sleep better at night. . Get out of bed if you are still awake after 15 minutes of trying to sleep. Keep the lights down, but try reading or doing a  quiet activity. When you feel sleepy, go back to bed.  2. Essential hypertension Christine Eaton is working on healthy weight loss and exercise to improve blood pressure control. We will watch for signs of hypotension as she continues her lifestyle modifications.  3. Vitamin D deficiency Low Vitamin D level contributes to fatigue and are associated with obesity, breast, and colon cancer. She agrees to continue to take OTC Vitamin D @5 ,000 IU daily and will follow-up for routine testing of Vitamin D, at least 2-3 times per year to  avoid over-replacement.  4. At risk for heart disease Christine Eaton was given approximately 15 minutes of coronary artery disease prevention counseling today. She is 59 y.o. female and has risk factors for heart disease including obesity. We discussed intensive lifestyle modifications today with an emphasis on specific weight loss instructions and strategies.   Repetitive spaced learning was employed today to elicit superior memory formation and behavioral change.  5. Class 2 severe obesity with serious comorbidity and body mass index (BMI) of 37.0 to 37.9 in adult, unspecified obesity type (HCC) Christine Eaton is currently in the action stage of change. As such, her goal is to continue with weight loss efforts. She has agreed to the Category 1 Plan.   Exercise goals: For substantial health benefits, adults should do at least 150 minutes (2 hours and 30 minutes) a week of moderate-intensity, or 75 minutes (1 hour and 15 minutes) a week of vigorous-intensity aerobic physical activity, or an equivalent combination of moderate- and vigorous-intensity aerobic activity. Aerobic activity should be performed in episodes of at least 10 minutes, and preferably, it should be spread throughout the week.  Behavioral modification strategies: increasing lean protein intake, increasing water intake and increasing high fiber foods.  Christine Eaton has agreed to follow-up with our clinic in 2-33 weeks. She was informed of the importance of frequent follow-up visits to maximize her success with intensive lifestyle modifications for her multiple health conditions.   Objective:   Blood pressure 131/81, pulse 80, temperature 98.1 F (36.7 C), temperature source Oral, height 5\' 6"  (1.676 m), weight 230 lb (104.3 kg), SpO2 96 %. Body mass index is 37.12 kg/m.  General: Cooperative, alert, well developed, in no acute distress. HEENT: Conjunctivae and lids unremarkable. Cardiovascular: Regular rhythm.  Lungs: Normal work of  breathing. Neurologic: No focal deficits.   Lab Results  Component Value Date   CREATININE 0.68 07/20/2019   BUN 14 07/20/2019   NA 141 07/20/2019   K 3.6 07/20/2019   CL 100 07/20/2019   CO2 23 07/20/2019   Lab Results  Component Value Date   ALT 19 07/20/2019   AST 21 07/20/2019   ALKPHOS 94 07/20/2019   BILITOT 0.4 07/20/2019   Lab Results  Component Value Date   TSH 0.671 07/20/2019   Lab Results  Component Value Date   CHOL 189 07/20/2019   HDL 65 07/20/2019   LDLCALC 115 (H) 07/20/2019   TRIG 48 07/20/2019   CHOLHDL 2.9 07/20/2019   Lab Results  Component Value Date   WBC 3.3 (L) 07/20/2019   HGB 13.7 07/20/2019   HCT 41.2 07/20/2019   MCV 80 07/20/2019   PLT 351 07/20/2019   Lab Results  Component Value Date   IRON 91 07/20/2019   TIBC 325 07/20/2019   FERRITIN 269 (H) 07/20/2019   Attestation Statements:   Reviewed by clinician on day of visit: allergies, medications, problem list, medical history, surgical history, family history, social history, and previous encounter notes.  I,  Water quality scientist, [place], am acting as transcriptionist for Briscoe Deutscher, DO  I have reviewed the above documentation for accuracy and completeness, and I agree with the above. Briscoe Deutscher, DO

## 2019-10-18 NOTE — Progress Notes (Signed)
  Subjective:  Patient ID: Christine Eaton, female    DOB: 1960-06-04,  MRN: [id number]  Chief Complaint  Patient presents with  . Routine Post Op    POV#1 DOS 5.5.2021 EXC. BENIGN LESION LT, WEDGE EXCISION OF SKIN LT. Pt states no concerns, some tendernes. Denies fever/chills/nausea/vomiting.     DOS: 08/09/19 Procedure: Excision of lesion with wedge nail fold skin excision left  59 y.o. female presents with the above complaint. History confirmed with patient.   Objective:  Physical Exam: tenderness at the surgical site, local edema noted and calf supple, nontender. Incision: healing well, no significant drainage, no dehiscence, no significant erythema   Assessment:   1. Epidermoid cyst   2. Post-operative state     Plan:  Patient was evaluated and treated and all questions answered.  Post-operative State -Dressing applied consisting of sterile gauze, kerlix and ACE bandage -WBAT in Surgical shoe  No follow-ups on file.

## 2019-11-21 ENCOUNTER — Ambulatory Visit (INDEPENDENT_AMBULATORY_CARE_PROVIDER_SITE_OTHER): Payer: No Typology Code available for payment source | Admitting: Podiatry

## 2019-11-21 DIAGNOSIS — Z5329 Procedure and treatment not carried out because of patient's decision for other reasons: Secondary | ICD-10-CM

## 2019-11-21 NOTE — Progress Notes (Signed)
No show for appt. 

## 2019-12-05 ENCOUNTER — Other Ambulatory Visit: Payer: Self-pay

## 2019-12-05 ENCOUNTER — Ambulatory Visit (INDEPENDENT_AMBULATORY_CARE_PROVIDER_SITE_OTHER): Payer: No Typology Code available for payment source | Admitting: Podiatry

## 2019-12-05 DIAGNOSIS — L03032 Cellulitis of left toe: Secondary | ICD-10-CM | POA: Diagnosis not present

## 2019-12-05 DIAGNOSIS — L720 Epidermal cyst: Secondary | ICD-10-CM | POA: Diagnosis not present

## 2019-12-07 NOTE — Progress Notes (Signed)
  Subjective:  Patient ID: Christine Eaton, female    DOB: July 28, 1960,  MRN: [id number]  No chief complaint on file.   59 y.o. female presents for follow up of nail procedure. States that the site of previous cyst is healed and she is very happy about that. Is concerned about the appearance of the nail however as the nail continues to split  Objective:  Physical Exam: Toe healing well no signs of recurrence of the cyst. Transverse fracture of the nail. Assessment:   1. Epidermoid cyst   2. Paronychia of great toe, left    Plan:  Patient was evaluated and treated and all questions answered.  S/p Cyst Excision, right -The surgical site and area of previous cyst appears well healed without recurrence. She has some nail deformity that should resolve with time. Nail gently debrided. -Patient to follow up as needed

## 2019-12-12 ENCOUNTER — Other Ambulatory Visit (HOSPITAL_COMMUNITY): Payer: Self-pay | Admitting: Internal Medicine

## 2019-12-12 MED FILL — CHLORTHALIDONE 25 MG TABS: 25 | 90 days supply | Qty: 90 | Fill #0

## 2019-12-28 ENCOUNTER — Telehealth: Payer: Self-pay | Admitting: Neurology

## 2019-12-28 NOTE — Telephone Encounter (Signed)
Called the patient to push her apt out to a later time to allow more time to become compliant with her machine.

## 2020-01-01 ENCOUNTER — Ambulatory Visit: Payer: Self-pay | Admitting: Neurology

## 2020-01-28 MED FILL — valACYclovir HCL 1 GM TABS: 1 | 90 days supply | Qty: 90 | Fill #1

## 2020-01-29 ENCOUNTER — Other Ambulatory Visit (HOSPITAL_COMMUNITY): Payer: Self-pay | Admitting: Internal Medicine

## 2020-01-29 MED FILL — AMLODIPINE-VALSARTAN 10-320: 10-320 | 90 days supply | Qty: 90 | Fill #0

## 2020-02-05 ENCOUNTER — Other Ambulatory Visit (HOSPITAL_COMMUNITY): Payer: Self-pay | Admitting: Internal Medicine

## 2020-02-05 MED FILL — POTASSIUM CHLORIDE CRYS ER: 10 | 90 days supply | Qty: 90 | Fill #0

## 2020-02-06 ENCOUNTER — Ambulatory Visit: Payer: Self-pay | Admitting: Neurology

## 2020-02-21 ENCOUNTER — Other Ambulatory Visit: Payer: Self-pay | Admitting: Internal Medicine

## 2020-02-21 DIAGNOSIS — Z1231 Encounter for screening mammogram for malignant neoplasm of breast: Secondary | ICD-10-CM

## 2020-04-03 ENCOUNTER — Ambulatory Visit
Admission: RE | Admit: 2020-04-03 | Discharge: 2020-04-03 | Disposition: A | Discharge status: Home / Self Care | Payer: No Typology Code available for payment source | Source: Ambulatory Visit | Attending: Internal Medicine | Admitting: Internal Medicine

## 2020-04-03 ENCOUNTER — Other Ambulatory Visit: Payer: Self-pay

## 2020-04-03 DIAGNOSIS — Z1231 Encounter for screening mammogram for malignant neoplasm of breast: Secondary | ICD-10-CM

## 2020-04-17 ENCOUNTER — Encounter: Payer: Self-pay | Admitting: Neurology

## 2020-04-17 ENCOUNTER — Ambulatory Visit: Payer: No Typology Code available for payment source | Admitting: Neurology

## 2020-04-17 VITALS — BP 102/70 | HR 100 | Ht 66.5 in | Wt 244.0 lb

## 2020-04-17 DIAGNOSIS — G4733 Obstructive sleep apnea (adult) (pediatric): Secondary | ICD-10-CM | POA: Diagnosis not present

## 2020-04-17 DIAGNOSIS — Z7189 Other specified counseling: Secondary | ICD-10-CM | POA: Diagnosis not present

## 2020-04-17 NOTE — Patient Instructions (Addendum)
CPAP and BPAP Information CPAP and BPAP are methods that use air pressure to keep your airways open and to help you breathe well. CPAP and BPAP use different amounts of pressure. Your health care provider will tell you whether CPAP or BPAP would be more helpful for you.  CPAP stands for "continuous positive airway pressure." With CPAP, the amount of pressure stays the same while you breathe in and out.  BPAP stands for "bi-level positive airway pressure." With BPAP, the amount of pressure will be higher when you breathe in (inhale) and lower when you breathe out(exhale). This allows you to take larger breaths. CPAP or BPAP may be used in the hospital, or your health care provider may want you to use it at home. You may need to have a sleep study before your health care provider can order a machine for you to use at home. Why are CPAP and BPAP treatments used? CPAP or BPAP can be helpful if you have:  Sleep apnea.  Chronic obstructive pulmonary disease (COPD).  Heart failure.  Medical conditions that cause muscle weakness, including muscular dystrophy or amyotrophic lateral sclerosis (ALS).  Other problems that cause breathing to be shallow, weak, abnormal, or difficult. CPAP and BPAP are most commonly used for obstructive sleep apnea (OSA) to keep the airways from collapsing when the muscles relax during sleep. How is CPAP or BPAP administered? Both CPAP and BPAP are provided by a small machine with a flexible plastic tube that attaches to a plastic mask that you wear. Air is blown through the mask into your nose or mouth. The amount of pressure that is used to blow the air can be adjusted on the machine. Your health care provider will set the pressure setting and help you find the best mask for you. When should CPAP or BPAP be used? In most cases, the mask only needs to be worn during sleep. Generally, the mask needs to be worn throughout the night and during any daytime naps. People with  certain medical conditions may also need to wear the mask at other times when they are awake. Follow instructions from your health care provider about when to use the machine. What are some tips for using the mask?  Because the mask needs to be snug, some people feel trapped or closed-in (claustrophobic) when first using the mask. If you feel this way, you may need to get used to the mask. One way to do this is to hold the mask loosely over your nose or mouth and then gradually apply the mask more snugly. You can also gradually increase the amount of time that you use the mask.  Masks are available in various types and sizes. If your mask does not fit well, talk with your health care provider about getting a different one. Some common types of masks include: ? Full face masks, which fit over the mouth and nose. ? Nasal masks, which fit over the nose. ? Nasal pillow or prong masks, which fit into the nostrils.  If you are using a mask that fits over your nose and you tend to breathe through your mouth, a chin strap may be applied to help keep your mouth closed.  Some CPAP and BPAP machines have alarms that may sound if the mask comes off or develops a leak.  If you have trouble with the mask, it is very important that you talk with your health care provider about finding a way to make the mask easier to   tolerate. Do not stop using the mask. There could be a negative impact to your health if you stop using the mask.   What are some tips for using the machine?  Place your CPAP or BPAP machine on a secure table or stand near an electrical outlet.  Know where the on/off switch is on the machine.  Follow instructions from your health care provider about how to set the pressure on your machine and when you should use it.  Do not eat or drink while the CPAP or BPAP machine is on. Food or fluids could get pushed into your lungs by the pressure of the CPAP or BPAP.  For home use, CPAP and BPAP  machines can be rented or purchased through home health care companies. Many different brands of machines are available. Renting a machine before purchasing may help you find out which particular machine works well for you. Your insurance may also decide which machine you may get.  Keep the CPAP or BPAP machine and attachments clean. Ask your health care provider for specific instructions. Follow these instructions at home:  Do not use any products that contain nicotine or tobacco, such as cigarettes, e-cigarettes, and chewing tobacco. If you need help quitting, ask your health care provider.  Keep all follow-up visits as told by your health care provider. This is important. Contact a health care provider if:  You have redness or pressure sores on your head, face, mouth, or nose from the mask or head gear.  You have trouble using the CPAP or BPAP machine.  You cannot tolerate wearing the CPAP or BPAP mask.  Someone tells you that you snore even when wearing your CPAP or BPAP. Get help right away if:  You have trouble breathing.  You feel confused. Summary  CPAP and BPAP are methods that use air pressure to keep your airways open and to help you breathe well.  You may need to have a sleep study before your health care provider can order a machine for home use.  If you have trouble with the mask, it is very important that you talk with your health care provider about finding a way to make the mask easier to tolerate. Do not stop using the mask. There could be a negative impact to your health if you stop using the mask.  Follow instructions from your health care provider about when to use the machine. This information is not intended to replace advice given to you by your health care provider. Make sure you discuss any questions you have with your health care provider. Document Revised: 04/14/2019 Document Reviewed: 04/17/2019 Elsevier Patient Education  2021 [place]: CPAP HYGIENE PROPER UPKEEP OF YOUR CPAP MACHINE CAN HELP ENSURE THE DEVICE FUNCTIONS PROPERLY CPAP CLEANING INSTRUCTIONS Along with proper CPAP cleaning it is recommended that you replace your mask, tubing and filters once very 3 months and more frequently if you are sick.   DAILY CLEANING Do not use moisturizing soaps, bleach, scented oils, chlorine, or alcohol-based solutions to clean your supplies. These solutions may cause irritation to your skin and lungs and may reduce the life of your products. [employer] or Comparable works best for daily cleaning.  **If you've been sick, it's smart to wash your mask, tubing, humidifier and filter daily until your cold, flu or virus symptoms are gone. That can help reduce the amount of time you spend under the weather.  Before using your mask -wash  your face daily with soap and water to remove excess facial oils. Wipe down your mask (including areas that come in contact with your skin) using a damp towel with soap and warm water. This will remove any oils, dead skin cells, and sweat on the mask that can affect the quality of the seal. Gently rinse with a clean towel and let the mask air-dry out of direct sunlight. You can also use unscented baby wipes or pre-moistened towels designed specifically for cleaning CPAP masks, which are available on-line. DO NOT USE CLOROX OR DISINFECTING WIPES. If your unit has a humidifier, empty any leftover water instead of letting in sit in the unit all day. Refill the humidifier with clean, distilled water right before bedtime for optimal use WEEKLY (OR MORE FREQUENT) CLEANING Your mask and tubing need a full bath at least once a week to keep it free of dust, bacteria, and germs. (During COVID-19 or any other flu/virus we recommend more frequent cleaning) Clean the CPAP tubing, nasal mask, and headgear in a bathroom sink filled with warm water and a few drops of ammonia-free, mild dish detergent. Avoid using  stronger cleaning products, as they may damage the mask or leave harmful residue. Swirl all parts around for about five minutes, rinse well and let air dry during the day. Hang the tubing over the shower rod, on a towel rack or in the laundry room to ensure all the water drips out. The mask and headgear can be air-dried on a towel or hung on a hook or hanger. You should also wipe down your CPAP machine with a damp cloth. Ensure the unit is unplugged. The towel shouldn't be too damp or wet, as water could get into the machine. Clean the filter by removing it and rinsing it in warm tap water. Run it under the water and squeeze to make sure there is no dust. Then blot down the filter with a towel. Do not wash your machine's white filter, if one is present-those are disposable and should be replaced every two weeks. If you are recovering from being sick, we recommend changing the filter sooner. If your CPAP has a humidifier, that also needs to be cleaned weekly. Empty any remaining water and then wash the water chamber in the sink with warm soapy water. Rinse well and drain out as much of the water as possible. Let the chamber air-dry before placing it back into the CPAP unit. Every other week you should disinfect the humidifier. Do that by soaking it in a solution of one-part vinegar to five parts water for 30 minutes, thoroughly rinsing and then placing in your dishwasher's top rack for washing. And keep it clean by using only distilled water to prevent mineral deposits that can build up and cause damage to your machine. IMPORTANT TIPS Make caring for your CPAP equipment part of your morning routine. Keep machine and accessories out of direct sunlight to avoid damaging them. Never use bleach to clean accessories. Place machine on a level surface and away from curtains that may interfere with the air intake. Keep track of when you should order replacement parts for your mask and accessories so that you  always get the most out of your CPAP. You can also sign up for Auto Supply by contacting our DME department at [email or web address] **The following are examples of soap that may be used: [employer], [place] soap (plain).  With a little upkeep, your CPAP can continue to help you  breathe better for a long time. Just a few minutes a day can help keep your CPAP running efficiently for years to come.  If you have a CPAP, but are struggling with compliance, check out our no mask oral appliance, for those with mild to moderate sleep apnea.  Call and schedule a consultation with one of our sleep medicine physicians, or ask your doctor about a sleep referral to the [facility].

## 2020-04-17 NOTE — Progress Notes (Signed)
SLEEP MEDICINE CLINIC    Provider:  Larey Seat, MD  Primary Care Physician:     Referring Provider: Dr. Briscoe Deutscher, DO.         Chief Complaint according to patient   Patient presents with:    . New Patient (Initial Visit)     RV after Sleep study-       HISTORY OF PRESENT ILLNESS:  Christine Eaton is a 60  Year- old  African American female patient here in a RV after Sleep study and trial of CPAP therapy -  on 04/17/2020 , originally referred by Dr Briscoe Deutscher, DO.   HST June 2021 : Overall AHI is indicative of severe sleep apnea at 60/h and was not REM sleep dependent. The AHI is highest in supine at 64.7/h. Intermittent sleep hypoxia and bradycardia were noted.  Recommendations:    This degree of apnea can be treated with CPAP/BiPAP or with an Inspire device (BMI need to reach 32 or less for Inspire).  The patient will also benefit from avoiding supine sleep and from weight loss.  I will proceed to order an autotitration CPAP device for a setting from 6-18 cm water, 2 cm EPR and heated humidity with a mask of patient's choice.  Christine Eaton reports that she received the machine and GI 2021 but only used it for about 4 nights.  She was uncomfortable visit but could not get any feedback from the medical equipment company.  She had been referred to [facility] on [address].  She also has not taken the machine apart to properly clean it and got no guidance on how to put it back together.  The machine was set up for the range of pressure that I have prescribed 6 through 18 cmH2O was 2 cm EPR and she used it for a total of 21 hours in 4 days her residual AHI was 3.1/h down from 60/h so it seems that the machine worked but she was not comfortable with the results.  She had moderate air leakage the 95th percentile pressure she required was 12.6 cm of water. She had reported the air-leakage to the DME and could not get help.  She still owns the machine and she brought  it with her, I will use today's visit to try a nasal pillow mask with her and have her try  A nasal pillow.       Chief concern according to patient : " I have been told that I snore "  I have the pleasure of seeing Christine Eaton today, a right -handed African American female with a possible sleep disorder.  She reports being a snorer but not waking up from snoring or choking. She  has a past medical history of Anxiety, Back pain, Depression, Hypertension, Infertility, female, Knee pain, Mass of right hand (09/2015), and Neck pain.Marland Kitchen Recently she has developed nocturia times 2 , after increase in fluid intake.    Sleep relevant medical history: no ENT surgery, no trauma.    Family medical /sleep history: no other known family member on CPAP with OSA, with insomnia, or any sleep walkers.    Social history: Patient is working as a Charity fundraiser at [employer]- and lives in a household alone. The patient currently works in daytime. Tobacco use- remote use 2017 ; ETOH use : socially,  Caffeine intake in form of Coffee( none ) Soda(none ) Tea ( 2-3 / week) , nor energy drinks. Regular  exercise: none .  Hobbies : gardening , floristics. No pets in the home.     Sleep habits are as follows: The patient's dinner time is between 6- 7 PM.  The patient goes to bed at 11 PM and continues to sleep for 4-7 hours, wakes for 1-2 bathroom breaks. The preferred sleep position is on her left side , with the support of 2 pillows.  Dreams are reportedly rare.  6.30 AM is the usual rise time. The patient wakes up with an alarm, set for 6.15 AM .  She reports  feeling refreshed and restored in AM,  Naps are never , ever taken.    Review of Systems: Out of a complete 14 system review, the patient complains of only the following symptoms, and all other reviewed systems are negative.:  , snoring- reportedly, feels sleepy after lunch.    How likely are you to doze in the following situations: 0 = not likely, 1  = slight chance, 2 = moderate chance, 3 = high chance   Sitting and Reading? Watching Television? Sitting inactive in a public place (theater or meeting)? As a passenger in a car for an hour without a break? Lying down in the afternoon when circumstances permit? Sitting and talking to someone? Sitting quietly after lunch without alcohol? In a car, while stopped for a few minutes in traffic?   Total = 6/ 24 points   Anxiety and depression has been hightened during the pandemic.   She received both shots of pfizer. COVID 19 vaccine.   Social History   Socioeconomic History  . Marital status: Widowed    Spouse name: Not on file  . Number of children: Not on file  . Years of education: Not on file  . Highest education level: Not on file  Occupational History  . Occupation: phlebotomist  Tobacco Use  . Smoking status: Never Smoker  . Smokeless tobacco: Never Used  Substance and Sexual Activity  . Alcohol use: Yes    Comment: occasionally  . Drug use: No  . Sexual activity: Not Currently    Birth control/protection: Surgical  Other Topics Concern  . Not on file  Social History Narrative  . Not on file   Social Determinants of Health   Financial Resource Strain: Not on file  Food Insecurity: Not on file  Transportation Needs: Not on file  Physical Activity: Not on file  Stress: Not on file  Social Connections: Not on file    Family History  Problem Relation Age of Onset  . Hypertension Mother   . Cancer Mother        OVARIAN  . Obesity Mother   . Diabetes Father   . Cancer Father        LUNG  . Hypertension Father   . Obesity Father   . Hypertension Maternal Aunt   . Hypertension Maternal Uncle   . Hypertension Maternal Aunt   . Colon cancer Neg Hx   . Breast cancer Neg Hx     Past Medical History:  Diagnosis Date  . Anxiety   . Back pain   . Depression   . Hypertension    states under control with meds., has been on med. > 15 yr.  . Infertility,  female   . Knee pain   . Mass of right hand 09/2015  . Neck pain     Past Surgical History:  Procedure Laterality Date  . BREAST EXCISIONAL BIOPSY Right 2005  . BREAST MASS  EXCISION Right 08/23/2003  . CHROMOPERTUBATION  04/15/2001  . COLONOSCOPY  06/25/2011   Procedure: COLONOSCOPY;  Surgeon: Garlan Fair, MD;  Location: [facility];  Service: Endoscopy;  Laterality: N/A;  MAC  . EXCISION OF ENDOMETRIOMA Right 04/15/2001   right tube  . INGUINAL HERNIA REPAIR Left   . KNEE ARTHROSCOPY  02/02/2012   Procedure: ARTHROSCOPY KNEE;  Surgeon: Lorn Junes, MD;  Location: [facility];  Service: Orthopedics;  Laterality: Left;  ARTHROSCOPY KNEE WITH DEBRIDEMENT/SHAVING (CHONDROPLASTY)  . LAPAROSCOPIC LYSIS OF ADHESIONS  04/15/2001; 05/24/2007  . MASS EXCISION Right 10/02/2015   Procedure: EXCISION OF RIGHT HAND  MASS;  Surgeon: Charlotte Crumb, MD;  Location: [facility];  Service: Orthopedics;  Laterality: Right;  . REPAIR VAGINAL CUFF  06/18/2007  . ROBOTIC ASSISTED TOTAL HYSTERECTOMY WITH BILATERAL SALPINGO OOPHERECTOMY  05/24/2007     Current Outpatient Medications on File Prior to Visit  Medication Sig Dispense Refill  . amLODipine-valsartan (EXFORGE) 10-320 MG per tablet Take 1 tablet by mouth daily.    . Ascorbic Acid (VITAMIN C) 500 MG CAPS Take 1 capsule by mouth daily.    . chlorthalidone (HYGROTON) 25 MG tablet Take 25 mg by mouth daily.    . Cholecalciferol (VITAMIN D-3) 125 MCG (5000 UT) TABS Take 1 tablet by mouth daily.    . Multiple Vitamin (MULTIVITAMIN) tablet Take 1 tablet by mouth daily.    . potassium chloride (K-DUR,KLOR-CON) 10 MEQ tablet Take 10 mEq by mouth daily.   3  . valACYclovir (VALTREX) 1000 MG tablet valacyclovir 1 gram tablet  Take 1 tablet every day by oral route.     No current facility-administered medications on file prior to visit.    Allergies  Allergen Reactions  . Lavender Oil Hives  . Citalopram Hydrobromide  Other (See Comments)    Pt stated, "weight gain"  . Lisinopril Cough    Physical exam:  Today's Vitals   04/17/20 1543  BP: 102/70  Pulse: 100  Weight: 244 lb (110.7 kg)  Height: 5' 6.5" (1.689 m)   Body mass index is 38.79 kg/m.   Wt Readings from Last 3 Encounters:  04/17/20 244 lb (110.7 kg)  10/16/19 230 lb (104.3 kg)  09/20/19 233 lb (105.7 kg)     Ht Readings from Last 3 Encounters:  04/17/20 5' 6.5" (1.689 m)  10/16/19 _0  (1.676 m)  09/20/19 _1  (1.676 m)      General: The patient is awake, alert and appears not in acute distress. The patient is well groomed. Head: Normocephalic, atraumatic. Neck is supple. Mallampati 3,  neck circumference:15. 25  inches . Nasal airflow patent.   Retrognathia is seen.  Dental status: intact  Cardiovascular:  Regular rate and cardiac rhythm by pulse,  without distended neck veins. Respiratory: Lungs are clear to auscultation.  Skin:  Without evidence of ankle edema, or rash. Trunk: The patient's posture is erect.   Neurologic exam : The patient is awake and alert, oriented to place and time.   Memory subjective described as intact.  Attention span & concentration ability appears normal.  Speech is fluent,  without  dysarthria, dysphonia or aphasia.  Mood and affect are appropriate.   Cranial nerves: no loss of smell or taste reported  Pupils are equal in shape and size- round and  and briskly reactive to light. Funduscopic exam deferred.-   Extraocular movements in vertical and horizontal planes were intact and without nystagmus. No Diplopia. Visual fields  by finger perimetry are intact. Hearing was intact to finger rubbing.    Facial sensation intact to fine touch.  Facial motor strength is symmetric and tongue and uvula move midline.  Neck ROM : rotation, tilt and flexion extension were normal for age and shoulder shrug was symmetrical.         The patient was fitted in a seated position for a medium to large  size ESON  Mask by [employer] and [employer].  And also will try a nasal cradle by [employer] in medium. .    After spending a total time of 20 minutes face to face and additional time for physical and neurologic examination, review of laboratory studies,  personal review of imaging studies, reports and results of other testing and review of referral information / records as far as provided in visit, I have established the following assessments:  1)  Severe OSA will best respond to CPAP therapy but the patient was unfortunately not given the support by DME -   she was never offered an alternative to the full face mask she grew to dislike quickly.  I hipe we can still turn the tide and get her to use these 2 smaller, easier interfaces .  I like for Christine Eaton to call our office in one month , I will be able to look at the new compliance data.    in the meantime I will instruct [facility] to stop sending her unwanted supplies.   She can change her mind and go with dental device therapy if needed at any time.    I would like to thank Dr Juleen China  for allowing me to meet with and to take care of this pleasant patient.  ICC: I will share my notes with PCP   Electronically signed by: Larey Seat, MD 04/17/2020 3:54 PM  [facility] and [employer] Board certified by The [employer] of Sleep Medicine and Diplomate of the [employer] of Sleep Medicine. Board certified In Neurology through the [place], Fellow of the [employer] of Neurology. Medical Director of [employer].

## 2020-04-18 MED FILL — CHLORTHALIDONE 25 MG TABS: 25 | 90 days supply | Qty: 90 | Fill #0

## 2020-06-01 MED FILL — valACYclovir HCL 1 GM TABS: 1 | 90 days supply | Qty: 90 | Fill #2

## 2020-06-01 MED FILL — POTASSIUM CHLORIDE CRYS ER: 10 | 90 days supply | Qty: 90 | Fill #1

## 2020-06-05 MED FILL — valACYclovir HCL 1 GM TABS: 1 | 90 days supply | Qty: 90 | Fill #0

## 2020-06-05 MED FILL — POTASSIUM CHLORIDE CRYS ER: 10 | 90 days supply | Qty: 90 | Fill #0

## 2020-06-05 MED FILL — AMLODIPINE-VALSARTAN 10-320: 10-320 | 90 days supply | Qty: 90 | Fill #0

## 2020-06-14 ENCOUNTER — Encounter: Payer: Self-pay | Admitting: Podiatry

## 2020-06-14 ENCOUNTER — Ambulatory Visit (INDEPENDENT_AMBULATORY_CARE_PROVIDER_SITE_OTHER): Payer: No Typology Code available for payment source | Admitting: Podiatry

## 2020-06-14 ENCOUNTER — Other Ambulatory Visit: Payer: Self-pay

## 2020-06-14 DIAGNOSIS — L600 Ingrowing nail: Secondary | ICD-10-CM | POA: Diagnosis not present

## 2020-06-14 NOTE — Patient Instructions (Signed)

## 2020-06-14 NOTE — Progress Notes (Signed)
  Subjective:  Patient ID: Christine Eaton, female    DOB: June 12, 1960,  MRN: [id number]  Chief Complaint  Patient presents with  . Toe Pain    Hallux right - lateral border, tender x few weeks, sometimes pain on top of the nail with pressure   60 y.o. female presents with the above complaint. History confirmed with patient.   Objective:  Physical Exam: warm, good capillary refill, no trophic changes or ulcerative lesions, normal DP and PT pulses and normal sensory exam.  Painful ingrowing nail at lateral border of the right, hallux; without warmth, erythema or drainage  Assessment:   1. Ingrown nail    Plan:  Patient was evaluated and treated and all questions answered.  Ingrown Nail, right -Patient elects to proceed with ingrown toenail removal today -Ingrown nail excised. See procedure note. -Educated on post-procedure care including soaking. Written instructions provided.  Procedure: Excision of Ingrown Toenail Location: Right 1st toe lateral border Anesthesia: Lidocaine 1% plain; 1.19mL and Marcaine 0.5% plain; 1.38mL, digital block. Skin Prep: Betadine. Dressing: Silvadene; telfa; dry, sterile, compression dressing. Technique: Following skin prep, the toe was exsanguinated and a tourniquet was secured at the base of the toe. The affected nail border was freed, split with a nail splitter, and excised. Chemical matrixectomy was then performed with phenol and irrigated out with alcohol. The tourniquet was then removed and sterile dressing applied. Disposition: Patient tolerated procedure well. Patient to return in 2 weeks for follow-up.  No follow-ups on file.

## 2020-07-31 ENCOUNTER — Other Ambulatory Visit (HOSPITAL_COMMUNITY): Payer: Self-pay

## 2020-07-31 MED FILL — Chlorthalidone Tab 25 MG: ORAL | 90 days supply | Qty: 90 | Fill #0 | Status: AC

## 2020-09-26 ENCOUNTER — Other Ambulatory Visit (HOSPITAL_COMMUNITY): Payer: Self-pay

## 2020-09-26 MED ORDER — VALACYCLOVIR HCL 1 G PO TABS
1000.0000 mg | ORAL_TABLET | Freq: Every day | ORAL | 3 refills | Status: DC
  Filled 2020-09-26 – 2020-10-11 (×2): qty 90, 90d supply, fill #0
  Filled 2021-02-24: qty 90, 90d supply, fill #1
  Filled 2021-06-27: qty 90, 90d supply, fill #2
  Filled 2021-09-22: qty 90, 90d supply, fill #3

## 2020-09-28 ENCOUNTER — Ambulatory Visit (INDEPENDENT_AMBULATORY_CARE_PROVIDER_SITE_OTHER): Payer: No Typology Code available for payment source | Admitting: Podiatry

## 2020-09-28 DIAGNOSIS — L600 Ingrowing nail: Secondary | ICD-10-CM

## 2020-09-28 NOTE — Progress Notes (Signed)
  Subjective:  Patient ID: Christine Eaton, female    DOB: 1960-09-05,  MRN: [id number]  Chief Complaint  Patient presents with   Ingrown Toenail    Left great medial border has been sore since Monday   60 y.o. female presents with the above complaint. History confirmed with patient.   Objective:  Physical Exam: warm, good capillary refill, no trophic changes or ulcerative lesions, normal DP and PT pulses and normal sensory exam. POP medial tubercle hallux without evidence of recurrent ingrown nail nor cyst.  Assessment:   1. Ingrown nail    Plan:  Patient was evaluated and treated and all questions answered.  ?Ingrown Nail, left -Does not appear to be regrowth of nail nor cyst. Likely bone prominence. Dispense toe cap for proection. F/u should issues persist.   Return if symptoms worsen or fail to improve.

## 2020-10-01 ENCOUNTER — Other Ambulatory Visit (HOSPITAL_COMMUNITY): Payer: Self-pay

## 2020-10-01 MED FILL — Amlodipine Besylate-Valsartan Tab 10-320 MG: ORAL | 90 days supply | Qty: 90 | Fill #0 | Status: CN

## 2020-10-01 MED FILL — Potassium Chloride Microencapsulated Crys ER Tab 10 mEq: ORAL | 90 days supply | Qty: 90 | Fill #0 | Status: CN

## 2020-10-01 MED FILL — Chlorthalidone Tab 25 MG: ORAL | 90 days supply | Qty: 90 | Fill #1 | Status: CN

## 2020-10-02 ENCOUNTER — Other Ambulatory Visit (HOSPITAL_COMMUNITY): Payer: Self-pay

## 2020-10-08 ENCOUNTER — Other Ambulatory Visit (HOSPITAL_COMMUNITY): Payer: Self-pay

## 2020-10-10 ENCOUNTER — Other Ambulatory Visit (HOSPITAL_COMMUNITY): Payer: Self-pay

## 2020-10-11 ENCOUNTER — Other Ambulatory Visit (HOSPITAL_COMMUNITY): Payer: Self-pay

## 2020-10-11 MED FILL — Amlodipine Besylate-Valsartan Tab 10-320 MG: ORAL | 90 days supply | Qty: 90 | Fill #0 | Status: AC

## 2020-10-11 MED FILL — Chlorthalidone Tab 25 MG: ORAL | 90 days supply | Qty: 90 | Fill #1 | Status: AC

## 2020-10-11 MED FILL — Potassium Chloride Microencapsulated Crys ER Tab 10 mEq: ORAL | 90 days supply | Qty: 90 | Fill #0 | Status: AC

## 2020-10-14 ENCOUNTER — Ambulatory Visit: Payer: No Typology Code available for payment source | Admitting: Family Medicine

## 2020-10-15 ENCOUNTER — Ambulatory Visit: Payer: No Typology Code available for payment source | Admitting: Neurology

## 2020-10-23 IMAGING — MG DIGITAL SCREENING BILATERAL MAMMOGRAM WITH TOMO AND CAD
4 series · 4 of 12 positions shown · non-contrast
Comparison: Previous exam(s).

CLINICAL DATA: Screening.

EXAM:
DIGITAL SCREENING BILATERAL MAMMOGRAM WITH TOMO AND CAD

[R CC synth-2D]
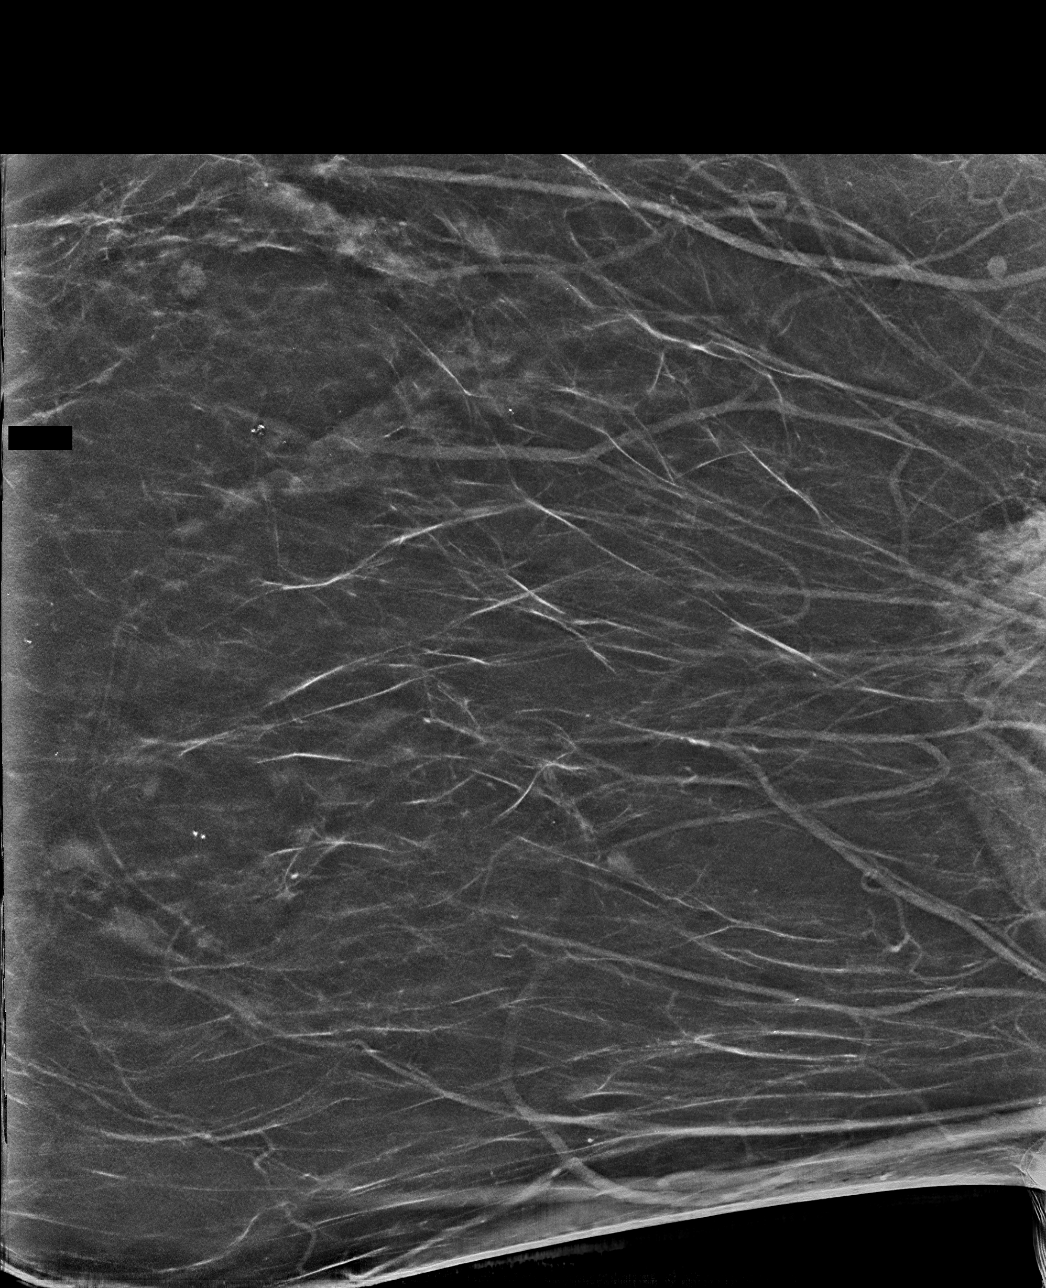

[L CC synth-2D]
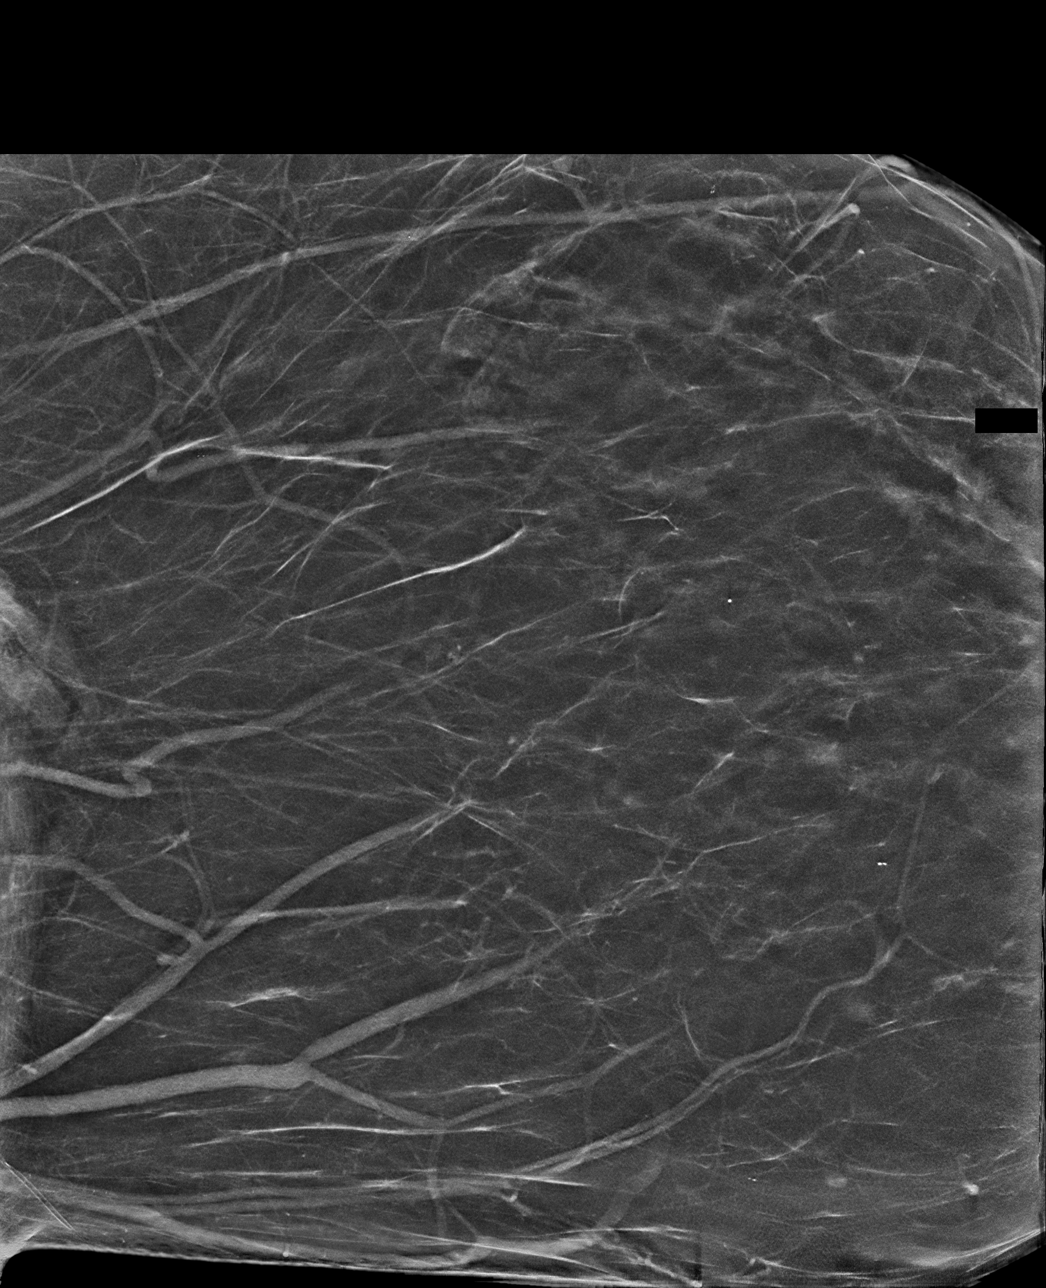

[L MLO tomo · tomo slice 47/93.0]
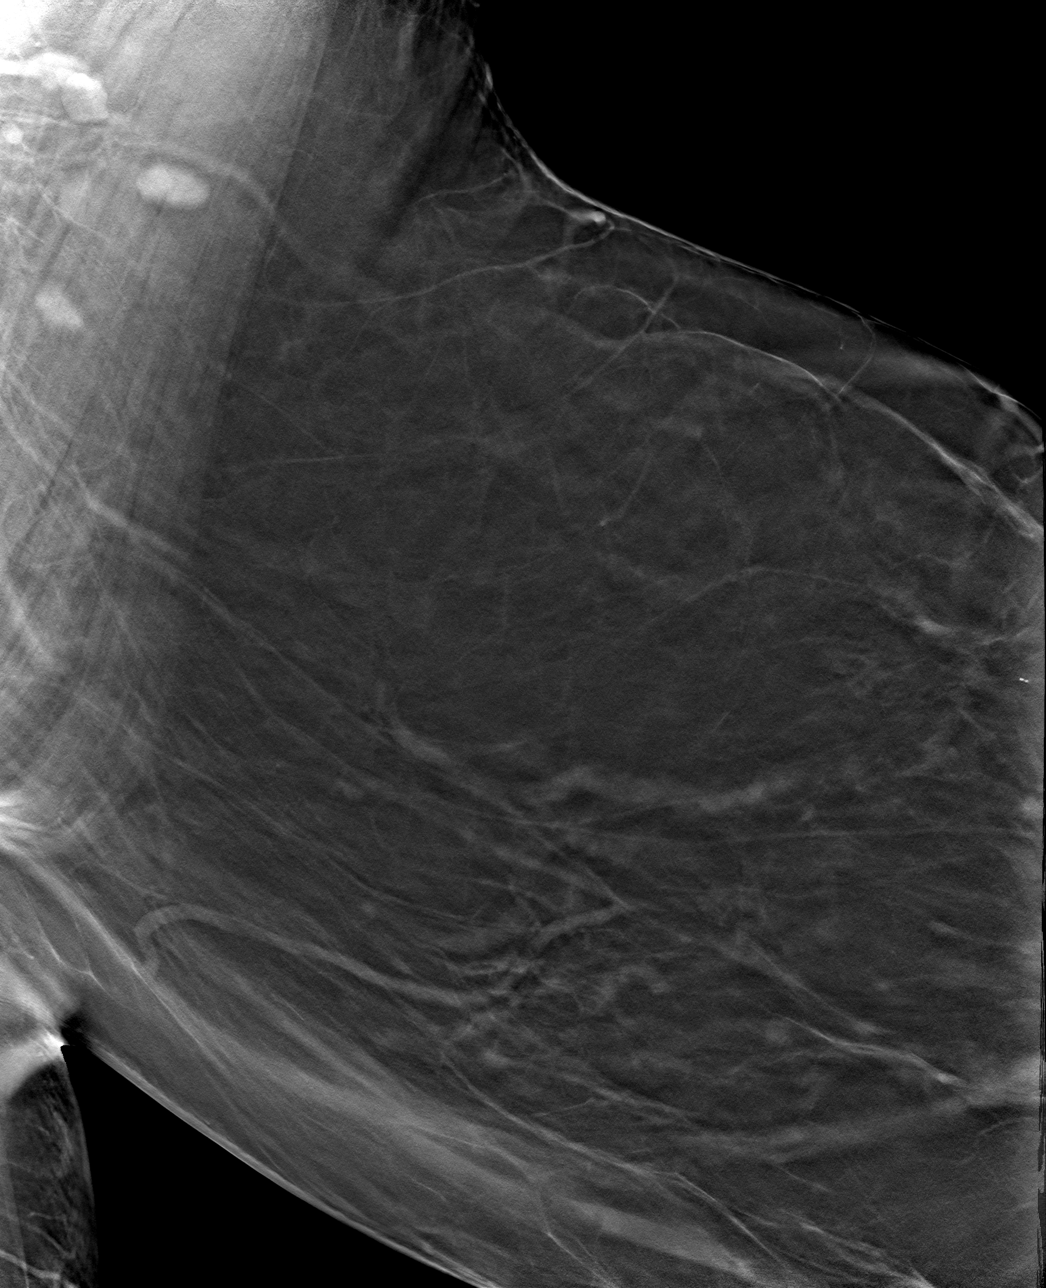

[L CC tomo · tomo slice 35/70.0]
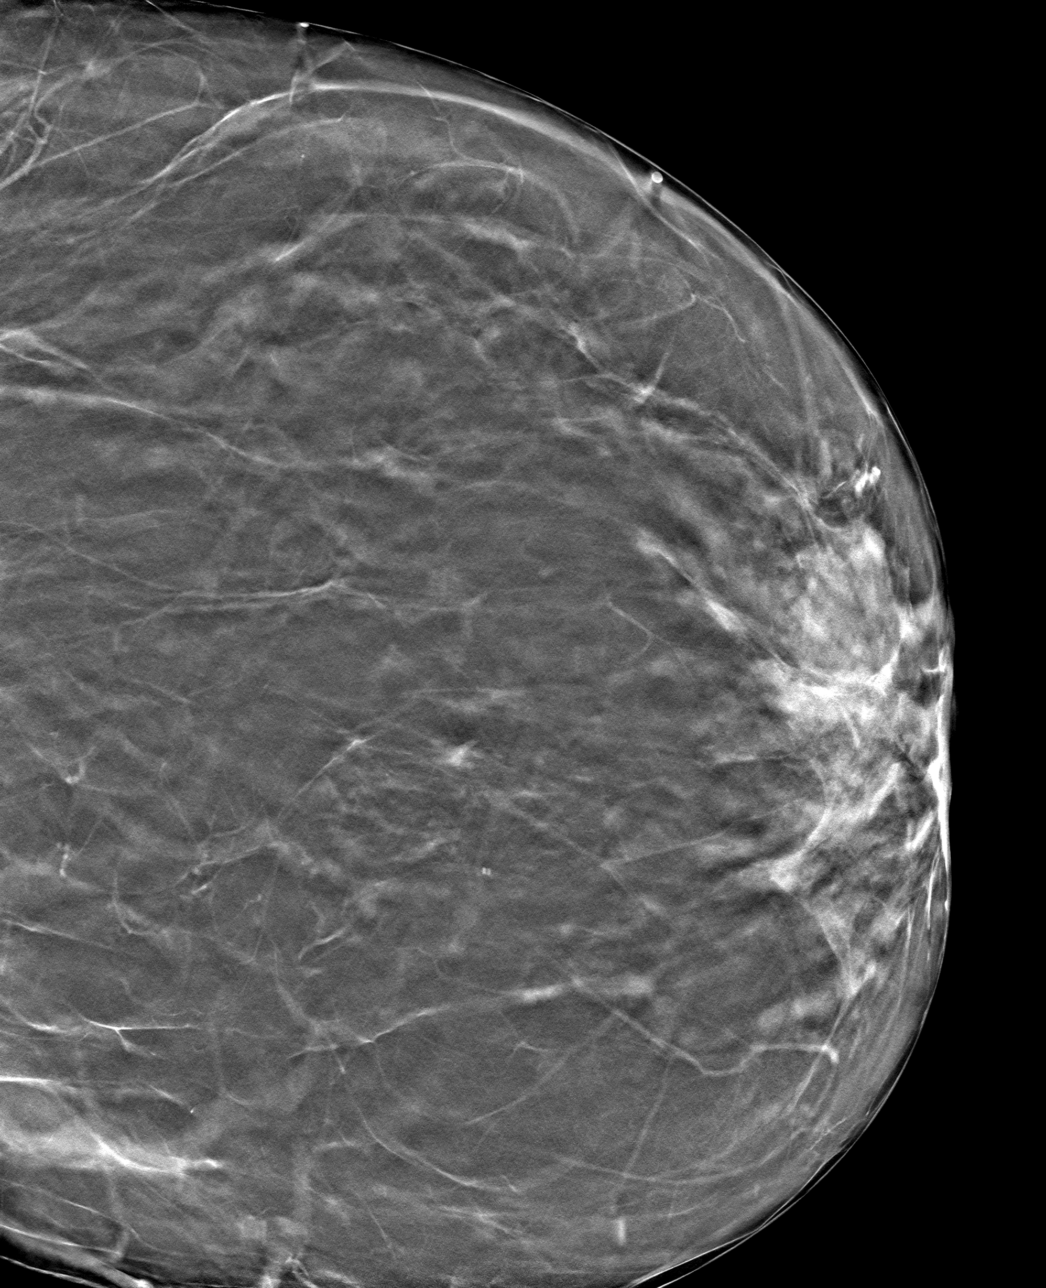

[4 of 12 positions shown; findings below may reference images not displayed]

ACR Breast Density Category b: There are scattered areas of
fibroglandular density.
FINDINGS: There are no findings suspicious for malignancy. Images were
processed with CAD.
IMPRESSION: No mammographic evidence of malignancy. A result letter of this
screening mammogram will be mailed directly to the patient.

RECOMMENDATION:
Screening mammogram in one year. (Code:[id number])

BI-RADS CATEGORY  1: Negative.

## 2020-11-04 NOTE — Progress Notes (Signed)
PATIENT: Christine Eaton DOB: 03-24-1961  REASON FOR VISIT: follow up HISTORY FROM: patient  Chief Complaint  Patient presents with   Follow-up    Pt alone, rm 12. She is still using machine. DME. [employer] ([facility])      HISTORY OF PRESENT ILLNESS: 11/05/20 ALL:  Christine Eaton is a 60 y.o. female here today for follow up for OSA on CPAP.  She was last seen by Dr Brett Fairy 04/2020 and having difficulty with compliance due to have anxiety with mask. She was advised to try nasal pillow. Since, she feels that she is doing much better. She does not feel as anxious with the nasal pillow. She admits that she does not use her CPAP every night. Most nights she will go to sleep with it on but then pull it off during the night. She reports that DME uses her home phone and personal email to contact her. She does always respond to these calls. 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      HISTORY: (copied from Dr Dohemier's previous note)  Christine Eaton is a 60  Year- old  African American female patient here in a RV after Sleep study and trial of CPAP therapy -  on 04/17/2020 originally referred by Dr Briscoe Deutscher, DO.   HST June 2021 : Overall AHI is indicative of severe sleep apnea at 60/h and was not REM sleep dependent. The AHI is highest in supine at 64.7/h. Intermittent sleep hypoxia and bradycardia were noted. Recommendations:                  This degree of apnea can be treated with CPAP/BiPAP or with an Inspire device (BMI need to reach 32 or less for Inspire). The patient will also benefit from avoiding supine sleep and from weight loss. I will proceed to order an autotitration CPAP device for a setting from 6-18 cm water, 2 cm EPR and heated humidity with a mask of patient's choice.   Mrs. Overley reports that she received the machine and GI 2021 but only used it for about 4 nights.  She was uncomfortable visit but could not get any feedback from the medical equipment company.   She had been referred to [facility] on [place].  She also has not taken the machine apart to properly clean it and got no guidance on how to put it back together.  The machine was set up for the range of pressure that I have prescribed 6 through 18 cmH2O was 2 cm EPR and she used it for a total of 21 hours in 4 days her residual AHI was 3.1/h down from 60/h so it seems that the machine worked but she was not comfortable with the results.  She had moderate air leakage the 95th percentile pressure she required was 12.6 cm of water. She had reported the air-leakage to the DME and could not get help.   She still owns the machine and she brought it with her, I will use today's visit to try a nasal pillow mask with her and have her try  A nasal pillow.   REVIEW OF SYSTEMS: Out of a complete 14 system review of symptoms, the patient complains only of the following symptoms, none and all other reviewed systems are negative.  ESS: 7   ALLERGIES: Allergies  Allergen Reactions   Lavender Oil Hives   Citalopram Hydrobromide Other (See Comments)    Pt stated, "weight gain"  Lisinopril Cough    HOME MEDICATIONS: Outpatient Medications Prior to Visit  Medication Sig Dispense Refill   amLODipine-valsartan (EXFORGE) 10-320 MG tablet TAKE 1 TABLET BY MOUTH ONCE DAILY 90 tablet 2   Ascorbic Acid (VITAMIN C) 500 MG CAPS Take 1 capsule by mouth daily.     chlorthalidone (HYGROTON) 25 MG tablet TAKE 1 TABLET BY MOUTH EVERY MORNING 90 tablet 2   Cholecalciferol (VITAMIN D-3) 125 MCG (5000 UT) TABS Take 1 tablet by mouth daily.     Multiple Vitamin (MULTIVITAMIN) tablet Take 1 tablet by mouth daily.     potassium chloride (KLOR-CON) 10 MEQ tablet Take 1 tablet (10 mEq total) by mouth every morning. 90 tablet 2   valACYclovir (VALTREX) 1000 MG tablet valacyclovir 1 gram tablet  Take 1 tablet every day by oral route.     valACYclovir (VALTREX) 1000 MG tablet Take 1 tablet (1,000 mg total) by mouth  daily. 90 tablet 3   amLODipine-valsartan (EXFORGE) 10-320 MG per tablet Take 1 tablet by mouth daily.     chlorthalidone (HYGROTON) 25 MG tablet Take 25 mg by mouth daily.     potassium chloride (K-DUR,KLOR-CON) 10 MEQ tablet Take 10 mEq by mouth daily.   3   No facility-administered medications prior to visit.    PAST MEDICAL HISTORY: Past Medical History:  Diagnosis Date   Anxiety    Back pain    Depression    Hypertension    states under control with meds., has been on med. > 15 yr.   Infertility, female    Knee pain    Mass of right hand 09/2015   Neck pain     PAST SURGICAL HISTORY: Past Surgical History:  Procedure Laterality Date   BREAST EXCISIONAL BIOPSY Right 2005   BREAST MASS EXCISION Right 08/23/2003   CHROMOPERTUBATION  04/15/2001   COLONOSCOPY  06/25/2011   Procedure: COLONOSCOPY;  Surgeon: Garlan Fair, MD;  Location: [facility];  Service: Endoscopy;  Laterality: N/A;  MAC   EXCISION OF ENDOMETRIOMA Right 04/15/2001   right tube   INGUINAL HERNIA REPAIR Left    KNEE ARTHROSCOPY  02/02/2012   Procedure: ARTHROSCOPY KNEE;  Surgeon: Lorn Junes, MD;  Location: [place];  Service: Orthopedics;  Laterality: Left;  ARTHROSCOPY KNEE WITH DEBRIDEMENT/SHAVING (CHONDROPLASTY)   LAPAROSCOPIC LYSIS OF ADHESIONS  04/15/2001; 05/24/2007   MASS EXCISION Right 10/02/2015   Procedure: EXCISION OF RIGHT HAND  MASS;  Surgeon: Charlotte Crumb, MD;  Location: [place];  Service: Orthopedics;  Laterality: Right;   REPAIR VAGINAL CUFF  06/18/2007   ROBOTIC ASSISTED TOTAL HYSTERECTOMY WITH BILATERAL SALPINGO OOPHERECTOMY  05/24/2007    FAMILY HISTORY: Family History  Problem Relation Age of Onset   Hypertension Mother    Cancer Mother        OVARIAN   Obesity Mother    Diabetes Father    Cancer Father        LUNG   Hypertension Father    Obesity Father    Hypertension Maternal Aunt    Hypertension Maternal Uncle    Hypertension  Maternal Aunt    Colon cancer Neg Hx    Breast cancer Neg Hx     SOCIAL HISTORY: Social History   Socioeconomic History   Marital status: Widowed    Spouse name: Not on file   Number of children: Not on file   Years of education: Not on file   Highest education level: Not on file  Occupational History   Occupation: phlebotomist  Tobacco Use   Smoking status: Never   Smokeless tobacco: Never  Substance and Sexual Activity   Alcohol use: Yes    Comment: occasionally   Drug use: No   Sexual activity: Not Currently    Birth control/protection: Surgical  Other Topics Concern   Not on file  Social History Narrative   Not on file   Social Determinants of Health   Financial Resource Strain: Not on file  Food Insecurity: Not on file  Transportation Needs: Not on file  Physical Activity: Not on file  Stress: Not on file  Social Connections: Not on file  Intimate Partner Violence: Not on file     PHYSICAL EXAM  Vitals:   11/05/20 1011  BP: 127/81  Pulse: (!) 59  Weight: 238 lb (108 kg)  Height: 5' 6.5" (1.689 m)   Body mass index is 37.84 kg/m.  Generalized: Well developed, in no acute distress  Cardiology: normal rate and rhythm, no murmur noted Respiratory: clear to auscultation bilaterally  Neurological examination  Mentation: Alert oriented to time, place, history taking. Follows all commands speech and language fluent Cranial nerve II-XII: Pupils were equal round reactive to light. Extraocular movements were full, visual field were full  Motor: The motor testing reveals 5 over 5 strength of all 4 extremities. Good symmetric motor tone is noted throughout.  Gait and station: Gait is normal.    DIAGNOSTIC DATA (LABS, IMAGING, TESTING) - I reviewed patient records, labs, notes, testing and imaging myself where available.  No flowsheet data found.   Lab Results  Component Value Date   WBC 3.3 (L) 07/20/2019   HGB 13.7 07/20/2019   HCT 41.2 07/20/2019    MCV 80 07/20/2019   PLT 351 07/20/2019      Component Value Date/Time   NA 141 07/20/2019 1007   K 3.6 07/20/2019 1007   CL 100 07/20/2019 1007   CO2 23 07/20/2019 1007   GLUCOSE 106 (H) 07/20/2019 1007   GLUCOSE 97 10/02/2015 0812   BUN 14 07/20/2019 1007   CREATININE 0.68 07/20/2019 1007   CALCIUM 9.8 07/20/2019 1007   PROT 7.1 07/20/2019 1007   ALBUMIN 4.4 07/20/2019 1007   AST 21 07/20/2019 1007   ALT 19 07/20/2019 1007   ALKPHOS 94 07/20/2019 1007   BILITOT 0.4 07/20/2019 1007   GFRNONAA 97 07/20/2019 1007   GFRAA 112 07/20/2019 1007   Lab Results  Component Value Date   CHOL 189 07/20/2019   HDL 65 07/20/2019   LDLCALC 115 (H) 07/20/2019   TRIG 48 07/20/2019   CHOLHDL 2.9 07/20/2019   No results found for: HGBA1C Lab Results  Component Value Date   VITAMINB12 479 07/20/2019   Lab Results  Component Value Date   TSH 0.671 07/20/2019     ASSESSMENT AND PLAN 60 y.o. year old female  has a past medical history of Anxiety, Back pain, Depression, Hypertension, Infertility, female, Knee pain, Mass of right hand (09/2015), and Neck pain. here with     ICD-10-CM   1. Severe obstructive sleep apnea-hypopnea syndrome  G47.33     2. OSA on CPAP  G47.33    Z99.89         Christine Eaton is doing better on CPAP therapy. Compliance report reveals sub optimal daily and 4 hour use. She admits that she does not always put her mask on and when she does she will remove it during the night. I have  encouraged her to consider using an alarm on her phone to set a bedtime in case she falls asleep on the couch. She was encouraged to continue using CPAP nightly and for greater than 4 hours each night. We will update supply orders as indicated. Risks of untreated sleep apnea review and education materials provided. Healthy lifestyle habits encouraged. She will follow up in 6 months, sooner if needed. She verbalizes understanding and agreement with this plan.    No orders of the  defined types were placed in this encounter.    No orders of the defined types were placed in this encounter.     Debbora Presto, FNP-C 11/05/2020, 10:22 AM [facility] [address], [place], [place] [address] [phone]

## 2020-11-04 NOTE — Patient Instructions (Addendum)
Please continue using your CPAP regularly. While your insurance requires that you use CPAP at least 4 hours each night on 70% of the nights, I recommend, that you not skip any nights and use it throughout the night if you can. Getting used to CPAP and staying with the treatment long term does take time and patience and discipline. Untreated obstructive sleep apnea when it is moderate to severe can have an adverse impact on cardiovascular health and raise her risk for heart disease, arrhythmias, hypertension, congestive heart failure, stroke and diabetes. Untreated obstructive sleep apnea causes sleep disruption, nonrestorative sleep, and sleep deprivation. This can have an impact on your day to day functioning and cause daytime sleepiness and impairment of cognitive function, memory loss, mood disturbance, and problems focussing. Using CPAP regularly can improve these symptoms.  Consider using an alarm for nights that you fall asleep on the couch. Reach out to your DME to let them know the best way to contact you. Follow up in 6 months

## 2020-11-05 ENCOUNTER — Encounter: Payer: Self-pay | Admitting: Family Medicine

## 2020-11-05 ENCOUNTER — Ambulatory Visit: Payer: No Typology Code available for payment source | Admitting: Family Medicine

## 2020-11-05 VITALS — BP 127/81 | HR 59 | Ht 66.5 in | Wt 238.0 lb

## 2020-11-05 DIAGNOSIS — Z9989 Dependence on other enabling machines and devices: Secondary | ICD-10-CM

## 2020-11-05 DIAGNOSIS — G4733 Obstructive sleep apnea (adult) (pediatric): Secondary | ICD-10-CM

## 2020-11-14 ENCOUNTER — Telehealth: Payer: Self-pay | Admitting: Family Medicine

## 2020-11-14 NOTE — Telephone Encounter (Signed)
Have contacted the management team through [employer]. This would have to be addressed by the DME company since it has to do with her machine. The company will let me know once they have talked with the patient.

## 2020-11-14 NOTE — Telephone Encounter (Signed)
Pt states her breathing machine has a red film on it, even in the water compartment.  Pt has reached out to DME with no response.  Pt is afraid to use it, she is asking to be called.

## 2020-11-14 NOTE — Telephone Encounter (Signed)
Their manager from the [place] location, [place], will be contacting the patient.

## 2020-12-31 ENCOUNTER — Other Ambulatory Visit (HOSPITAL_COMMUNITY): Payer: Self-pay

## 2020-12-31 MED ORDER — CIPROFLOXACIN HCL 500 MG PO TABS
500.0000 mg | ORAL_TABLET | Freq: Two times a day (BID) | ORAL | 1 refills | Status: DC
  Filled 2020-12-31: qty 2, 1d supply, fill #0
  Filled 2020-12-31: qty 22, 11d supply, fill #0

## 2021-01-24 ENCOUNTER — Other Ambulatory Visit: Payer: Self-pay

## 2021-01-24 ENCOUNTER — Ambulatory Visit: Payer: No Typology Code available for payment source | Attending: Internal Medicine

## 2021-01-24 ENCOUNTER — Other Ambulatory Visit (HOSPITAL_BASED_OUTPATIENT_CLINIC_OR_DEPARTMENT_OTHER): Payer: Self-pay

## 2021-01-24 DIAGNOSIS — Z23 Encounter for immunization: Secondary | ICD-10-CM

## 2021-01-24 MED ORDER — PFIZER COVID-19 VAC BIVALENT 30 MCG/0.3ML IM SUSP
INTRAMUSCULAR | 0 refills | Status: DC
  Filled 2021-01-24: qty 0.3, 1d supply, fill #0

## 2021-01-24 NOTE — Progress Notes (Signed)
   Covid-19 Vaccination Clinic  Name:  Christine Eaton    MRN: [id number] DOB: [birth year]  01/24/2021  Christine Eaton was observed post Covid-19 immunization for 15 minutes without incident. She was provided with Vaccine Information Sheet and instruction to access the V-Safe system.   Christine Eaton was instructed to call 911 with any severe reactions post vaccine: Difficulty breathing  Swelling of face and throat  A fast heartbeat  A bad rash all over body  Dizziness and weakness   Immunizations Administered     Name Date Dose VIS Date Route   Pfizer Covid-19 Vaccine Bivalent Booster 01/24/2021 11:56 AM 0.3 mL 12/04/2020 Intramuscular   Manufacturer: [place]   Lot: [id number]   [place]: [phone]

## 2021-02-24 ENCOUNTER — Other Ambulatory Visit (HOSPITAL_COMMUNITY): Payer: Self-pay

## 2021-02-24 MED ORDER — AMLODIPINE BESYLATE-VALSARTAN 10-320 MG PO TABS
1.0000 | ORAL_TABLET | Freq: Every day | ORAL | 3 refills | Status: DC
  Filled 2021-02-24: qty 90, 90d supply, fill #0
  Filled 2021-06-27: qty 90, 90d supply, fill #1
  Filled 2021-09-22 – 2021-10-01 (×2): qty 90, 90d supply, fill #2

## 2021-02-24 MED ORDER — POTASSIUM CHLORIDE CRYS ER 10 MEQ PO TBCR
10.0000 meq | EXTENDED_RELEASE_TABLET | Freq: Every day | ORAL | 3 refills | Status: DC
  Filled 2021-02-24: qty 90, 90d supply, fill #0
  Filled 2021-06-22: qty 90, 90d supply, fill #1
  Filled 2021-09-22 – 2021-10-01 (×2): qty 90, 90d supply, fill #2
  Filled 2022-02-22: qty 90, 90d supply, fill #3

## 2021-02-25 ENCOUNTER — Other Ambulatory Visit (HOSPITAL_COMMUNITY): Payer: Self-pay

## 2021-02-25 MED ORDER — CHLORTHALIDONE 25 MG PO TABS
25.0000 mg | ORAL_TABLET | Freq: Every morning | ORAL | 3 refills | Status: DC
  Filled 2021-02-25: qty 90, 90d supply, fill #0
  Filled 2021-09-22 – 2021-10-01 (×2): qty 90, 90d supply, fill #1
  Filled 2022-01-25: qty 90, 90d supply, fill #2

## 2021-02-26 ENCOUNTER — Other Ambulatory Visit (HOSPITAL_COMMUNITY): Payer: Self-pay

## 2021-02-28 ENCOUNTER — Other Ambulatory Visit (HOSPITAL_COMMUNITY): Payer: Self-pay

## 2021-04-06 HISTORY — PX: COLONOSCOPY: SHX174

## 2021-04-28 ENCOUNTER — Observation Stay (HOSPITAL_COMMUNITY)
Admission: EM | Admit: 2021-04-28 | Discharge: 2021-04-29 | Disposition: A | Discharge status: Home / Self Care | Payer: No Typology Code available for payment source | Attending: Internal Medicine | Admitting: Internal Medicine

## 2021-04-28 ENCOUNTER — Other Ambulatory Visit: Payer: Self-pay

## 2021-04-28 ENCOUNTER — Emergency Department (HOSPITAL_COMMUNITY): Payer: No Typology Code available for payment source

## 2021-04-28 DIAGNOSIS — R0602 Shortness of breath: Secondary | ICD-10-CM | POA: Insufficient documentation

## 2021-04-28 DIAGNOSIS — R42 Dizziness and giddiness: Secondary | ICD-10-CM | POA: Insufficient documentation

## 2021-04-28 DIAGNOSIS — I10 Essential (primary) hypertension: Secondary | ICD-10-CM | POA: Diagnosis present

## 2021-04-28 DIAGNOSIS — E876 Hypokalemia: Secondary | ICD-10-CM | POA: Diagnosis present

## 2021-04-28 DIAGNOSIS — R7303 Prediabetes: Secondary | ICD-10-CM

## 2021-04-28 DIAGNOSIS — R61 Generalized hyperhidrosis: Secondary | ICD-10-CM | POA: Insufficient documentation

## 2021-04-28 DIAGNOSIS — Z20822 Contact with and (suspected) exposure to covid-19: Secondary | ICD-10-CM | POA: Diagnosis not present

## 2021-04-28 DIAGNOSIS — R079 Chest pain, unspecified: Secondary | ICD-10-CM

## 2021-04-28 DIAGNOSIS — R0789 Other chest pain: Principal | ICD-10-CM

## 2021-04-28 DIAGNOSIS — R778 Other specified abnormalities of plasma proteins: Secondary | ICD-10-CM | POA: Insufficient documentation

## 2021-04-28 DIAGNOSIS — Z79899 Other long term (current) drug therapy: Secondary | ICD-10-CM | POA: Insufficient documentation

## 2021-04-28 DIAGNOSIS — R059 Cough, unspecified: Secondary | ICD-10-CM | POA: Diagnosis not present

## 2021-04-28 DIAGNOSIS — G4733 Obstructive sleep apnea (adult) (pediatric): Secondary | ICD-10-CM | POA: Diagnosis present

## 2021-04-28 HISTORY — DX: Chest pain, unspecified: R07.9

## 2021-04-28 HISTORY — DX: Prediabetes: R73.03

## 2021-04-28 LAB — CBC WITH DIFFERENTIAL/PLATELET
Abs Immature Granulocytes: 0.02 10*3/uL (ref 0.00–0.07)
Basophils Absolute: 0 10*3/uL (ref 0.0–0.1)
Basophils Relative: 0 %
Eosinophils Absolute: 0.2 10*3/uL (ref 0.0–0.5)
Eosinophils Relative: 3 %
HCT: 40.8 % (ref 36.0–46.0)
Hemoglobin: 13.2 g/dL (ref 12.0–15.0)
Immature Granulocytes: 0 %
Lymphocytes Relative: 13 %
Lymphs Abs: 0.7 10*3/uL (ref 0.7–4.0)
MCH: 26.1 pg (ref 26.0–34.0)
MCHC: 32.4 g/dL (ref 30.0–36.0)
MCV: 80.8 fL (ref 80.0–100.0)
Monocytes Absolute: 0.5 10*3/uL (ref 0.1–1.0)
Monocytes Relative: 9 %
Neutro Abs: 3.9 10*3/uL (ref 1.7–7.7)
Neutrophils Relative %: 75 %
Platelets: 283 10*3/uL (ref 150–400)
RBC: 5.05 MIL/uL (ref 3.87–5.11)
RDW: 14.6 % (ref 11.5–15.5)
WBC: 5.2 10*3/uL (ref 4.0–10.5)
nRBC: 0 % (ref 0.0–0.2)

## 2021-04-28 LAB — TROPONIN I (HIGH SENSITIVITY)
Troponin I (High Sensitivity): 8 ng/L (ref <18)
Troponin I (High Sensitivity): 22 ng/L — ABNORMAL HIGH (ref <18)
Troponin I (High Sensitivity): 25 ng/L — ABNORMAL HIGH (ref <18)

## 2021-04-28 LAB — COMPREHENSIVE METABOLIC PANEL
ALT: 24 U/L (ref 0–44)
AST: 25 U/L (ref 15–41)
Albumin: 3.2 g/dL — ABNORMAL LOW (ref 3.5–5.0)
Alkaline Phosphatase: 80 U/L (ref 38–126)
Anion gap: 11 (ref 5–15)
BUN: 11 mg/dL (ref 6–20)
CO2: 25 mmol/L (ref 22–32)
Calcium: 9 mg/dL (ref 8.9–10.3)
Chloride: 102 mmol/L (ref 98–111)
Creatinine, Ser: 0.71 mg/dL (ref 0.44–1.00)
GFR, Estimated: >60 mL/min (ref >60)
Glucose, Bld: 138 mg/dL — ABNORMAL HIGH (ref 70–99)
Potassium: 3.2 mmol/L — ABNORMAL LOW (ref 3.5–5.1)
Sodium: 138 mmol/L (ref 135–145)
Total Bilirubin: 0.6 mg/dL (ref 0.3–1.2)
Total Protein: 6.6 g/dL (ref 6.5–8.1)

## 2021-04-28 LAB — RESP PANEL BY RT-PCR (FLU A&B, COVID) ARPGX2
Influenza A by PCR: NEGATIVE
Influenza B by PCR: NEGATIVE
SARS Coronavirus 2 by RT PCR: NEGATIVE

## 2021-04-28 LAB — BRAIN NATRIURETIC PEPTIDE: B Natriuretic Peptide: 61.4 pg/mL (ref 0.0–100.0)

## 2021-04-28 LAB — HIV ANTIBODY (ROUTINE TESTING W REFLEX): HIV Screen 4th Generation wRfx: NONREACTIVE

## 2021-04-28 LAB — D-DIMER, QUANTITATIVE: D-Dimer, Quant: 0.55 ug/mL-FEU — ABNORMAL HIGH (ref 0.00–0.50)

## 2021-04-28 LAB — MAGNESIUM: Magnesium: 2.1 mg/dL (ref 1.7–2.4)

## 2021-04-28 MED ORDER — NITROGLYCERIN 0.4 MG SL SUBL: 0.4000 mg | SUBLINGUAL_TABLET | SUBLINGUAL | Status: DC | PRN

## 2021-04-28 MED ORDER — POTASSIUM CHLORIDE CRYS ER 20 MEQ PO TBCR
40.0000 meq | EXTENDED_RELEASE_TABLET | Freq: Once | ORAL | Status: AC
  Administered 2021-04-28: 40 meq via ORAL
  Filled 2021-04-28: qty 2

## 2021-04-28 MED ORDER — ASPIRIN EC 81 MG PO TBEC: 81.0000 mg | DELAYED_RELEASE_TABLET | Freq: Every day | ORAL | Status: DC

## 2021-04-28 MED ORDER — AMLODIPINE BESYLATE-VALSARTAN 10-320 MG PO TABS
1.0000 | ORAL_TABLET | Freq: Every day | ORAL | Status: DC
Start: 2021-04-28 — End: 2021-04-28

## 2021-04-28 MED ORDER — CHLORTHALIDONE 25 MG PO TABS
25.0000 mg | ORAL_TABLET | Freq: Every morning | ORAL | Status: DC
  Administered 2021-04-29: 06:00:00 25 mg via ORAL
  Filled 2021-04-28 (×2): qty 1

## 2021-04-28 MED ORDER — POTASSIUM CHLORIDE CRYS ER 10 MEQ PO TBCR: 10.0000 meq | EXTENDED_RELEASE_TABLET | Freq: Every day | ORAL | Status: DC

## 2021-04-28 MED ORDER — HEPARIN BOLUS VIA INFUSION
4000.0000 [IU] | Freq: Once | INTRAVENOUS | Status: AC
  Administered 2021-04-28: 4000 [IU] via INTRAVENOUS
  Filled 2021-04-28: qty 4000

## 2021-04-28 MED ORDER — VALACYCLOVIR HCL 500 MG PO TABS
1000.0000 mg | ORAL_TABLET | Freq: Every day | ORAL | Status: DC
  Administered 2021-04-28: 1000 mg via ORAL
  Filled 2021-04-28 (×2): qty 2

## 2021-04-28 MED ORDER — ACETAMINOPHEN 325 MG PO TABS: 650.0000 mg | ORAL_TABLET | ORAL | Status: DC | PRN

## 2021-04-28 MED ORDER — ALBUTEROL SULFATE HFA 108 (90 BASE) MCG/ACT IN AERS
2.0000 | INHALATION_SPRAY | Freq: Once | RESPIRATORY_TRACT | Status: AC
  Administered 2021-04-28: 2 via RESPIRATORY_TRACT
  Filled 2021-04-28: qty 6.7

## 2021-04-28 MED ORDER — AMLODIPINE BESYLATE 5 MG PO TABS
10.0000 mg | ORAL_TABLET | Freq: Every day | ORAL | Status: DC
  Administered 2021-04-28: 10 mg via ORAL
  Filled 2021-04-28: qty 2

## 2021-04-28 MED ORDER — ONDANSETRON HCL 4 MG/2ML IJ SOLN: 4.0000 mg | Freq: Four times a day (QID) | INTRAMUSCULAR | Status: DC | PRN

## 2021-04-28 MED ORDER — IRBESARTAN 300 MG PO TABS
300.0000 mg | ORAL_TABLET | Freq: Every day | ORAL | Status: DC
  Administered 2021-04-28: 300 mg via ORAL
  Filled 2021-04-28 (×2): qty 1

## 2021-04-28 MED ORDER — ASPIRIN 81 MG PO CHEW
324.0000 mg | CHEWABLE_TABLET | Freq: Once | ORAL | Status: AC
  Administered 2021-04-28: 324 mg via ORAL
  Filled 2021-04-28: qty 4

## 2021-04-28 MED ORDER — HEPARIN (PORCINE) 25000 UT/250ML-% IV SOLN
1250.0000 [IU]/h | INTRAVENOUS | Status: DC
  Administered 2021-04-28: 1100 [IU]/h via INTRAVENOUS
  Filled 2021-04-28: qty 250

## 2021-04-28 MED ORDER — AEROCHAMBER PLUS FLO-VU LARGE MISC
1.0000 | Freq: Once | Status: AC
  Administered 2021-04-28: 1

## 2021-04-28 NOTE — Assessment & Plan Note (Addendum)
Continue exforge and chlorthalidone

## 2021-04-28 NOTE — ED Notes (Signed)
Patient transported to X-ray 

## 2021-04-28 NOTE — Assessment & Plan Note (Signed)
a1c of 6.2 12/2020 Continue diet/weight loss and lifestyle modifications

## 2021-04-28 NOTE — H&P (Signed)
History and Physical    IYARI HAGNER Eaton:[id number] DOB: 19-Feb-1961 DOA: 04/28/2021  PCP: Lavone Orn, MD Consultants:   Patient coming from:  work   Chief Complaint: chest pain/tightness   HPI: Christine Eaton is a 61 y.o. female with medical history significant of HTN, prediabetes, obesity, OSA on cpap who presented to ED with complaints of chest tightness and associated shortness of breath and diaphoresis that started this morning. She was walking to work here at [employer] around [employer] when she went up a flight of stairs and became winded, short of breath. She didn't think anything of it since she just climbed stairs, but as she continued to walk across walkway into building she had substernal chest tightness rated as an 8/10, shortness of breath and felt like she couldn't breath with associated diaphoresis. She sat down and her shortness of breath went away. Chest tightness came down to 3-5. She was then walked over to ER by a colleague. Denies any N/V, no radiation of pain.   She has no known family history of CAD. She has remote hx of tobacco use, no hx of diabetes or HLD, treated for HTN.   She recently went to [place] last week (airplane). Had a sore throat on Friday and allery type symptoms that have seemed to resolve with anti-histamine.   Overall feeling good. Denies any fever/chills, headaches/vision changes, dizziness, palpitations, cough, abdominal pain, N/V/D, leg swelling or pain, dysuria.   ED Course: vitals: afebrile, bp: 152/98, HR: 93, RR: 20, oxygen 97% on RA Pertinent labs: covid/flu, d-dimer .55, troponin 8>22, potassium: 3.2, bnp wnl Cxr no acute findings In ED: given ASA, NG, potassium and albuterol inhaler. Cardiology consulted and [facility] asked to admit.   Review of Systems: As per HPI; otherwise review of systems reviewed and negative.   Ambulatory Status:  Ambulates without assistance  Past Medical History:  Diagnosis Date   Anxiety    Back pain     Depression    Hypertension    states under control with meds., has been on med. > 15 yr.   Infertility, female    Knee pain    Mass of right hand 09/2015   Neck pain     Past Surgical History:  Procedure Laterality Date   BREAST EXCISIONAL BIOPSY Right 2005   BREAST MASS EXCISION Right 08/23/2003   CHROMOPERTUBATION  04/15/2001   COLONOSCOPY  06/25/2011   Procedure: COLONOSCOPY;  Surgeon: Garlan Fair, MD;  Location: [facility];  Service: Endoscopy;  Laterality: N/A;  MAC   EXCISION OF ENDOMETRIOMA Right 04/15/2001   right tube   INGUINAL HERNIA REPAIR Left    KNEE ARTHROSCOPY  02/02/2012   Procedure: ARTHROSCOPY KNEE;  Surgeon: Lorn Junes, MD;  Location: [place];  Service: Orthopedics;  Laterality: Left;  ARTHROSCOPY KNEE WITH DEBRIDEMENT/SHAVING (CHONDROPLASTY)   LAPAROSCOPIC LYSIS OF ADHESIONS  04/15/2001; 05/24/2007   MASS EXCISION Right 10/02/2015   Procedure: EXCISION OF RIGHT HAND  MASS;  Surgeon: Charlotte Crumb, MD;  Location: [place];  Service: Orthopedics;  Laterality: Right;   REPAIR VAGINAL CUFF  06/18/2007   ROBOTIC ASSISTED TOTAL HYSTERECTOMY WITH BILATERAL SALPINGO OOPHERECTOMY  05/24/2007    Social History   Socioeconomic History   Marital status: Widowed    Spouse name: Not on file   Number of children: Not on file   Years of education: Not on file   Highest education level: Not on file  Occupational History  Occupation: phlebotomist  Tobacco Use   Smoking status: Never   Smokeless tobacco: Never  Substance and Sexual Activity   Alcohol use: Yes    Comment: occasionally   Drug use: No   Sexual activity: Not Currently    Birth control/protection: Surgical  Other Topics Concern   Not on file  Social History Narrative   Not on file   Social Determinants of Health   Financial Resource Strain: Not on file  Food Insecurity: Not on file  Transportation Needs: Not on file  Physical Activity: Not on file   Stress: Not on file  Social Connections: Not on file  Intimate Partner Violence: Not on file    Allergies  Allergen Reactions   Lavender Oil Hives   Citalopram Hydrobromide Other (See Comments)    Pt stated, "weight gain"   Lisinopril Cough    Family History  Problem Relation Age of Onset   Hypertension Mother    Cancer Mother        OVARIAN   Obesity Mother    Diabetes Father    Cancer Father        LUNG   Hypertension Father    Obesity Father    Hypertension Maternal Aunt    Hypertension Maternal Uncle    Hypertension Maternal Aunt    Colon cancer Neg Hx    Breast cancer Neg Hx     Prior to Admission medications   Medication Sig Start Date End Date Taking? Authorizing Provider  amLODipine-valsartan (EXFORGE) 10-320 MG tablet TAKE 1 TABLET BY MOUTH ONCE DAILY 01/29/20 01/28/21  Lavone Orn, MD  amLODipine-valsartan (EXFORGE) 10-320 MG tablet Take 1 tablet by mouth daily. 02/24/21     Ascorbic Acid (VITAMIN C) 500 MG CAPS Take 1 capsule by mouth daily.    [provider]  chlorthalidone (HYGROTON) 25 MG tablet TAKE 1 TABLET BY MOUTH EVERY MORNING 12/12/19 01/28/21  Lavone Orn, MD  chlorthalidone (HYGROTON) 25 MG tablet Take 1 tablet (25 mg total) by mouth in the morning with food 02/25/21     Cholecalciferol (VITAMIN D-3) 125 MCG (5000 UT) TABS Take 1 tablet by mouth daily.    [provider]  ciprofloxacin (CIPRO) 500 MG tablet Take 1 tablet (500 mg total) by mouth 2 (two) times daily with food & water for 12 days( sun warning) 12/31/20   Allyn Kenner, MD  COVID-19 mRNA bivalent vaccine, Pfizer, (PFIZER COVID-19 VAC BIVALENT) injection Inject into the muscle. 01/24/21   Carlyle Basques, MD  Multiple Vitamin (MULTIVITAMIN) tablet Take 1 tablet by mouth daily.    [provider]  potassium chloride (KLOR-CON) 10 MEQ tablet Take 1 tablet (10 mEq total) by mouth every morning. 02/05/20   Lavone Orn, MD  potassium chloride (KLOR-CON) 10 MEQ tablet  Take 1 tablet (10 mEq total) by mouth daily with food 02/24/21     valACYclovir (VALTREX) 1000 MG tablet valacyclovir 1 gram tablet  Take 1 tablet every day by oral route.    [provider]  valACYclovir (VALTREX) 1000 MG tablet Take 1 tablet (1,000 mg total) by mouth daily. 09/26/20       Physical Exam: Vitals:   04/28/21 1845 04/28/21 1900 04/28/21 1915 04/28/21 1930  BP: (!) 144/90 (!) 156/90 (!) 147/83 (!) 154/85  Pulse: 64 64 61 67  Resp: 18 (!) 23 (!) 22 (!) 25  Temp:      TempSrc:      SpO2: 98% 99% 99% 98%     General:  Appears calm and comfortable and is in NAD Eyes:  PERRL, EOMI, normal lids, iris ENT:  grossly normal hearing, lips & tongue, mmm; appropriate dentition Neck:  no LAD, masses or thyromegaly; no carotid bruits Cardiovascular:  RRR, no m/r/g. No LE edema.  Respiratory:   CTA bilaterally with no wheezes/rales/rhonchi.  Normal respiratory effort. Abdomen:  soft, NT, ND, NABS Back:   normal alignment, no CVAT Skin:  no rash or induration seen on limited exam Musculoskeletal:  grossly normal tone BUE/BLE, good ROM, no bony abnormality Lower extremity:  No LE edema.  Limited foot exam with no ulcerations.  2+ distal pulses. Psychiatric:  grossly normal mood and affect, speech fluent and appropriate, AOx3 Neurologic:  CN 2-12 grossly intact, moves all extremities in coordinated fashion, sensation intact    Radiological Exams on Admission: Independently reviewed - see discussion in A/P where applicable  DG Chest 2 View  Result Date: 04/28/2021 CLINICAL DATA:  Shortness of breath while walking to work this morning, chest tightness EXAM: CHEST - 2 VIEW COMPARISON:  Chest radiograph 08/21/2003 FINDINGS: The cardiomediastinal silhouette is normal. Linear opacities in the left mid lung likely reflect platelike atelectasis. Otherwise, there is no focal consolidation or pulmonary edema. There is no pleural effusion or pneumothorax There is no acute osseous  abnormality. IMPRESSION: Linear opacity in the left mid lung likely reflects atelectasis. Otherwise, no radiographic evidence of acute cardiopulmonary process. Electronically Signed   By: Valetta Mole M.D.   On: 04/28/2021 10:51    EKG: Independently reviewed.  NSR with rate 66; nonspecific ST changes with no evidence of acute ischemia   Labs on Admission: I have personally reviewed the available labs and imaging studies at the time of the admission.  Pertinent labs:   covid/flu,  d-dimer .55,  troponin 8>22,  potassium: 3.2,  bnp wnl   Assessment/Plan * Chest pain- (present on admission) 61 year old with acute onset substernal chest pain/tightness with associated shortness of breath, diaphoresis with heart score of 4. Cardiac risk factors include HTN, age, obesity.  -observation to telemetry -troponin 8>22 -cardiology consulted recommended heparin gtt, ASA and NG, have not seen yet. F/u on official recs.  -has not received NG and chest tightness better, not resolved -continue to trend troponin to peak -echo ordered -daily ASA -lipid panel for AM  Hypokalemia- (present on admission) repleted x 42mq in ED Continue home replacement Magnesium wnl Telemetry Repeat in AM    Hypertension- (present on admission) Continue exforge and chlorthalidone Prn parameters for elevated bp   Severe obstructive sleep apnea-hypopnea syndrome- (present on admission) Continue cpap at night   Prediabetes a1c of 6.2 12/2020 Continue diet/weight loss and lifestyle modifications        There is no height or weight on file to calculate BMI.   Level of care: Telemetry Cardiac DVT prophylaxis:  heparin gtt  Code Status:  Full - confirmed with patient Family Communication: None present Disposition Plan:  The patient is from: home  Anticipated d/c is to: home   Patient placed in observation as anticipate less than 2 midnight stay. Requires hospitalization for chest pain with concern for  ACS. Requires IV medication and procedures as well as MDM with specialists.    Patient is currently: stable Consults called: cardiology:  Dr. HTerrence Dupont Admission status:  observation    AOrma FlamingMD [facility]   How to contact the [facility] or Consulting provider [place] covering provider during after hours [place] for  this patient?  Check the care team in [facility] and look for a) attending/consulting [facility] provider listed and b) the [facility] team listed Log into [email or web address] and use [place]'s universal password to access. If you do not have the password, please contact the hospital operator. Locate the [facility] provider you are looking for under [facility] and page to a number that you can be directly reached. If you still have difficulty reaching the provider, please page the [facility] (Director on Call) for the Hospitalists listed on amion for assistance.   04/28/2021, 8:05 PM

## 2021-04-28 NOTE — Assessment & Plan Note (Signed)
>>  ASSESSMENT AND PLAN FOR HYPOKALEMIA WRITTEN ON 04/28/2021  4:36 PM BY WOLFE, ALLISON, MD  repleted x in ED Continue home replacement Magnesium wnl Telemetry Repeat in AM

## 2021-04-28 NOTE — Assessment & Plan Note (Signed)
repleted x 62meq in ED Continue home replacement Magnesium wnl Telemetry Repeat in AM

## 2021-04-28 NOTE — Assessment & Plan Note (Signed)
Continue cpap at night  

## 2021-04-28 NOTE — Assessment & Plan Note (Addendum)
61 year old with acute onset substernal chest pain/tightness with associated shortness of breath, diaphoresis with heart score of 4. Cardiac risk factors include HTN, age, obesity.  -observation to telemetry -troponin 8>22 -cardiology consulted recommended heparin gtt, ASA and NG, have not seen yet. F/u on official recs.  -has not received NG and chest tightness better, not resolved -continue to trend troponin to peak -echo ordered -daily ASA -lipid panel for AM

## 2021-04-28 NOTE — ED Triage Notes (Signed)
Pt had acute onset of sob while walking into work here this am. Also had diaphoresis. Improved after a few mins of rest. Nasal congestion, cough, and sneezing since Friday.

## 2021-04-28 NOTE — Progress Notes (Signed)
ANTICOAGULATION CONSULT NOTE - Initial Consult  Pharmacy Consult for IV heparin Indication: chest pain/ACS  Allergies  Allergen Reactions   Lavender Oil Hives   Citalopram Hydrobromide Other (See Comments)    Pt stated, "weight gain"   Lisinopril Cough    Patient Measurements:   Heparin Dosing Weight: 84.58 kg  Vital Signs: Temp: 98.7 F (37.1 C) (01/23 1329) Temp Source: Oral (01/23 1329) BP: 153/85 (01/23 1329) Pulse Rate: 60 (01/23 1329)  Labs: Recent Labs    04/28/21 1008 04/28/21 1400  HGB 13.2  --   HCT 40.8  --   PLT 283  --   CREATININE 0.71  --   TROPONINIHS 8 22*    CrCl cannot be calculated (Unknown ideal weight.).   Medical History: Past Medical History:  Diagnosis Date   Anxiety    Back pain    Depression    Hypertension    states under control with meds., has been on med. > 15 yr.   Infertility, female    Knee pain    Mass of right hand 09/2015   Neck pain    Assessment: 36 YOF presenting with shortness of breath and diaphoresis. No history of anticoagulation prior to admission. Pharmacy to dose IV heparin.   H/H within normal limits, platelets are within normal limits. Renal function appears to be at baseline.    Goal of Therapy:  Heparin level 0.3-0.7 units/ml Monitor platelets by anticoagulation protocol: Yes   Plan:  IV heparin 4000 units bolus x 1 Start IV heparin gtt @ 1100 units/h 6 h heparin level Daily heparin level, CBC Monitor for signs/symptoms of bleeding  Thank you for involving pharmacy in this patient's care.  Elita Quick, PharmD PGY1 Ambulatory Care Pharmacy Resident 04/28/2021 4:08 PM  **Pharmacist phone directory can be found on [email or web address] listed under [place]**

## 2021-04-28 NOTE — ED Provider Triage Note (Signed)
Emergency Medicine Provider Triage Evaluation Note  Christine Eaton , a 61 y.o. female  was evaluated in triage.  Pt complains of shortness of breath.   She had a sudden onset of shortness of breath while walking into work this am.  She reports diaphoresis.  Her symptoms resolved after rest.   She just flew to [place] and came back.  Today was going to be her first day back.   Review of Systems  Positive:  Negative: See above  Physical Exam  BP (!) 152/98    Pulse 93    Temp 98.7 F (37.1 C) (Oral)    Resp 20    SpO2 97%  Gen:   Awake, no distress   Resp:  Normal effort  MSK:   Moves extremities without difficulty  Other:  No wheezes.  RRR.   Medical Decision Making  Medically screening exam initiated at 10:02 AM.  Appropriate orders placed.  CLAIRA JETER was informed that the remainder of the evaluation will be completed by another provider, this initial triage assessment does not replace that evaluation, and the importance of remaining in the ED until their evaluation is complete.  Normal speech.  Given the sudden onset of her symptoms with exertion, combined with her recent travels and ongoing chest pressure increase concerning for ACS/PE.  Ordered trop, dimer,       Lorin Glass, [place] 04/28/21 1011

## 2021-04-28 NOTE — ED Provider Notes (Signed)
[facility] EMERGENCY DEPARTMENT Provider Note   CSN: [id number] Arrival date & time: 04/28/21  8527     History  Chief Complaint  Patient presents with   Shortness of Breath    Christine Eaton is a 61 y.o. female with a past medical history of hypertension who presents today for evaluation of shortness of breath and chest pressure. She states that she was walking into work this morning.  She felt very lightheaded, became diaphoretic and had chest pressure with walking.  She states that she felt very short of breath.  She feels like she has had a cold recently.  She recently went on a trip with a cruise and plane flights. She states that her chest pressure she felt like she was going to pass out while walking.  She was able to badge into the building, and go to the lounge and sit down and after few minutes her chest pain improved.  She denies any history of similar.  She denies any nausea or vomiting during this however notes she did get diaphoretic.  HPI     Home Medications Prior to Admission medications   Medication Sig Start Date End Date Taking? Authorizing Provider  amLODipine-valsartan (EXFORGE) 10-320 MG tablet Take 1 tablet by mouth daily. 02/24/21  Yes   Ascorbic Acid (VITAMIN C) 500 MG CAPS Take 1 capsule by mouth daily.   Yes [provider]  chlorthalidone (HYGROTON) 25 MG tablet Take 1 tablet (25 mg total) by mouth in the morning with food 02/25/21  Yes   Cholecalciferol (VITAMIN D-3) 125 MCG (5000 UT) TABS Take 1 tablet by mouth daily.   Yes [provider]  Multiple Vitamin (MULTIVITAMIN) tablet Take 1 tablet by mouth daily.   Yes [provider]  potassium chloride (KLOR-CON) 10 MEQ tablet Take 1 tablet (10 mEq total) by mouth daily with food 02/24/21  Yes   valACYclovir (VALTREX) 1000 MG tablet Take 1 tablet (1,000 mg total) by mouth daily. 09/26/20  Yes   amLODipine-valsartan (EXFORGE) 10-320 MG tablet TAKE 1 TABLET BY  MOUTH ONCE DAILY 01/29/20 01/28/21  Lavone Orn, MD  chlorthalidone (HYGROTON) 25 MG tablet TAKE 1 TABLET BY MOUTH EVERY MORNING 12/12/19 01/28/21  Lavone Orn, MD  ciprofloxacin (CIPRO) 500 MG tablet Take 1 tablet (500 mg total) by mouth 2 (two) times daily with food & water for 12 days( sun warning) Patient not taking: Reported on 04/28/2021 12/31/20   Allyn Kenner, MD  COVID-19 mRNA bivalent vaccine, Pfizer, (PFIZER COVID-19 [place] BIVALENT) injection Inject into the muscle. 01/24/21   Carlyle Basques, MD  potassium chloride (KLOR-CON) 10 MEQ tablet Take 1 tablet (10 mEq total) by mouth every morning. Patient not taking: Reported on 04/28/2021 02/05/20   Lavone Orn, MD      Allergies    Lavender oil, Citalopram hydrobromide, and Lisinopril    Review of Systems   Review of Systems See above Physical Exam Updated Vital Signs BP (!) 153/85 (BP Location: Right Arm)    Pulse 60    Temp 98.7 F (37.1 C) (Oral)    Resp 15    SpO2 99%  Physical Exam Vitals and nursing note reviewed.  Constitutional:      General: She is not in acute distress.    Appearance: She is not ill-appearing.  HENT:     Head: Normocephalic and atraumatic.  Eyes:     Conjunctiva/sclera: Conjunctivae normal.  Cardiovascular:     Rate and Rhythm: Normal rate and  regular rhythm.  Pulmonary:     Effort: Pulmonary effort is normal. No respiratory distress.     Breath sounds: Normal breath sounds. No decreased breath sounds or wheezing.  Chest:     Chest wall: No tenderness.  Abdominal:     General: There is no distension.     Palpations: Abdomen is soft.  Musculoskeletal:     Cervical back: Normal range of motion and neck supple.     Right lower leg: No tenderness. No edema.     Left lower leg: No tenderness. No edema.     Comments: No obvious acute injury  Skin:    General: Skin is warm and dry.  Neurological:     Mental Status: She is alert.     Comments: Awake and alert, answers all questions appropriately.   Speech is not slurred.  Psychiatric:        Mood and Affect: Mood normal.        Behavior: Behavior normal.    ED Results / Procedures / Treatments   Labs (all labs ordered are listed, but only abnormal results are displayed) Labs Reviewed  D-DIMER, QUANTITATIVE - Abnormal; Notable for the following components:      Result Value   D-Dimer, Quant 0.55 (*)    All other components within normal limits  COMPREHENSIVE METABOLIC PANEL - Abnormal; Notable for the following components:   Potassium 3.2 (*)    Glucose, Bld 138 (*)    Albumin 3.2 (*)    All other components within normal limits  TROPONIN I (HIGH SENSITIVITY) - Abnormal; Notable for the following components:   Troponin I (High Sensitivity) 22 (*)    All other components within normal limits  TROPONIN I (HIGH SENSITIVITY) - Abnormal; Notable for the following components:   Troponin I (High Sensitivity) 25 (*)    All other components within normal limits  RESP PANEL BY RT-PCR (FLU A&B, COVID) ARPGX2  CBC WITH DIFFERENTIAL/PLATELET  MAGNESIUM  BRAIN NATRIURETIC PEPTIDE  HEPARIN LEVEL (UNFRACTIONATED)  HIV ANTIBODY (ROUTINE TESTING W REFLEX)  HEPARIN LEVEL (UNFRACTIONATED)  CBC  LIPID PANEL  BASIC METABOLIC PANEL  TROPONIN I (HIGH SENSITIVITY)    EKG EKG Interpretation  Date/Time:  Monday April 28 2021 10:16:50 EST Ventricular Rate:  66 PR Interval:  152 QRS Duration: 92 QT Interval:  404 QTC Calculation: 423 R Axis:   117 Text Interpretation: Normal sinus rhythm Right axis deviation Abnormal ECG When compared with ECG of 30-Sep-2015 16:41, PREVIOUS ECG IS PRESENT Confirmed by Regan Lemming ([id number]) on 04/28/2021 12:12:53 PM  Radiology DG Chest 2 View  Result Date: 04/28/2021 CLINICAL DATA:  Shortness of breath while walking to work this morning, chest tightness EXAM: CHEST - 2 VIEW COMPARISON:  Chest radiograph 08/21/2003 FINDINGS: The cardiomediastinal silhouette is normal. Linear opacities in the left mid lung  likely reflect platelike atelectasis. Otherwise, there is no focal consolidation or pulmonary edema. There is no pleural effusion or pneumothorax There is no acute osseous abnormality. IMPRESSION: Linear opacity in the left mid lung likely reflects atelectasis. Otherwise, no radiographic evidence of acute cardiopulmonary process. Electronically Signed   By: Valetta Mole M.D.   On: 04/28/2021 10:51    Procedures .Critical Care Performed by: Lorin Glass, PA-C Authorized by: Lorin Glass, PA-C   Critical care provider statement:    Critical care time (minutes):  34   Critical care time was exclusive of:  Separately billable procedures and treating other patients and teaching time  Critical care was time spent personally by me on the following activities:  Development of treatment plan with patient or surrogate, discussions with consultants, evaluation of patient's response to treatment, examination of patient, ordering and review of laboratory studies, ordering and review of radiographic studies, ordering and performing treatments and interventions, pulse oximetry, re-evaluation of patient's condition and review of old charts   Care discussed with: admitting provider   Comments:     Elevated troponin, heparin    Medications Ordered in ED Medications  nitroGLYCERIN (NITROSTAT) SL tablet 0.4 mg (has no administration in time range)  heparin ADULT infusion 100 units/mL (25000 units/262mL) (1,100 Units/hr Intravenous New Bag/Given 04/28/21 1757)  acetaminophen (TYLENOL) tablet 650 mg (has no administration in time range)  ondansetron (ZOFRAN) injection 4 mg (has no administration in time range)  aspirin EC tablet 81 mg (has no administration in time range)  potassium chloride SA (KLOR-CON M) CR tablet 40 mEq (40 mEq Oral Given 04/28/21 1332)  albuterol (VENTOLIN HFA) 108 (90 Base) MCG/ACT inhaler 2 puff (2 puffs Inhalation Given 04/28/21 1332)  AeroChamber Plus Flo-Vu Large MISC 1  each (1 each Other Given 04/28/21 1335)  aspirin chewable tablet 324 mg (324 mg Oral Given 04/28/21 1733)  heparin bolus via infusion 4,000 Units (4,000 Units Intravenous Bolus from Bag 04/28/21 1757)    ED Course/ Medical Decision Making/ A&P Clinical Course as of 04/28/21 1839  Mon Apr 28, 2021  1248 D-Dimer, Quant(!): 0.55 Age adjusted normal.  [EH]  1313 I spoke with Dr. Terrence Dupont on-call for cardiology.  He request that I call him back after the delta troponin. [EH]  1320 I updated patient on plan.  She feels like she was wheezing even and walking to the bathroom and tight.  I have auscultated at least twice now without audible wheezing, however we will try treatment with albuterol to see if that improves her symptoms. [EH]  1512 Troponin I (High Sensitivity)(!): 22 Patient troponin is elevated from 8-22. Aspirin is ordered.  She states that currently her chest pressure is a 5 out of 10 and she did not get significant relief with the albuterol.  Trial of sublingual nitro was ordered.  She has not had any aspirin today.  [EH]  1518 I spoke with Dr. Terrence Dupont, he requests hospitalist admission, he will see the patient in consult.  Request start heparin, aspirin, nitro. [EH]  1518 I spoke with hospitalist who will admit patient.  [EH]  [place] with Agricultural consultant.  Patient is currently in the hallway needs to be in the room for continuous monitoring. She instructed me to tell the nurse to "move some patients around."  This was relayed to patient's primary nurse. [EH]    Clinical Course User Index [EH] Lorin Glass, PA-C                           Medical Decision Making Patient is a 61 year old woman who presents today for evaluation of chest pain. 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  This resolved with rest.  Amount and/or Complexity of Data Reviewed Independent  Historian: friend    Details: Co-worker at bedside External Data Reviewed: notes.    Details: Attempted to review most recent visit from [place]: ordered. Decision-making details documented in ED Course.    Details: Initial troponin is 8,  repeat is 22.  D-dimer is age-adjusted normal at 0.55.  BNP is not significantly elevated, she is slightly hypokalemic which is orally repleted, magnesium is normal.  CBC is unremarkable.  Flu and COVID test is negative Radiology: ordered.    Details: No acute abnormalities ECG/medicine tests: ordered.    Details: No ST elevation Discussion of management or test interpretation with external provider(s): Dr. Terrence Dupont of cardiology  Risk OTC drugs. Prescription drug management. Drug therapy requiring intensive monitoring for toxicity. Decision regarding hospitalization. Risk Details: Heparinization, nitro  Critical Care Total time providing critical care: 30-74 minutes Heart score is 4.   Patient was trialed with albuterol due to her shortness of breath with any ambulation without significant improvement. Her chest pressure remained a 5 out of 10.   Dr. Terrence Dupont requested medicine admit. I spoke with hospitalist Dr. Rogers Blocker who will see the patient for admission.   The patient appears reasonably stabilized for admission considering the current resources, flow, and capabilities available in the ED at this time, and I doubt any other [facility] requiring further screening and/or treatment in the ED prior to admission assuming timely admission and bed placement.  Note: Portions of this report may have been transcribed using voice recognition software. Every effort was made to ensure accuracy; however, inadvertent computerized transcription errors may be present    Final Clinical Impression(s) / ED Diagnoses Final diagnoses:  Chest pain, unspecified type  Elevated troponin    Rx / DC Orders ED Discharge Orders     None         Ollen Gross 04/28/21 1839    Regan Lemming, MD 04/28/21 [phone]

## 2021-04-28 NOTE — Consult Note (Signed)
Reason for Consult chest pain associated with shortness of breath and diaphoresis Referring Physician: family medicine  Christine Eaton is an 61 y.o. female.  KYH:CWCBJSE is 61 year old female with past medical history significant for hypertension,morbid obesity, obstructive sleep apnea on CPAP, prediabetic, came to the ED earlier this morning while coming to work, complaining of retrosternal chest tightness associated with shortness of breath and diaphoresis while walking to the hospital from the parking lot after climbing flight of stairs, which lasted approximately 5 minutes again had similar tightness in the ED EKG done in the ED showed normal sinus rhythm with right axis deviation, no acute ischemic changes are noted.  Patient states she walks almost 1 mile every day without any problems.  Denies such episodes of shortness of breath or chest tightness.  In the past.  States recently traveled to [place] and runny nose and chills and sore throat and was tested for covert and flu which was negative.  Patient denies any fever.  High sensitivity troponin first set was normal.  Second set was minimally elevated. States chest tightness occasionally gets worst with coughing. Past Medical History:  Diagnosis Date   Anxiety    Back pain    Depression    Hypertension    states under control with meds., has been on med. > 15 yr.   Infertility, female    Knee pain    Mass of right hand 09/2015   Neck pain     Past Surgical History:  Procedure Laterality Date   BREAST EXCISIONAL BIOPSY Right 2005   BREAST MASS EXCISION Right 08/23/2003   CHROMOPERTUBATION  04/15/2001   COLONOSCOPY  06/25/2011   Procedure: COLONOSCOPY;  Surgeon: Garlan Fair, MD;  Location: [facility];  Service: Endoscopy;  Laterality: N/A;  MAC   EXCISION OF ENDOMETRIOMA Right 04/15/2001   right tube   INGUINAL HERNIA REPAIR Left    KNEE ARTHROSCOPY  02/02/2012   Procedure: ARTHROSCOPY KNEE;  Surgeon: Lorn Junes, MD;   Location: [facility];  Service: Orthopedics;  Laterality: Left;  ARTHROSCOPY KNEE WITH DEBRIDEMENT/SHAVING (CHONDROPLASTY)   LAPAROSCOPIC LYSIS OF ADHESIONS  04/15/2001; 05/24/2007   MASS EXCISION Right 10/02/2015   Procedure: EXCISION OF RIGHT HAND  MASS;  Surgeon: Charlotte Crumb, MD;  Location: [facility];  Service: Orthopedics;  Laterality: Right;   REPAIR VAGINAL CUFF  06/18/2007   ROBOTIC ASSISTED TOTAL HYSTERECTOMY WITH BILATERAL SALPINGO OOPHERECTOMY  05/24/2007    Family History  Problem Relation Age of Onset   Hypertension Mother    Cancer Mother        OVARIAN   Obesity Mother    Diabetes Father    Cancer Father        LUNG   Hypertension Father    Obesity Father    Hypertension Maternal Aunt    Hypertension Maternal Uncle    Hypertension Maternal Aunt    Colon cancer Neg Hx    Breast cancer Neg Hx     Social History:  reports that she has never smoked. She has never used smokeless tobacco. She reports current alcohol use. She reports that she does not use drugs.  Allergies:  Allergies  Allergen Reactions   Lavender Oil Hives   Citalopram Hydrobromide Other (See Comments)    Pt stated, "weight gain"   Lisinopril Cough    Medications: I have reviewed the patient's current medications.  Results for orders placed or performed during the hospital encounter of 04/28/21 (from the  past 48 hour(s))  Resp Panel by RT-PCR (Flu A&B, Covid) Nasopharyngeal Swab     Status: None   Collection Time: 04/28/21 10:06 AM   Specimen: Nasopharyngeal Swab; Nasopharyngeal(NP) swabs in vial transport medium  Result Value Ref Range   SARS Coronavirus 2 by RT PCR NEGATIVE NEGATIVE    Comment: (NOTE) SARS-CoV-2 target nucleic acids are NOT DETECTED.  The SARS-CoV-2 RNA is generally detectable in upper respiratory specimens during the acute phase of infection. The lowest concentration of SARS-CoV-2 viral copies this assay can detect is 138 copies/mL. A  negative result does not preclude SARS-Cov-2 infection and should not be used as the sole basis for treatment or other patient management decisions. A negative result may occur with  improper specimen collection/handling, submission of specimen other than nasopharyngeal swab, presence of viral mutation(s) within the areas targeted by this assay, and inadequate number of viral copies(<138 copies/mL). A negative result must be combined with clinical observations, patient history, and epidemiological information. The expected result is Negative.  Fact Sheet for Patients:  EntrepreneurPulse.com.au  Fact Sheet for Healthcare Providers:  IncredibleEmployment.be  This test is no t yet approved or cleared by the [place] FDA and  has been authorized for detection and/or diagnosis of SARS-CoV-2 by FDA under an Emergency Use Authorization (EUA). This EUA will remain  in effect (meaning this test can be used) for the duration of the COVID-19 declaration under Section 564(b)(1) of the Act, 21 U.S.C.section 360bbb-3(b)(1), unless the authorization is terminated  or revoked sooner.       Influenza A by PCR NEGATIVE NEGATIVE   Influenza B by PCR NEGATIVE NEGATIVE    Comment: (NOTE) The Xpert Xpress SARS-CoV-2/FLU/RSV plus assay is intended as an aid in the diagnosis of influenza from Nasopharyngeal swab specimens and should not be used as a sole basis for treatment. Nasal washings and aspirates are unacceptable for Xpert Xpress SARS-CoV-2/FLU/RSV testing.  Fact Sheet for Patients: EntrepreneurPulse.com.au  Fact Sheet for Healthcare Providers: IncredibleEmployment.be  This test is not yet approved or cleared by the [place] FDA and has been authorized for detection and/or diagnosis of SARS-CoV-2 by FDA under an Emergency Use Authorization (EUA). This EUA will remain in effect (meaning this test can be used)  for the duration of the COVID-19 declaration under Section 564(b)(1) of the Act, 21 U.S.C. section 360bbb-3(b)(1), unless the authorization is terminated or revoked.  Performed at [facility], [place] [address] [place], [place] [address]   D-dimer, quantitative     Status: Abnormal   Collection Time: 04/28/21 10:08 AM  Result Value Ref Range   D-Dimer, Quant 0.55 (H) 0.00 - 0.50 ug/mL-FEU    Comment: (NOTE) At the manufacturer cut-off value of 0.5 g/mL FEU, this assay has a negative predictive value of 95-100%.This assay is intended for use in conjunction with a clinical pretest probability (PTP) assessment model to exclude pulmonary embolism (PE) and deep venous thrombosis (DVT) in outpatients suspected of PE or DVT. Results should be correlated with clinical presentation. Performed at [facility], [place] [address], [place], [place] [address]   Troponin I (High Sensitivity)     Status: None   Collection Time: 04/28/21 10:08 AM  Result Value Ref Range   Troponin I (High Sensitivity) 8 <18 ng/L    Comment: (NOTE) Elevated high sensitivity troponin I (hsTnI) values and significant  changes across serial measurements may suggest ACS but many other  chronic and acute conditions are known to elevate hsTnI results.  Refer to the "Links" section for chest pain algorithms and additional  guidance. Performed at [facility], [place] [address], [place], [place] [address]   Comprehensive metabolic panel     Status: Abnormal   Collection Time: 04/28/21 10:08 AM  Result Value Ref Range   Sodium 138 135 - 145 mmol/L   Potassium 3.2 (L) 3.5 - 5.1 mmol/L   Chloride 102 98 - 111 mmol/L   CO2 25 22 - 32 mmol/L   Glucose, Bld 138 (H) 70 - 99 mg/dL    Comment: Glucose reference range applies only to samples taken after fasting for at least 8 hours.   BUN 11 6 - 20 mg/dL   Creatinine, Ser 0.71 0.44 - 1.00 mg/dL   Calcium 9.0 8.9 - 10.3 mg/dL   Total Protein 6.6 6.5 - 8.1 g/dL    Albumin 3.2 (L) 3.5 - 5.0 g/dL   AST 25 15 - 41 U/L   ALT 24 0 - 44 U/L   Alkaline Phosphatase 80 38 - 126 U/L   Total Bilirubin 0.6 0.3 - 1.2 mg/dL   GFR, Estimated >60 >60 mL/min    Comment: (NOTE) Calculated using the CKD-EPI Creatinine Equation (2021)    Anion gap 11 5 - 15    Comment: Performed at [facility] [address] [place], [place] [address]  CBC with Differential     Status: None   Collection Time: 04/28/21 10:08 AM  Result Value Ref Range   WBC 5.2 4.0 - 10.5 K/uL   RBC 5.05 3.87 - 5.11 MIL/uL   Hemoglobin 13.2 12.0 - 15.0 g/dL   HCT 40.8 36.0 - 46.0 %   MCV 80.8 80.0 - 100.0 fL   MCH 26.1 26.0 - 34.0 pg   MCHC 32.4 30.0 - 36.0 g/dL   RDW 14.6 11.5 - 15.5 %   Platelets 283 150 - 400 K/uL   nRBC 0.0 0.0 - 0.2 %   Neutrophils Relative % 75 %   Neutro Abs 3.9 1.7 - 7.7 K/uL   Lymphocytes Relative 13 %   Lymphs Abs 0.7 0.7 - 4.0 K/uL   Monocytes Relative 9 %   Monocytes Absolute 0.5 0.1 - 1.0 K/uL   Eosinophils Relative 3 %   Eosinophils Absolute 0.2 0.0 - 0.5 K/uL   Basophils Relative 0 %   Basophils Absolute 0.0 0.0 - 0.1 K/uL   Immature Granulocytes 0 %   Abs Immature Granulocytes 0.02 0.00 - 0.07 K/uL    Comment: Performed at [facility], [address], [place], [place] [address]  Troponin I (High Sensitivity)     Status: Abnormal   Collection Time: 04/28/21  2:00 PM  Result Value Ref Range   Troponin I (High Sensitivity) 22 (H) <18 ng/L    Comment: (NOTE) Elevated high sensitivity troponin I (hsTnI) values and significant  changes across serial measurements may suggest ACS but many other  chronic and acute conditions are known to elevate hsTnI results.  Refer to the "Links" section for chest pain algorithms and additional  guidance. Performed at [facility], [place] [address], [place], [place] [address]   Magnesium     Status: None   Collection Time: 04/28/21  2:00 PM  Result Value Ref Range   Magnesium 2.1 1.7 - 2.4 mg/dL     Comment: Performed at [facility], [place] [address] [place], [place] [address]  Brain natriuretic peptide     Status: None  Collection Time: 04/28/21  2:00 PM  Result Value Ref Range   B Natriuretic Peptide 61.4 0.0 - 100.0 pg/mL    Comment: Performed at [facility], [place] [address], [place], [place] [address]    DG Chest 2 View  Result Date: 04/28/2021 CLINICAL DATA:  Shortness of breath while walking to work this morning, chest tightness EXAM: CHEST - 2 VIEW COMPARISON:  Chest radiograph 08/21/2003 FINDINGS: The cardiomediastinal silhouette is normal. Linear opacities in the left mid lung likely reflect platelike atelectasis. Otherwise, there is no focal consolidation or pulmonary edema. There is no pleural effusion or pneumothorax There is no acute osseous abnormality. IMPRESSION: Linear opacity in the left mid lung likely reflects atelectasis. Otherwise, no radiographic evidence of acute cardiopulmonary process. Electronically Signed   By: Valetta Mole M.D.   On: 04/28/2021 10:51    Review of Systems  Constitutional:  Positive for diaphoresis. Negative for fever.  HENT:  Negative for sore throat.   Respiratory:  Positive for chest tightness and shortness of breath. Negative for wheezing.   Cardiovascular:  Positive for chest pain. Negative for palpitations and leg swelling.  Gastrointestinal:  Negative for abdominal pain.  Genitourinary:  Negative for difficulty urinating.  Neurological:  Negative for dizziness and light-headedness.  Blood pressure (!) 153/85, pulse 60, temperature 98.7 F (37.1 C), temperature source Oral, resp. rate 15, SpO2 99 %. Physical Exam Constitutional:      Appearance: She is well-developed. She is obese.  HENT:     Head: Normocephalic and atraumatic.  Eyes:     Extraocular Movements: Extraocular movements intact.     Pupils: Pupils are equal, round, and reactive to light.  Neck:     Vascular: No JVD.  Cardiovascular:     Rate and  Rhythm: Normal rate and regular rhythm.  Pulmonary:     Effort: Pulmonary effort is normal.     Breath sounds: Normal breath sounds.  Abdominal:     General: Bowel sounds are normal.     Palpations: Abdomen is soft.     Tenderness: There is no abdominal tenderness.  Musculoskeletal:        General: Normal range of motion.     Cervical back: Normal range of motion and neck supple.     Right lower leg: No tenderness. No edema.     Left lower leg: No tenderness. No edema.  Skin:    General: Skin is warm and dry.  Neurological:     General: No focal deficit present.     Mental Status: She is alert and oriented to person, place, and time.    Assessment/Plan: Chest pain with shortness of breath/diaphoresis worrisome for angina, rule out MI. Hypertension. Hypokalemia secondary to medications. Morbid obesity. Obstructive sleep apnea. Plan Check serial enzymes and EKGs. Agree with aspirin, nitrates, beta blocker and statin. Hold IV heparin for now as patient chest pain-free Check lipid panel. Check 2-D echo, check for LV /RVsystolic/diastolic function.  Check for MR, TR Will schedule for nuclear stress test in a.m. Charolette Forward 04/28/2021, 5:29 PM

## 2021-04-29 ENCOUNTER — Other Ambulatory Visit (HOSPITAL_COMMUNITY): Payer: Self-pay

## 2021-04-29 ENCOUNTER — Observation Stay (HOSPITAL_BASED_OUTPATIENT_CLINIC_OR_DEPARTMENT_OTHER): Payer: No Typology Code available for payment source

## 2021-04-29 ENCOUNTER — Encounter (HOSPITAL_COMMUNITY): Payer: Self-pay | Admitting: Family Medicine

## 2021-04-29 ENCOUNTER — Observation Stay (HOSPITAL_COMMUNITY): Payer: No Typology Code available for payment source

## 2021-04-29 DIAGNOSIS — R079 Chest pain, unspecified: Secondary | ICD-10-CM | POA: Diagnosis not present

## 2021-04-29 DIAGNOSIS — R0789 Other chest pain: Secondary | ICD-10-CM | POA: Diagnosis not present

## 2021-04-29 LAB — BASIC METABOLIC PANEL
Anion gap: 10 (ref 5–15)
BUN: 9 mg/dL (ref 6–20)
CO2: 26 mmol/L (ref 22–32)
Calcium: 8.7 mg/dL — ABNORMAL LOW (ref 8.9–10.3)
Chloride: 103 mmol/L (ref 98–111)
Creatinine, Ser: 0.76 mg/dL (ref 0.44–1.00)
GFR, Estimated: >60 mL/min (ref >60)
Glucose, Bld: 118 mg/dL — ABNORMAL HIGH (ref 70–99)
Potassium: 3.7 mmol/L (ref 3.5–5.1)
Sodium: 139 mmol/L (ref 135–145)

## 2021-04-29 LAB — CBC
HCT: 40.9 % (ref 36.0–46.0)
Hemoglobin: 13.2 g/dL (ref 12.0–15.0)
MCH: 26.2 pg (ref 26.0–34.0)
MCHC: 32.3 g/dL (ref 30.0–36.0)
MCV: 81.3 fL (ref 80.0–100.0)
Platelets: 289 10*3/uL (ref 150–400)
RBC: 5.03 MIL/uL (ref 3.87–5.11)
RDW: 14.8 % (ref 11.5–15.5)
WBC: 4.2 10*3/uL (ref 4.0–10.5)
nRBC: 0 % (ref 0.0–0.2)

## 2021-04-29 LAB — ECHOCARDIOGRAM COMPLETE
Area-P 1/2: 3.85 cm2
Calc EF: 63.2 %
S' Lateral: 2.4 cm
Single Plane A2C EF: 60.4 %
Single Plane A4C EF: 63.9 %

## 2021-04-29 LAB — HEPARIN LEVEL (UNFRACTIONATED)
Heparin Unfractionated: 0.54 IU/mL (ref 0.30–0.70)
Heparin Unfractionated: 0.22 IU/mL — ABNORMAL LOW (ref 0.30–0.70)

## 2021-04-29 LAB — LIPID PANEL
Cholesterol: 174 mg/dL (ref 0–200)
HDL: 67 mg/dL (ref >40)
LDL Cholesterol: 94 mg/dL (ref 0–99)
Total CHOL/HDL Ratio: 2.6 RATIO
Triglycerides: 63 mg/dL (ref <150)
VLDL: 13 mg/dL (ref 0–40)

## 2021-04-29 MED ORDER — TECHNETIUM TC 99M TETROFOSMIN IV KIT
10.8000 | PACK | Freq: Once | INTRAVENOUS | Status: AC | PRN
  Administered 2021-04-29: 13:00:00 10.8 via INTRAVENOUS

## 2021-04-29 MED ORDER — TECHNETIUM TC 99M TETROFOSMIN IV KIT
32.1000 | PACK | Freq: Once | INTRAVENOUS | Status: AC | PRN
  Administered 2021-04-29: 14:00:00 32.1 via INTRAVENOUS

## 2021-04-29 MED ORDER — ASPIRIN 81 MG PO TBEC
81.0000 mg | DELAYED_RELEASE_TABLET | Freq: Every day | ORAL | 11 refills | Status: DC
  Filled 2021-04-29: qty 30, 30d supply, fill #0

## 2021-04-29 MED ORDER — REGADENOSON 0.4 MG/5ML IV SOLN
INTRAVENOUS | Status: AC
  Administered 2021-04-29: 14:00:00 0.4 mg
  Filled 2021-04-29: qty 5

## 2021-04-29 MED ORDER — REGADENOSON 0.4 MG/5ML IV SOLN
0.4000 mg | Freq: Once | INTRAVENOUS | Status: DC
  Filled 2021-04-29: qty 5

## 2021-04-29 NOTE — ED Notes (Signed)
Sitting up eating soup. No complaints. No signs of distress.

## 2021-04-29 NOTE — Progress Notes (Signed)
°  Echocardiogram 2D Echocardiogram has been performed.  Christine Eaton M 04/29/2021, 9:53 AM

## 2021-04-29 NOTE — ED Notes (Signed)
No complaints of chest pain or shortness of breath. Vitals stable. Skin w/d/I. No signs of distress.

## 2021-04-29 NOTE — ED Notes (Signed)
Patient transported to nuclear medicine via transport.

## 2021-04-29 NOTE — Progress Notes (Signed)
Subjective:  Doing well denies any chest pain or shortness of breath high-sensitivity troponin minimally elevated .  Events of the ED echo noted.  Patient ate a light breakfast earlier this morning around 6 AM.  Spoke with nuclear medicine should be able to do the stress test later today  Objective:  Vital Signs in the last 24 hours: Temp:  [98.7 F (37.1 C)] 98.7 F (37.1 C) (01/23 1329) Pulse Rate:  [52-70] 62 (01/24 1200) Resp:  [14-25] 22 (01/24 1200) BP: (98-161)/(52-99) 133/79 (01/24 1200) SpO2:  [93 %-100 %] 95 % (01/24 1200) Weight:  [295 kg] 108 kg (01/24 0932)  Intake/Output from previous day: No intake/output data recorded. Intake/Output from this shift: No intake/output data recorded.  Physical Exam: Exam unchanged  Lab Results: Recent Labs    04/28/21 1008 04/29/21 0645  WBC 5.2 4.2  HGB 13.2 13.2  PLT 283 289   Recent Labs    04/28/21 1008 04/29/21 0645  NA 138 139  K 3.2* 3.7  CL 102 103  CO2 25 26  GLUCOSE 138* 118*  BUN 11 9  CREATININE 0.71 0.76   No results for input(s): TROPONINI in the last 72 hours.  Invalid input(s): CK, MB Hepatic Function Panel Recent Labs    04/28/21 1008  PROT 6.6  ALBUMIN 3.2*  AST 25  ALT 24  ALKPHOS 80  BILITOT 0.6   Recent Labs    04/29/21 0645  CHOL 174   No results for input(s): PROTIME in the last 72 hours.  Imaging: DG Chest 2 View  Result Date: 04/28/2021 CLINICAL DATA:  Shortness of breath while walking to work this morning, chest tightness EXAM: CHEST - 2 VIEW COMPARISON:  Chest radiograph 08/21/2003 FINDINGS: The cardiomediastinal silhouette is normal. Linear opacities in the left mid lung likely reflect platelike atelectasis. Otherwise, there is no focal consolidation or pulmonary edema. There is no pleural effusion or pneumothorax There is no acute osseous abnormality. IMPRESSION: Linear opacity in the left mid lung likely reflects atelectasis. Otherwise, no radiographic evidence of acute  cardiopulmonary process. Electronically Signed   By: Valetta Mole M.D.   On: 04/28/2021 10:51   ECHOCARDIOGRAM COMPLETE  Result Date: 04/29/2021    ECHOCARDIOGRAM REPORT   Patient Name:   Christine Eaton Date of Exam: 04/29/2021 Medical Rec #:  [id number]        Height:       66.5 in Accession #:    [id number]       Weight:       238.0 lb Date of Birth:  08-Jun-1960        BSA:          2.165 m Patient Age:    61 years         BP:           125/78 mmHg Patient Gender: F                HR:           68 bpm. Exam Location:  Inpatient Procedure: 2D Echo, 3D Echo, Cardiac Doppler, Color Doppler and Strain Analysis Indications:    Chest Pain R07.9  History:        Patient has no prior history of Echocardiogram examinations.                 Signs/Symptoms:Altered Mental Status; Risk Factors:Hypertension                 and Sleep Apnea.  Chest tightness and associated shortness of                 breath and diaphoresis.  Sonographer:    Darlina Sicilian RDCS Referring Phys: [id number] [place]  1. Left ventricular ejection fraction, by estimation, is 60 to 65%. Left ventricular ejection fraction by 2D MOD biplane is 63.2 %. The left ventricle has normal function. The left ventricle has no regional wall motion abnormalities. Left ventricular diastolic parameters were normal. The average left ventricular global longitudinal strain is -21.0 %. The global longitudinal strain is normal.  2. Right ventricular systolic function is normal. The right ventricular size is normal. Tricuspid regurgitation signal is inadequate for assessing PA pressure.  3. Left atrial size was mildly dilated.  4. The mitral valve is normal in structure. No evidence of mitral valve regurgitation. No evidence of mitral stenosis.  5. The aortic valve is tricuspid. Aortic valve regurgitation is not visualized. No aortic stenosis is present. Comparison(s): No prior Echocardiogram. FINDINGS  Left Ventricle: Left ventricular ejection fraction, by  estimation, is 60 to 65%. Left ventricular ejection fraction by 2D MOD biplane is 63.2 %. The left ventricle has normal function. The left ventricle has no regional wall motion abnormalities. The average left ventricular global longitudinal strain is -21.0 %. The global longitudinal strain is normal. The left ventricular internal cavity size was normal in size. There is no left ventricular hypertrophy. Left ventricular diastolic parameters were normal. Right Ventricle: The right ventricular size is normal. No increase in right ventricular wall thickness. Right ventricular systolic function is normal. Tricuspid regurgitation signal is inadequate for assessing PA pressure. Left Atrium: Left atrial size was mildly dilated. Right Atrium: Right atrial size was normal in size. Pericardium: There is no evidence of pericardial effusion. Mitral Valve: The mitral valve is normal in structure. No evidence of mitral valve regurgitation. No evidence of mitral valve stenosis. Tricuspid Valve: The tricuspid valve is normal in structure. Tricuspid valve regurgitation is not demonstrated. No evidence of tricuspid stenosis. Aortic Valve: The aortic valve is tricuspid. Aortic valve regurgitation is not visualized. No aortic stenosis is present. Pulmonic Valve: The pulmonic valve was normal in structure. Pulmonic valve regurgitation is trivial. No evidence of pulmonic stenosis. Aorta: The aortic root and ascending aorta are structurally normal, with no evidence of dilitation. IAS/Shunts: No atrial level shunt detected by color flow Doppler.  LEFT VENTRICLE PLAX 2D                        Biplane EF (MOD) LVIDd:         4.40 cm         LV Biplane EF:   Left LVIDs:         2.40 cm                          ventricular LV PW:         0.90 cm                          ejection LV IVS:        1.00 cm                          fraction by LVOT diam:     1.80 cm  2D MOD LV SV:         66                                biplane is LV SV Index:   30                               63.2 %. LVOT Area:     2.54 cm                                Diastology                                LV e' medial:    7.38 cm/s LV Volumes (MOD)               LV E/e' medial:  9.6 LV vol d, MOD    110.0 ml      LV e' lateral:   10.80 cm/s A2C:                           LV E/e' lateral: 6.6 LV vol d, MOD    99.7 ml A4C:                           2D LV vol s, MOD    43.6 ml       Longitudinal A2C:                           Strain LV vol s, MOD    36.0 ml       2D Strain GLS  -21.0 % A4C:                           Avg: LV SV MOD A2C:   66.4 ml LV SV MOD A4C:   99.7 ml LV SV MOD BP:    67.3 ml RIGHT VENTRICLE RV S prime:     22.00 cm/s TAPSE (M-mode): 3.1 cm LEFT ATRIUM             Index        RIGHT ATRIUM           Index LA diam:        3.20 cm 1.48 cm/m   RA Area:     13.10 cm LA Vol (A2C):   72.3 ml 33.39 ml/m  RA Volume:   26.50 ml  12.24 ml/m LA Vol (A4C):   72.5 ml 33.48 ml/m LA Biplane Vol: 76.4 ml 35.28 ml/m  AORTIC VALVE LVOT Vmax:   119.00 cm/s LVOT Vmean:  79.500 cm/s LVOT VTI:    0.258 m  AORTA Ao Root diam: 3.20 cm Ao Asc diam:  3.40 cm MITRAL VALVE MV Area (PHT): 3.85 cm    SHUNTS MV Decel Time: 197 msec    Systemic VTI:  0.26 m MV E velocity: 71.00 cm/s  Systemic Diam: 1.80 cm MV A velocity: 68.10 cm/s MV E/A ratio:  1.04 Rudean Haskell MD Electronically signed by Rudean Haskell MD Signature Date/Time: 04/29/2021/10:26:33 AM    Final     Cardiac  Studies:  Assessment/Plan:  Chest pain with shortness of breath/diaphoresis worrisome for angina, MI ruled out Hypertension. Elevated blood sugar rule out diabetes mellitus Morbid obesity. Obstructive sleep apnea. Plan Continue present management per primary team Will proceed with the nuclear stress test today and if negative for ischemia okay to discharge from cardiac point of view  LOS: 0 days    Charolette Forward 04/29/2021, 12:23 PM

## 2021-04-29 NOTE — ED Notes (Signed)
Nuclear med called and asked when patient last ate. She had ate at 6am. They instructed [place] to keep her NPO for now until she is able to have stress test. Patient instructed not to eat or drink and medications held.

## 2021-04-29 NOTE — Progress Notes (Signed)
Tolerate stress test well

## 2021-04-29 NOTE — Progress Notes (Signed)
Jay for IV heparin Indication: chest pain/ACS  Allergies  Allergen Reactions   Lavender Oil Hives   Citalopram Hydrobromide Other (See Comments)    Pt stated, "weight gain"   Lisinopril Cough    Patient Measurements:   Heparin Dosing Weight: 84.58 kg  Vital Signs: BP: 114/73 (01/24 0600) Pulse Rate: 52 (01/24 0600)  Labs: Recent Labs    04/28/21 1008 04/28/21 1400 04/28/21 1731 04/28/21 2322 04/29/21 0645 04/29/21 0646  HGB 13.2  --   --   --  13.2  --   HCT 40.8  --   --   --  40.9  --   PLT 283  --   --   --  289  --   HEPARINUNFRC  --   --   --  0.54  --  0.22*  CREATININE 0.71  --   --   --  0.76  --   TROPONINIHS 8 22* 25*  --   --   --      CrCl cannot be calculated (Unknown ideal weight.).   Medical History: Past Medical History:  Diagnosis Date   Anxiety    Back pain    Depression    Hypertension    states under control with meds., has been on med. > 15 yr.   Infertility, female    Knee pain    Mass of right hand 09/2015   Neck pain    Assessment: 69 YOF presenting with shortness of breath and diaphoresis. No history of anticoagulation prior to admission. Pharmacy to dose IV heparin.   Heparin level came back subtherapeutic at 0.22, on 1100 units/hr. Hgb 13.2, plt 289. No s/sx of bleeding or infusion issues.    Goal of Therapy:  Heparin level 0.3-0.7 units/ml Monitor platelets by anticoagulation protocol: Yes   Plan:  Increase IV heparin gtt to 1250 units/h F/u 6 hr heparin level Daily heparin level, CBC Monitor for signs/symptoms of bleeding  Thank you for involving pharmacy in this patient's care.  Antonietta Jewel, PharmD, [place] Clinical Pharmacist  Phone: [phone] 04/29/2021 7:21 AM  Please check AMION for all [facility] phone numbers After 10:00 PM, call [place] [phone]

## 2021-04-29 NOTE — Progress Notes (Signed)
[place] for IV heparin Indication: chest pain/ACS  Allergies  Allergen Reactions   Lavender Oil Hives   Citalopram Hydrobromide Other (See Comments)    Pt stated, "weight gain"   Lisinopril Cough    Patient Measurements:   Heparin Dosing Weight: 84.58 kg  Vital Signs: Temp: 98.7 F (37.1 C) (01/23 1329) Temp Source: Oral (01/23 1329) BP: 110/67 (01/23 2245) Pulse Rate: 62 (01/23 2245)  Labs: Recent Labs    04/28/21 1008 04/28/21 1400 04/28/21 1731 04/28/21 2322  HGB 13.2  --   --   --   HCT 40.8  --   --   --   PLT 283  --   --   --   HEPARINUNFRC  --   --   --  0.54  CREATININE 0.71  --   --   --   TROPONINIHS 8 22* 25*  --      CrCl cannot be calculated (Unknown ideal weight.).   Medical History: Past Medical History:  Diagnosis Date   Anxiety    Back pain    Depression    Hypertension    states under control with meds., has been on med. > 15 yr.   Infertility, female    Knee pain    Mass of right hand 09/2015   Neck pain    Assessment: 75 YOF presenting with shortness of breath and diaphoresis. No history of anticoagulation prior to admission. Pharmacy to dose IV heparin.   Currently on IV heparin at 1100 units/hr. Initial HL is therapeutic at 0.54. RN reports no s/s of bleeding    Goal of Therapy:  Heparin level 0.3-0.7 units/ml Monitor platelets by anticoagulation protocol: Yes   Plan:  Continue IV heparin gtt @ 1100 units/h F/u confirmatory HL in AM  Daily heparin level, CBC Monitor for signs/symptoms of bleeding  Thank you for involving pharmacy in this patient's care.  Albertina Parr, PharmD., BCCCP Clinical Pharmacist Please refer to [facility] for unit-specific pharmacist

## 2021-04-29 NOTE — ED Notes (Signed)
Breakfast orders placed 

## 2021-04-29 NOTE — Discharge Summary (Signed)
Physician Discharge Summary  Christine Eaton JOI:[id number] DOB: [birth year] DOA: 04/28/2021  PCP: Lavone Orn, MD  Admit date: 04/28/2021 Discharge date: 04/29/2021  Admitted From: Home Disposition: Home  Recommendations for Outpatient Follow-up:  Follow up with PCP in 1-2 weeks Schedule follow-up with cardiology  Home Health: N/A Equipment/Devices: N/A  Discharge Condition: Stable CODE STATUS: Full code Diet recommendation: Low-salt diet  Discharge summary: 61 year old female with history of hypertension, prediabetes, obstructive sleep apnea on CPAP at night presented with chest tightness associated shortness of breath and diaphoresis of 1 day with ongoing dry cough for last few days.  No history of coronary artery disease.  At the emergency room hemodynamically stable.  EKG normal.  Troponins 11-26-23.  Chest x-ray normal.  COVID influenza negative.  Started on heparin infusion and admitted to the hospital.  Chest pain: Atypical.  Acute coronary syndrome ruled out.  Currently asymptomatic.  Heparin discontinued.  Echocardiogram was essentially normal.  Follow-up EKG and troponins nonischemic. Underwent Lexiscan that showed no evidence of reversible ischemia, rather small fixed defect at the apex.  Seen by cardiology.  Conservative management advised. She is already optimized on blood pressure medications.  LDL is optimally controlled. Will add aspirin 81 mg daily on discharge. She will schedule follow-up with cardiology.  Other chronic medical issues including hypertension, sleep apnea remained stable.  Potassium is replaced and stable.  Suggested symptomatic management, NSAIDs for recurrent chest pain.    Discharge Diagnoses:  Principal Problem:   Chest pain Active Problems:   Hypertension   Severe obstructive sleep apnea-hypopnea syndrome   Hypokalemia   Prediabetes    Discharge Instructions  Discharge Instructions     Call MD for:  difficulty breathing, headache or  visual disturbances   Complete by: As directed    Call MD for:  severe uncontrolled pain   Complete by: As directed    Diet - low sodium heart healthy   Complete by: As directed    Increase activity slowly   Complete by: As directed       Allergies as of 04/29/2021       Reactions   Lavender Oil Hives   Citalopram Hydrobromide Other (See Comments)   Pt stated, "weight gain"   Lisinopril Cough        Medication List     STOP taking these medications    ciprofloxacin 500 MG tablet Commonly known as: CIPRO       TAKE these medications    amLODipine-valsartan 10-320 MG tablet Commonly known as: EXFORGE Take 1 tablet by mouth daily.   aspirin 81 MG EC tablet Take 1 tablet (81 mg total) by mouth daily. Swallow whole. Start taking on: April 30, 2021   chlorthalidone 25 MG tablet Commonly known as: HYGROTON Take 1 tablet (25 mg total) by mouth in the morning with food What changed: Another medication with the same name was removed. Continue taking this medication, and follow the directions you see here.   multivitamin tablet Take 1 tablet by mouth daily.   Pfizer COVID-19 Vac Bivalent injection Generic drug: COVID-19 mRNA bivalent vaccine Therapist, music) Inject into the muscle.   potassium chloride 10 MEQ tablet Commonly known as: KLOR-CON M Take 1 tablet (10 mEq total) by mouth daily with food What changed: Another medication with the same name was removed. Continue taking this medication, and follow the directions you see here.   valACYclovir 1000 MG tablet Commonly known as: VALTREX Take 1 tablet (1,000 mg total) by mouth daily.  Vitamin C 500 MG Caps Take 1 capsule by mouth daily.   Vitamin D-3 125 MCG (5000 UT) Tabs Take 1 tablet by mouth daily.        Follow-up Information     Lavone Orn, MD Follow up in 2 week(s).   Specialty: Internal Medicine Contact information: [address] [place] [address] [phone]          Charolette Forward, MD. Schedule an appointment as soon as possible for a visit in 1 week(s).   Specialty: Cardiology Contact information: [place] [address] [phone]                Allergies  Allergen Reactions   Lavender Oil Hives   Citalopram Hydrobromide Other (See Comments)    Pt stated, "weight gain"   Lisinopril Cough    Consultations: Cardiology, Dr. Terrence Dupont   Procedures/Studies: DG Chest 2 View  Result Date: 04/28/2021 CLINICAL DATA:  Shortness of breath while walking to work this morning, chest tightness EXAM: CHEST - 2 VIEW COMPARISON:  Chest radiograph 08/21/2003 FINDINGS: The cardiomediastinal silhouette is normal. Linear opacities in the left mid lung likely reflect platelike atelectasis. Otherwise, there is no focal consolidation or pulmonary edema. There is no pleural effusion or pneumothorax There is no acute osseous abnormality. IMPRESSION: Linear opacity in the left mid lung likely reflects atelectasis. Otherwise, no radiographic evidence of acute cardiopulmonary process. Electronically Signed   By: Valetta Mole M.D.   On: 04/28/2021 10:51   NM Myocar Multi W/Spect W/Wall Motion / EF  Result Date: 04/29/2021 CLINICAL DATA:  61 year old female presents with chest pain EXAM: MYOCARDIAL IMAGING WITH SPECT (REST AND PHARMACOLOGIC-STRESS) GATED LEFT VENTRICULAR WALL MOTION STUDY LEFT VENTRICULAR EJECTION FRACTION TECHNIQUE: Standard myocardial SPECT imaging was performed after resting intravenous injection of 10.8 mCi Tc-85m tetrofosmin. Subsequently, intravenous infusion of Lexiscan was performed under the supervision of the Cardiology staff. At peak effect of the drug, 32.1 mCi Tc-17m tetrofosmin was injected intravenously and standard myocardial SPECT imaging was performed. Quantitative gated imaging was also performed to evaluate left ventricular wall motion, and estimate left ventricular ejection fraction. COMPARISON:  None.  FINDINGS: Perfusion: Decreased uptake at the apex on the rest and stress images, compatible with small fixed infarct. There is a region in the inferior septal wall mid ventricle, calculated as a reversible perfusion defect, favored to be artifact given the absence of wall motion abnormality in the overall appearance on the stress and rest images. Wall Motion: Normal left ventricular wall motion. No left ventricular dilation. Left Ventricular Ejection Fraction: 70 % End diastolic volume 474 ml End systolic volume 32 ml IMPRESSION: 1. Small region of decreased uptake at the apex on rest and stress images, favoring scar/completed infarct. There is a small region of the inferior septal wall mid ventricle which is calculated as a reversible perfusion defect, though is favored more likely artifact given the overall appearance. 2. Normal left ventricular wall motion. 3. Left ventricular ejection fraction 70% 4. Non invasive risk stratification*: Low *2012 Appropriate Use Criteria for Coronary Revascularization Focused Update: J Am Coll Cardiol. 2595;63(8):756-433. http://content.airportbarriers.com.aspx?articleid=[email or web address] Electronically Signed   By: Corrie Mckusick D.O.   On: 04/29/2021 15:56   ECHOCARDIOGRAM COMPLETE  Result Date: 04/29/2021    ECHOCARDIOGRAM REPORT   Patient Name:   Christine Eaton Date of Exam: 04/29/2021 Medical Rec #:  [id number]        Height:       66.5 in Accession #:  [id number]       Weight:       238.0 lb Date of Birth:  [birth year]        BSA:          2.165 m Patient Age:    61 years         BP:           125/78 mmHg Patient Gender: F                HR:           68 bpm. Exam Location:  Inpatient Procedure: 2D Echo, 3D Echo, Cardiac Doppler, Color Doppler and Strain Analysis Indications:    Chest Pain R07.9  History:        Patient has no prior history of Echocardiogram examinations.                 Signs/Symptoms:Altered Mental Status; Risk Factors:Hypertension                 and Sleep  Apnea. Chest tightness and associated shortness of                 breath and diaphoresis.  Sonographer:    Darlina Sicilian RDCS Referring Phys: [id number] [place]  1. Left ventricular ejection fraction, by estimation, is 60 to 65%. Left ventricular ejection fraction by 2D MOD biplane is 63.2 %. The left ventricle has normal function. The left ventricle has no regional wall motion abnormalities. Left ventricular diastolic parameters were normal. The average left ventricular global longitudinal strain is -21.0 %. The global longitudinal strain is normal.  2. Right ventricular systolic function is normal. The right ventricular size is normal. Tricuspid regurgitation signal is inadequate for assessing PA pressure.  3. Left atrial size was mildly dilated.  4. The mitral valve is normal in structure. No evidence of mitral valve regurgitation. No evidence of mitral stenosis.  5. The aortic valve is tricuspid. Aortic valve regurgitation is not visualized. No aortic stenosis is present. Comparison(s): No prior Echocardiogram. FINDINGS  Left Ventricle: Left ventricular ejection fraction, by estimation, is 60 to 65%. Left ventricular ejection fraction by 2D MOD biplane is 63.2 %. The left ventricle has normal function. The left ventricle has no regional wall motion abnormalities. The average left ventricular global longitudinal strain is -21.0 %. The global longitudinal strain is normal. The left ventricular internal cavity size was normal in size. There is no left ventricular hypertrophy. Left ventricular diastolic parameters were normal. Right Ventricle: The right ventricular size is normal. No increase in right ventricular wall thickness. Right ventricular systolic function is normal. Tricuspid regurgitation signal is inadequate for assessing PA pressure. Left Atrium: Left atrial size was mildly dilated. Right Atrium: Right atrial size was normal in size. Pericardium: There is no evidence of pericardial  effusion. Mitral Valve: The mitral valve is normal in structure. No evidence of mitral valve regurgitation. No evidence of mitral valve stenosis. Tricuspid Valve: The tricuspid valve is normal in structure. Tricuspid valve regurgitation is not demonstrated. No evidence of tricuspid stenosis. Aortic Valve: The aortic valve is tricuspid. Aortic valve regurgitation is not visualized. No aortic stenosis is present. Pulmonic Valve: The pulmonic valve was normal in structure. Pulmonic valve regurgitation is trivial. No evidence of pulmonic stenosis. Aorta: The aortic root and ascending aorta are structurally normal, with no evidence of dilitation. IAS/Shunts: No atrial level shunt detected by color flow Doppler.  LEFT VENTRICLE PLAX 2D  Biplane EF (MOD) LVIDd:         4.40 cm         LV Biplane EF:   Left LVIDs:         2.40 cm                          ventricular LV PW:         0.90 cm                          ejection LV IVS:        1.00 cm                          fraction by LVOT diam:     1.80 cm                          2D MOD LV SV:         66                               biplane is LV SV Index:   30                               63.2 %. LVOT Area:     2.54 cm                                Diastology                                LV e' medial:    7.38 cm/s LV Volumes (MOD)               LV E/e' medial:  9.6 LV vol d, MOD    110.0 ml      LV e' lateral:   10.80 cm/s A2C:                           LV E/e' lateral: 6.6 LV vol d, MOD    99.7 ml A4C:                           2D LV vol s, MOD    43.6 ml       Longitudinal A2C:                           Strain LV vol s, MOD    36.0 ml       2D Strain GLS  -21.0 % A4C:                           Avg: LV SV MOD A2C:   66.4 ml LV SV MOD A4C:   99.7 ml LV SV MOD BP:    67.3 ml RIGHT VENTRICLE RV S prime:     22.00 cm/s TAPSE (M-mode): 3.1 cm LEFT ATRIUM             Index  RIGHT ATRIUM           Index LA diam:        3.20 cm 1.48 cm/m   RA  Area:     13.10 cm LA Vol (A2C):   72.3 ml 33.39 ml/m  RA Volume:   26.50 ml  12.24 ml/m LA Vol (A4C):   72.5 ml 33.48 ml/m LA Biplane Vol: 76.4 ml 35.28 ml/m  AORTIC VALVE LVOT Vmax:   119.00 cm/s LVOT Vmean:  79.500 cm/s LVOT VTI:    0.258 m  AORTA Ao Root diam: 3.20 cm Ao Asc diam:  3.40 cm MITRAL VALVE MV Area (PHT): 3.85 cm    SHUNTS MV Decel Time: 197 msec    Systemic VTI:  0.26 m MV E velocity: 71.00 cm/s  Systemic Diam: 1.80 cm MV A velocity: 68.10 cm/s MV E/A ratio:  1.04 Rudean Haskell MD Electronically signed by Rudean Haskell MD Signature Date/Time: 04/29/2021/10:26:33 AM    Final    (Echo, Carotid, EGD, Colonoscopy, ERCP)    Subjective: Patient seen and examined in the morning rounds.  Denied any complaints.  She still gets some pain on the left upper chest when coughing.  No reproducible chest pain.  Overnight no other events.   Discharge Exam: Vitals:   04/29/21 1357 04/29/21 1358  BP: (!) 157/95 (!) 153/97  Pulse: 96 89  Resp:    Temp:    SpO2:     Vitals:   04/29/21 1355 04/29/21 1356 04/29/21 1357 04/29/21 1358  BP: (!) 173/114 (!) 151/93 (!) 157/95 (!) 153/97  Pulse: 95 92 96 89  Resp:      Temp:      TempSrc:      SpO2:      Weight:      Height:        General: Pt is alert, awake, not in acute distress Cardiovascular: RRR, S1/S2 +, no rubs, no gallops Respiratory: CTA bilaterally, no wheezing, no rhonchi Abdominal: Soft, NT, ND, bowel sounds + Extremities: no edema, no cyanosis    The results of significant diagnostics from this hospitalization (including imaging, microbiology, ancillary and laboratory) are listed below for reference.     Microbiology: Recent Results (from the past 240 hour(s))  Resp Panel by RT-PCR (Flu A&B, Covid) Nasopharyngeal Swab     Status: None   Collection Time: 04/28/21 10:06 AM   Specimen: Nasopharyngeal Swab; Nasopharyngeal(NP) swabs in vial transport medium  Result Value Ref Range Status   SARS Coronavirus  2 by RT PCR NEGATIVE NEGATIVE Final    Comment: (NOTE) SARS-CoV-2 target nucleic acids are NOT DETECTED.  The SARS-CoV-2 RNA is generally detectable in upper respiratory specimens during the acute phase of infection. The lowest concentration of SARS-CoV-2 viral copies this assay can detect is 138 copies/mL. A negative result does not preclude SARS-Cov-2 infection and should not be used as the sole basis for treatment or other patient management decisions. A negative result may occur with  improper specimen collection/handling, submission of specimen other than nasopharyngeal swab, presence of viral mutation(s) within the areas targeted by this assay, and inadequate number of viral copies(<138 copies/mL). A negative result must be combined with clinical observations, patient history, and epidemiological information. The expected result is Negative.  Fact Sheet for Patients:  EntrepreneurPulse.com.au  Fact Sheet for Healthcare Providers:  IncredibleEmployment.be  This test is no t yet approved or cleared by the [place] FDA and  has been authorized for detection and/or diagnosis of SARS-CoV-2 by  FDA under an Emergency Use Authorization (EUA). This EUA will remain  in effect (meaning this test can be used) for the duration of the COVID-19 declaration under Section 564(b)(1) of the Act, 21 U.S.C.section 360bbb-3(b)(1), unless the authorization is terminated  or revoked sooner.       Influenza A by PCR NEGATIVE NEGATIVE Final   Influenza B by PCR NEGATIVE NEGATIVE Final    Comment: (NOTE) The Xpert Xpress SARS-CoV-2/FLU/RSV plus assay is intended as an aid in the diagnosis of influenza from Nasopharyngeal swab specimens and should not be used as a sole basis for treatment. Nasal washings and aspirates are unacceptable for Xpert Xpress SARS-CoV-2/FLU/RSV testing.  Fact Sheet for Patients: EntrepreneurPulse.com.au  Fact  Sheet for Healthcare Providers: IncredibleEmployment.be  This test is not yet approved or cleared by the [place] FDA and has been authorized for detection and/or diagnosis of SARS-CoV-2 by FDA under an Emergency Use Authorization (EUA). This EUA will remain in effect (meaning this test can be used) for the duration of the COVID-19 declaration under Section 564(b)(1) of the Act, 21 U.S.C. section 360bbb-3(b)(1), unless the authorization is terminated or revoked.  Performed at [facility], [place] [address], [place], [place] [address]      Labs: BNP (last 3 results) Recent Labs    04/28/21 1400  BNP 52.8   Basic Metabolic Panel: Recent Labs  Lab 04/28/21 1008 04/28/21 1400 04/29/21 0645  NA 138  --  139  K 3.2*  --  3.7  CL 102  --  103  CO2 25  --  26  GLUCOSE 138*  --  118*  BUN 11  --  9  CREATININE 0.71  --  0.76  CALCIUM 9.0  --  8.7*  MG  --  2.1  --    Liver Function Tests: Recent Labs  Lab 04/28/21 1008  AST 25  ALT 24  ALKPHOS 80  BILITOT 0.6  PROT 6.6  ALBUMIN 3.2*   No results for input(s): LIPASE, AMYLASE in the last 168 hours. No results for input(s): AMMONIA in the last 168 hours. CBC: Recent Labs  Lab 04/28/21 1008 04/29/21 0645  WBC 5.2 4.2  NEUTROABS 3.9  --   HGB 13.2 13.2  HCT 40.8 40.9  MCV 80.8 81.3  PLT 283 289   Cardiac Enzymes: No results for input(s): CKTOTAL, CKMB, CKMBINDEX, TROPONINI in the last 168 hours. BNP: Invalid input(s): POCBNP CBG: No results for input(s): GLUCAP in the last 168 hours. D-Dimer Recent Labs    04/28/21 1008  DDIMER 0.55*   Hgb A1c No results for input(s): HGBA1C in the last 72 hours. Lipid Profile Recent Labs    04/29/21 0645  CHOL 174  HDL 67  LDLCALC 94  TRIG 63  CHOLHDL 2.6   Thyroid function studies No results for input(s): TSH, T4TOTAL, T3FREE, THYROIDAB in the last 72 hours.  Invalid input(s): FREET3 Anemia work up No results for input(s):  VITAMINB12, FOLATE, FERRITIN, TIBC, IRON, RETICCTPCT in the last 72 hours. Urinalysis    Component Value Date/Time   LABSPEC 1.010 08/08/2008 1830   PHURINE 5.5 08/08/2008 1830   GLUCOSEU NEGATIVE 08/08/2008 1830   HGBUR TRACE (A) 08/08/2008 1830   BILIRUBINUR NEGATIVE 08/08/2008 1830   KETONESUR NEGATIVE 08/08/2008 1830   PROTEINUR NEGATIVE 08/08/2008 1830   UROBILINOGEN 0.2 08/08/2008 1830   NITRITE NEGATIVE 08/08/2008 1830   LEUKOCYTESUR  08/08/2008 1830    NEGATIVE Biochemical Testing Only. Please order routine urinalysis from main  lab if confirmatory testing is needed.   Sepsis Labs Invalid input(s): PROCALCITONIN,  WBC,  LACTICIDVEN Microbiology Recent Results (from the past 240 hour(s))  Resp Panel by RT-PCR (Flu A&B, Covid) Nasopharyngeal Swab     Status: None   Collection Time: 04/28/21 10:06 AM   Specimen: Nasopharyngeal Swab; Nasopharyngeal(NP) swabs in vial transport medium  Result Value Ref Range Status   SARS Coronavirus 2 by RT PCR NEGATIVE NEGATIVE Final    Comment: (NOTE) SARS-CoV-2 target nucleic acids are NOT DETECTED.  The SARS-CoV-2 RNA is generally detectable in upper respiratory specimens during the acute phase of infection. The lowest concentration of SARS-CoV-2 viral copies this assay can detect is 138 copies/mL. A negative result does not preclude SARS-Cov-2 infection and should not be used as the sole basis for treatment or other patient management decisions. A negative result may occur with  improper specimen collection/handling, submission of specimen other than nasopharyngeal swab, presence of viral mutation(s) within the areas targeted by this assay, and inadequate number of viral copies(<138 copies/mL). A negative result must be combined with clinical observations, patient history, and epidemiological information. The expected result is Negative.  Fact Sheet for Patients:  EntrepreneurPulse.com.au  Fact Sheet for Healthcare  Providers:  IncredibleEmployment.be  This test is no t yet approved or cleared by the [place] FDA and  has been authorized for detection and/or diagnosis of SARS-CoV-2 by FDA under an Emergency Use Authorization (EUA). This EUA will remain  in effect (meaning this test can be used) for the duration of the COVID-19 declaration under Section 564(b)(1) of the Act, 21 U.S.C.section 360bbb-3(b)(1), unless the authorization is terminated  or revoked sooner.       Influenza A by PCR NEGATIVE NEGATIVE Final   Influenza B by PCR NEGATIVE NEGATIVE Final    Comment: (NOTE) The Xpert Xpress SARS-CoV-2/FLU/RSV plus assay is intended as an aid in the diagnosis of influenza from Nasopharyngeal swab specimens and should not be used as a sole basis for treatment. Nasal washings and aspirates are unacceptable for Xpert Xpress SARS-CoV-2/FLU/RSV testing.  Fact Sheet for Patients: EntrepreneurPulse.com.au  Fact Sheet for Healthcare Providers: IncredibleEmployment.be  This test is not yet approved or cleared by the [place] FDA and has been authorized for detection and/or diagnosis of SARS-CoV-2 by FDA under an Emergency Use Authorization (EUA). This EUA will remain in effect (meaning this test can be used) for the duration of the COVID-19 declaration under Section 564(b)(1) of the Act, 21 U.S.C. section 360bbb-3(b)(1), unless the authorization is terminated or revoked.  Performed at [facility], [place] [address], [place], [place] [address]      Time coordinating discharge:  28 minutes  SIGNED:   Barb Merino, MD  [facility] 04/29/2021, 4:30 PM

## 2021-05-06 ENCOUNTER — Other Ambulatory Visit (HOSPITAL_COMMUNITY): Payer: Self-pay

## 2021-05-06 MED ORDER — METOPROLOL SUCCINATE ER 25 MG PO TB24
25.0000 mg | ORAL_TABLET | Freq: Every evening | ORAL | 3 refills | Status: DC
  Filled 2021-05-06 – 2021-05-08 (×2): qty 90, 90d supply, fill #0
  Filled 2021-10-01: qty 90, 90d supply, fill #1

## 2021-05-07 ENCOUNTER — Other Ambulatory Visit (HOSPITAL_COMMUNITY): Payer: Self-pay

## 2021-05-07 NOTE — Patient Instructions (Addendum)

## 2021-05-07 NOTE — Progress Notes (Signed)
PATIENT: Christine Eaton DOB: 1961/01/05  REASON FOR VISIT: follow up HISTORY FROM: patient  Chief Complaint  Patient presents with   Obstructive Sleep Apnea    Rm 10, alone. Here for CPAP f/u. Pt reports doing ok. Pt is needing a best way to clean it. Was told by DME to use dawn dish soap. Has noticed a reddish film in her mask. Wondering how frequent she should receive supplies.      HISTORY OF PRESENT ILLNESS: 05/08/21 ALL:  Christine Eaton returns for follow up for OSA on CPAP. She is adjusting to therapy. Now using CPAP nightly. Most all nights she uses for at least 4 hours. She admits that she does rest better when using CPAP. She denies concerns with machine but has been working with DME in regards to cleaning supplies. She reports a reddish colored film in the headgear that she is unable to remove. It has significantly improved since switching to [facility] and vinegar for cleaning. She reports DME told her it was ok to use Dawn. She continues to work nights.      11/05/2020 ALL:  Christine Eaton is a 61 y.o. female here today for follow up for OSA on CPAP.  She was last seen by Dr Brett Fairy 04/2020 and having difficulty with compliance due to have anxiety with mask. She was advised to try nasal pillow. Since, she feels that she is doing much better. She does not feel as anxious with the nasal pillow. She admits that she does not use her CPAP every night. Most nights she will go to sleep with it on but then pull it off during the night. She reports that DME uses her home phone and personal email to contact her. She does always respond to these calls. 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      HISTORY: (copied from Dr Dohemier's previous note)  Christine Eaton is a 61  Year- old  African American female patient here in a RV after Sleep study and trial of CPAP therapy -  on 04/17/2020 originally referred by Dr Briscoe Deutscher, DO.   HST June 2021 : Overall AHI is indicative of severe sleep apnea  at 60/h and was not REM sleep dependent. The AHI is highest in supine at 64.7/h. Intermittent sleep hypoxia and bradycardia were noted. Recommendations:                  This degree of apnea can be treated with CPAP/BiPAP or with an Inspire device (BMI need to reach 32 or less for Inspire). The patient will also benefit from avoiding supine sleep and from weight loss. I will proceed to order an autotitration CPAP device for a setting from 6-18 cm water, 2 cm EPR and heated humidity with a mask of patient's choice.   Christine Eaton reports that she received the machine and GI 2021 but only used it for about 4 nights.  She was uncomfortable visit but could not get any feedback from the medical equipment company.  She had been referred to [facility] on [place].  She also has not taken the machine apart to properly clean it and got no guidance on how to put it back together.  The machine was set up for the range of pressure that I have prescribed 6 through 18 cmH2O was 2 cm EPR and she used it for a total of 21 hours in 4 days her residual AHI was 3.1/h down from 60/h  so it seems that the machine worked but she was not comfortable with the results.  She had moderate air leakage the 95th percentile pressure she required was 12.6 cm of water. She had reported the air-leakage to the DME and could not get help.   She still owns the machine and she brought it with her, I will use today's visit to try a nasal pillow mask with her and have her try  A nasal pillow.   REVIEW OF SYSTEMS: Out of a complete 14 system review of symptoms, the patient complains only of the following symptoms, none and all other reviewed systems are negative.  ESS: 7, previously 7   ALLERGIES: Allergies  Allergen Reactions   Lavender Oil Hives   Citalopram Hydrobromide Other (See Comments)    Pt stated, "weight gain"   Lisinopril Cough    HOME MEDICATIONS: Outpatient Medications Prior to Visit  Medication Sig Dispense  Refill   amLODipine-valsartan (EXFORGE) 10-320 MG tablet Take 1 tablet by mouth daily. 90 tablet 3   aspirin 81 MG EC tablet Take 1 tablet (81 mg total) by mouth daily. Swallow whole. 30 tablet 11   chlorthalidone (HYGROTON) 25 MG tablet Take 1 tablet (25 mg total) by mouth in the morning with food 90 tablet 3   Cholecalciferol (VITAMIN D-3) 125 MCG (5000 UT) TABS Take 1 tablet by mouth daily.     metoprolol succinate (TOPROL-XL) 25 MG 24 hr tablet Take 1 tablet (25 mg total) by mouth every evening. 90 tablet 3   Multiple Vitamin (MULTIVITAMIN) tablet Take 1 tablet by mouth daily.     potassium chloride (KLOR-CON) 10 MEQ tablet Take 1 tablet (10 mEq total) by mouth daily with food 90 tablet 3   valACYclovir (VALTREX) 1000 MG tablet Take 1 tablet (1,000 mg total) by mouth daily. 90 tablet 3   Ascorbic Acid (VITAMIN C) 500 MG CAPS Take 1 capsule by mouth daily.     COVID-19 mRNA bivalent vaccine, Pfizer, (PFIZER COVID-19 VAC BIVALENT) injection Inject into the muscle. 0.3 mL 0   No facility-administered medications prior to visit.    PAST MEDICAL HISTORY: Past Medical History:  Diagnosis Date   Anxiety    Back pain    Depression    Hypertension    states under control with meds., has been on med. > 15 yr.   Infertility, female    Knee pain    Mass of right hand 09/2015   Neck pain     PAST SURGICAL HISTORY: Past Surgical History:  Procedure Laterality Date   BREAST EXCISIONAL BIOPSY Right 2005   BREAST MASS EXCISION Right 08/23/2003   CHROMOPERTUBATION  04/15/2001   COLONOSCOPY  06/25/2011   Procedure: COLONOSCOPY;  Surgeon: Garlan Fair, MD;  Location: [facility];  Service: Endoscopy;  Laterality: N/A;  MAC   EXCISION OF ENDOMETRIOMA Right 04/15/2001   right tube   INGUINAL HERNIA REPAIR Left    KNEE ARTHROSCOPY  02/02/2012   Procedure: ARTHROSCOPY KNEE;  Surgeon: Lorn Junes, MD;  Location: [place];  Service: Orthopedics;  Laterality: Left;  ARTHROSCOPY  KNEE WITH DEBRIDEMENT/SHAVING (CHONDROPLASTY)   LAPAROSCOPIC LYSIS OF ADHESIONS  04/15/2001; 05/24/2007   MASS EXCISION Right 10/02/2015   Procedure: EXCISION OF RIGHT HAND  MASS;  Surgeon: Charlotte Crumb, MD;  Location: [place];  Service: Orthopedics;  Laterality: Right;   REPAIR VAGINAL CUFF  06/18/2007   ROBOTIC ASSISTED TOTAL HYSTERECTOMY WITH BILATERAL SALPINGO OOPHERECTOMY  05/24/2007  FAMILY HISTORY: Family History  Problem Relation Age of Onset   Hypertension Mother    Cancer Mother        OVARIAN   Obesity Mother    Diabetes Father    Cancer Father        LUNG   Hypertension Father    Obesity Father    Hypertension Maternal Aunt    Hypertension Maternal Uncle    Hypertension Maternal Aunt    Colon cancer Neg Hx    Breast cancer Neg Hx     SOCIAL HISTORY: Social History   Socioeconomic History   Marital status: Widowed    Spouse name: Not on file   Number of children: Not on file   Years of education: Not on file   Highest education level: Not on file  Occupational History   Occupation: phlebotomist  Tobacco Use   Smoking status: Never   Smokeless tobacco: Never  Substance and Sexual Activity   Alcohol use: Yes    Comment: occasionally   Drug use: No   Sexual activity: Not Currently    Birth control/protection: Surgical  Other Topics Concern   Not on file  Social History Narrative   Not on file   Social Determinants of Health   Financial Resource Strain: Not on file  Food Insecurity: Not on file  Transportation Needs: Not on file  Physical Activity: Not on file  Stress: Not on file  Social Connections: Not on file  Intimate Partner Violence: Not on file     PHYSICAL EXAM  Vitals:   05/08/21 1534  BP: 104/72  Pulse: 92  SpO2: 97%  Weight: 250 lb (113.4 kg)  Height: _0  (1.702 m)    Body mass index is 39.16 kg/m.  Generalized: Well developed, in no acute distress  Cardiology: normal rate and rhythm, no murmur  noted Respiratory: clear to auscultation bilaterally  Neurological examination  Mentation: Alert oriented to time, place, history taking. Follows all commands speech and language fluent Cranial nerve II-XII: Pupils were equal round reactive to light. Extraocular movements were full, visual field were full  Motor: The motor testing reveals 5 over 5 strength of all 4 extremities. Good symmetric motor tone is noted throughout.  Gait and station: Gait is normal.    DIAGNOSTIC DATA (LABS, IMAGING, TESTING) - I reviewed patient records, labs, notes, testing and imaging myself where available.  No flowsheet data found.   Lab Results  Component Value Date   WBC 4.2 04/29/2021   HGB 13.2 04/29/2021   HCT 40.9 04/29/2021   MCV 81.3 04/29/2021   PLT 289 04/29/2021      Component Value Date/Time   NA 139 04/29/2021 0645   NA 141 07/20/2019 1007   K 3.7 04/29/2021 0645   CL 103 04/29/2021 0645   CO2 26 04/29/2021 0645   GLUCOSE 118 (H) 04/29/2021 0645   BUN 9 04/29/2021 0645   BUN 14 07/20/2019 1007   CREATININE 0.76 04/29/2021 0645   CALCIUM 8.7 (L) 04/29/2021 0645   PROT 6.6 04/28/2021 1008   PROT 7.1 07/20/2019 1007   ALBUMIN 3.2 (L) 04/28/2021 1008   ALBUMIN 4.4 07/20/2019 1007   AST 25 04/28/2021 1008   ALT 24 04/28/2021 1008   ALKPHOS 80 04/28/2021 1008   BILITOT 0.6 04/28/2021 1008   BILITOT 0.4 07/20/2019 1007   GFRNONAA >60 04/29/2021 0645   GFRAA 112 07/20/2019 1007   Lab Results  Component Value Date   CHOL 174 04/29/2021  HDL 67 04/29/2021   LDLCALC 94 04/29/2021   TRIG 63 04/29/2021   CHOLHDL 2.6 04/29/2021   No results found for: HGBA1C Lab Results  Component Value Date   VITAMINB12 479 07/20/2019   Lab Results  Component Value Date   TSH 0.671 07/20/2019     ASSESSMENT AND PLAN 61 y.o. year old female  has a past medical history of Anxiety, Back pain, Depression, Hypertension, Infertility, female, Knee pain, Mass of right hand (09/2015), and Neck  pain. here with     ICD-10-CM   1. OSA on CPAP  G47.33 For home use only DME continuous positive airway pressure (CPAP)   Z99.89       Christine Eaton is doing better on CPAP therapy. Compliance report reveals excellent daily and acceptable 4 hour compliance. She was encouraged to continue using CPAP nightly and for greater than 4 hours each night. We will update supply orders as indicated. Risks of untreated sleep apnea review and education materials provided. She will continue cleaning recommendations per DME. Healthy lifestyle habits encouraged. She will follow up in 1 year sooner if needed. She verbalizes understanding and agreement with this plan.    Orders Placed This Encounter  Procedures   For home use only DME continuous positive airway pressure (CPAP)    Supplies    Order Specific Question:   Length of Need    Answer:   Lifetime    Order Specific Question:   Patient has OSA or probable OSA    Answer:   Yes    Order Specific Question:   Is the patient currently using CPAP in the home    Answer:   Yes    Order Specific Question:   Settings    Answer:   Other see comments    Order Specific Question:   CPAP supplies needed    Answer:   Mask, headgear, cushions, filters, heated tubing and water chamber      No orders of the defined types were placed in this encounter.      Debbora Presto, FNP-C 05/08/2021, 3:59 PM [facility] [address] [place], [place] [address] [phone]

## 2021-05-08 ENCOUNTER — Ambulatory Visit: Payer: No Typology Code available for payment source | Admitting: Family Medicine

## 2021-05-08 ENCOUNTER — Other Ambulatory Visit: Payer: Self-pay

## 2021-05-08 ENCOUNTER — Encounter: Payer: Self-pay | Admitting: Family Medicine

## 2021-05-08 ENCOUNTER — Other Ambulatory Visit (HOSPITAL_COMMUNITY): Payer: Self-pay

## 2021-05-08 VITALS — BP 104/72 | HR 92 | Ht 67.0 in | Wt 250.0 lb

## 2021-05-08 DIAGNOSIS — Z9989 Dependence on other enabling machines and devices: Secondary | ICD-10-CM

## 2021-05-08 DIAGNOSIS — G4733 Obstructive sleep apnea (adult) (pediatric): Secondary | ICD-10-CM | POA: Diagnosis not present

## 2021-05-08 NOTE — Progress Notes (Signed)
CM sent to [facility] for new orders.

## 2021-06-16 ENCOUNTER — Ambulatory Visit (INDEPENDENT_AMBULATORY_CARE_PROVIDER_SITE_OTHER): Payer: No Typology Code available for payment source

## 2021-06-16 ENCOUNTER — Encounter: Payer: Self-pay | Admitting: Podiatry

## 2021-06-16 ENCOUNTER — Ambulatory Visit (INDEPENDENT_AMBULATORY_CARE_PROVIDER_SITE_OTHER): Payer: No Typology Code available for payment source | Admitting: Podiatry

## 2021-06-16 ENCOUNTER — Other Ambulatory Visit: Payer: Self-pay

## 2021-06-16 DIAGNOSIS — S99922A Unspecified injury of left foot, initial encounter: Secondary | ICD-10-CM

## 2021-06-16 NOTE — Progress Notes (Signed)
Subjective:  ? ?Patient ID: Christine Eaton, female   DOB: 61 y.o.   MRN: [id number]  ? ?HPI ?Patient presents stating that she traumatized her left second toe and is concerned about fracture and wants to have it checked ? ? ?ROS ? ? ?   ?Objective:  ?Physical Exam  ?Neurovascular status intact muscle strength found to be adequate with patient to second digit left found to be moderately swollen at the proximal and distal interphalangeal joint ? ?   ?Assessment:  ?Possibility for fracture second digit left ? ?   ?Plan:  ?Precautionary x-ray reviewed patient to wear wider shoes and not force but it should heal gradually but may take 8 to 12 weeks to really heal completely and patient will be seen back as needed ? ?X-rays were negative for signs of fracture does appear to be soft tissue contusion ?   ? ? ?

## 2021-06-23 ENCOUNTER — Other Ambulatory Visit (HOSPITAL_COMMUNITY): Payer: Self-pay

## 2021-06-27 ENCOUNTER — Other Ambulatory Visit (HOSPITAL_COMMUNITY): Payer: Self-pay

## 2021-08-25 ENCOUNTER — Other Ambulatory Visit (HOSPITAL_COMMUNITY): Payer: Self-pay

## 2021-08-25 ENCOUNTER — Other Ambulatory Visit: Payer: Self-pay | Admitting: Physician Assistant

## 2021-08-25 ENCOUNTER — Ambulatory Visit
Admission: RE | Admit: 2021-08-25 | Discharge: 2021-08-25 | Disposition: A | Discharge status: Home / Self Care | Payer: No Typology Code available for payment source | Source: Ambulatory Visit | Attending: Physician Assistant | Admitting: Physician Assistant

## 2021-08-25 DIAGNOSIS — M25562 Pain in left knee: Secondary | ICD-10-CM

## 2021-08-25 MED ORDER — TRAMADOL HCL 50 MG PO TABS
50.0000 mg | ORAL_TABLET | Freq: Every day | ORAL | 0 refills | Status: AC
  Filled 2021-08-25: qty 10, 10d supply, fill #0

## 2021-08-28 DIAGNOSIS — M25562 Pain in left knee: Secondary | ICD-10-CM | POA: Insufficient documentation

## 2021-09-05 ENCOUNTER — Other Ambulatory Visit (HOSPITAL_COMMUNITY): Payer: Self-pay

## 2021-09-15 ENCOUNTER — Other Ambulatory Visit (HOSPITAL_COMMUNITY): Payer: Self-pay

## 2021-09-15 MED ORDER — MOUNJARO 2.5 MG/0.5ML ~~LOC~~ SOAJ
2.5000 mg | SUBCUTANEOUS | 0 refills | Status: DC
  Filled 2021-09-15 – 2021-10-01 (×5): qty 2, 28d supply, fill #0

## 2021-09-18 ENCOUNTER — Other Ambulatory Visit (HOSPITAL_COMMUNITY): Payer: Self-pay

## 2021-09-18 MED ORDER — METFORMIN HCL 500 MG PO TABS
500.0000 mg | ORAL_TABLET | Freq: Every day | ORAL | 0 refills | Status: DC
Start: 2021-09-18 — End: 2022-07-01
  Filled 2021-09-18 – 2021-10-01 (×2): qty 30, 30d supply, fill #0

## 2021-09-22 ENCOUNTER — Other Ambulatory Visit (HOSPITAL_COMMUNITY): Payer: Self-pay

## 2021-09-24 ENCOUNTER — Ambulatory Visit: Payer: No Typology Code available for payment source | Attending: Physician Assistant | Admitting: Physical Therapy

## 2021-09-24 ENCOUNTER — Encounter: Payer: Self-pay | Admitting: Physical Therapy

## 2021-09-24 DIAGNOSIS — M6281 Muscle weakness (generalized): Secondary | ICD-10-CM | POA: Diagnosis present

## 2021-09-24 DIAGNOSIS — M25562 Pain in left knee: Secondary | ICD-10-CM | POA: Diagnosis present

## 2021-09-24 DIAGNOSIS — G8929 Other chronic pain: Secondary | ICD-10-CM | POA: Diagnosis present

## 2021-09-24 DIAGNOSIS — R262 Difficulty in walking, not elsewhere classified: Secondary | ICD-10-CM | POA: Diagnosis present

## 2021-09-24 NOTE — Therapy (Signed)
OUTPATIENT PHYSICAL THERAPY LOWER EXTREMITY EVALUATION   Patient Name: Christine Eaton MRN: [id number] DOB:07/15/1960, 61 y.o., female Today's Date: 09/24/2021   PT End of Session - 09/24/21 1413     Visit Number 1    Number of Visits 25    Date for PT Re-Evaluation 11/19/21    Authorization Type [employer]    PT Start Time 1408    PT Stop Time 1443    PT Time Calculation (min) 35 min    Activity Tolerance Patient tolerated treatment well             Past Medical History:  Diagnosis Date   Anxiety    Back pain    Depression    Hypertension    states under control with meds., has been on med. > 15 yr.   Infertility, female    Knee pain    Mass of right hand 09/2015   Neck pain    Past Surgical History:  Procedure Laterality Date   BREAST EXCISIONAL BIOPSY Right 2005   BREAST MASS EXCISION Right 08/23/2003   CHROMOPERTUBATION  04/15/2001   COLONOSCOPY  06/25/2011   Procedure: COLONOSCOPY;  Surgeon: Garlan Fair, MD;  Location: [facility];  Service: Endoscopy;  Laterality: N/A;  MAC   EXCISION OF ENDOMETRIOMA Right 04/15/2001   right tube   INGUINAL HERNIA REPAIR Left    KNEE ARTHROSCOPY  02/02/2012   Procedure: ARTHROSCOPY KNEE;  Surgeon: Lorn Junes, MD;  Location: [place];  Service: Orthopedics;  Laterality: Left;  ARTHROSCOPY KNEE WITH DEBRIDEMENT/SHAVING (CHONDROPLASTY)   LAPAROSCOPIC LYSIS OF ADHESIONS  04/15/2001; 05/24/2007   MASS EXCISION Right 10/02/2015   Procedure: EXCISION OF RIGHT HAND  MASS;  Surgeon: Charlotte Crumb, MD;  Location: [place];  Service: Orthopedics;  Laterality: Right;   REPAIR VAGINAL CUFF  06/18/2007   ROBOTIC ASSISTED TOTAL HYSTERECTOMY WITH BILATERAL SALPINGO OOPHERECTOMY  05/24/2007   Patient Active Problem List   Diagnosis Date Noted   Hypokalemia 04/28/2021   Chest pain 04/28/2021   Prediabetes 04/28/2021   Encounter for counseling on use of CPAP 04/17/2020   Severe obstructive sleep  apnea-hypopnea syndrome 04/17/2020   Hypokalemia due to loss of potassium 08/23/2019   Snoring 08/23/2019   Morbid obesity ([place]) 08/23/2019   Candidiasis of vagina 08/12/2017   Chronic back pain 08/12/2017   Herpes simplex 08/12/2017   Obesity 08/12/2017   Ganglion of right hand 05/21/2015   Hypertension    Plantar fasciitis    Fibroadenoma of right breast    Fibroids    Pelvic pain in female    Acute medial meniscus tear of left knee     PCP: Lavone Orn MD  REFERRING PROVIDER: Thayer Ohm PA  REFERRING DIAG: M25.562 pain in left knee joint  THERAPY DIAG:  Left knee pain; generalized weakness  Rationale for Evaluation and Treatment Rehabilitation  ONSET DATE: 08/24/21  SUBJECTIVE:   SUBJECTIVE STATEMENT: The patient reports a >1 month history of left knee pain for no apparent reason.  She reports an recent steroid injection helped quite a bit.    PERTINENT HISTORY: HTN; depression; plantar fascitis 2013 meniscus repair left knee   PAIN:  Are you having pain? No NPRS scale: 0/10 Pain location: lateral left knee  Aggravating factors: sit to stand; walk > 10 min; up stairs; prolonged sitting; kneeling to clean the shower/tub; kneeling to say prayers Relieving factors: sitting   PRECAUTIONS: None  WEIGHT BEARING RESTRICTIONS No  FALLS:  Has patient fallen in last 6 months? 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[employer]; mostly desk work  PLOF: Independent  PATIENT GOALS be able to walk long distance (goal is 3 miles) Walk my dog   OBJECTIVE:   DIAGNOSTIC FINDINGS: x-rays mild arthritis patellofemoral arthritis; mod medial compartment arthritis  PATIENT SURVEYS:  FOTO 57%    MUSCLE LENGTH: HS WFLS bil;  mild decrease in hip flexor lengths bil    LOWER EXTREMITY ROM:  Active ROM Right eval Left eval  Hip flexion 90 90  Hip extension    Hip abduction    Hip adduction     Hip internal rotation    Hip external rotation    Knee flexion 122 110  Knee extension 0 0  Ankle dorsiflexion    Ankle plantarflexion    Ankle inversion    Ankle eversion     (Blank rows = not tested)  LOWER EXTREMITY MMT: Able to rise sit to stand without UE assist with ease  SLS > 10 sec bil however lateral trunk lean on left   MMT Right eval Left eval  Hip flexion 4+ 4+  Hip extension    Hip abduction 4 3+  Hip adduction    Hip internal rotation    Hip external rotation 4+ 3+  Knee flexion 5 4+  Knee extension 4+ 4  Ankle dorsiflexion    Ankle plantarflexion 4 4  Ankle inversion    Ankle eversion     (Blank rows = not tested)  LOWER EXTREMITY SPECIAL TESTS:  Knee special tests: Step up/down test: positive  compensatory trunk flexion, lateral lean and genu valgus +pain on left  GAIT: Comments: no device; no limp    TODAY'S TREATMENT: Instruction in initial HEP   PATIENT EDUCATION:  Education details: basic HEP Person educated: Patient Education method: Explanation, Demonstration, and Handouts Education comprehension: verbalized understanding   HOME EXERCISE PROGRAM: Access Code: [email or web address] URL: [email or web address] Date: 09/24/2021 Prepared by: Ruben Im  Exercises - Straight Leg Raise  - 1 x daily - 7 x weekly - 2 sets - 10 reps - Beginner Clam (Mirrored)  - 1 x daily - 7 x weekly - 2 sets - 10 reps - Beginner Side Leg Lift  - 1 x daily - 7 x weekly - 2 sets - 10 reps - Standing Heel Raise with Support  - 1 x daily - 7 x weekly - 2-3 sets - 10 reps - Sit to Stand Without Arm Support  - 1 x daily - 7 x weekly - 1 sets - 10 reps  ASSESSMENT:  CLINICAL IMPRESSION: Patient is a 61 y.o. female who was seen today for physical therapy evaluation and treatment for left knee pain.  Decreased knee flexion ROM on left and weakness noted in quads and hip abductors.  She would benefit from PT to address muscle imbalance and improve function  and pain.     OBJECTIVE IMPAIRMENTS decreased activity tolerance, decreased ROM, decreased strength, impaired perceived functional ability, and pain.   ACTIVITY LIMITATIONS lifting, bending, standing, squatting, and locomotion level  PARTICIPATION LIMITATIONS: meal prep, cleaning, laundry, shopping, community activity, and occupation  [place] and 1 comorbidity: previous knee surgery  are also affecting patient's functional outcome.   REHAB POTENTIAL: Good  CLINICAL DECISION MAKING: Stable/uncomplicated  EVALUATION COMPLEXITY: Low   GOALS: Goals reviewed with patient? Yes  SHORT TERM GOALS: Target date: 10/22/2021  The patient will  demonstrate knowledge of basic self care strategies and exercises to promote healing   Baseline: Goal status: INITIAL  2.  The patient will report a 40% improvement in pain levels with functional activities  Baseline:  Goal status: INITIAL  3.  The patient will have improved knee flexion ROM to 120 degrees needed for greater ease with ascending/descending steps and curbs   Baseline:  Goal status: INITIAL  LONG TERM GOALS: Target date: 11/19/2021   The patient will be independent in a safe self progression of a home exercise program to promote further recovery of function   Baseline:  Goal status: INITIAL  2.  The patient will report a 70% improvement in pain levels with functional activities  Baseline:  Goal status: INITIAL  3.  The patient will have improved hip and knee strength to at least 4+/5 needed for standing, walking longer distances and descending stairs at home and in the community  Baseline:  Goal status: INITIAL  4.  The patient will have improved gait stamina and speed needed to ambulate 1 mile with pain 3/10 Baseline:  Goal status: INITIAL  5.  The patient will have improved FOTO score to    71%   indicating improved function with less pain  Baseline:  Goal status: INITIAL    PLAN: PT FREQUENCY:  1x/week  the patient has limited availability for visits secondary to work  PT DURATION: 8 weeks  PLANNED INTERVENTIONS: Therapeutic exercises, Therapeutic activity, Neuromuscular re-education, Gait training, Patient/Family education, Joint mobilization, Aquatic Therapy, Dry Needling, Electrical stimulation, Cryotherapy, Moist heat, Taping, Vasopneumatic device, Ultrasound, Ionotophoresis 52m/ml Dexamethasone, and Manual therapy  PLAN FOR NEXT SESSION: review HEP and progress with focus on quads and glute medius strengthening; see if patient is using the stationary bike at work; try leg press and hip mLoyal PT 09/24/21 8:56 PM Phone: [phone]: [phone]

## 2021-09-30 ENCOUNTER — Other Ambulatory Visit (HOSPITAL_COMMUNITY): Payer: Self-pay

## 2021-10-01 ENCOUNTER — Other Ambulatory Visit (HOSPITAL_COMMUNITY): Payer: Self-pay

## 2021-10-01 MED ORDER — SUTAB 1479-225-188 MG PO TABS
ORAL_TABLET | ORAL | 0 refills | Status: DC
  Filled 2021-10-01: qty 24, 1d supply, fill #0

## 2021-10-06 ENCOUNTER — Other Ambulatory Visit (HOSPITAL_COMMUNITY): Payer: Self-pay

## 2021-10-15 ENCOUNTER — Ambulatory Visit: Payer: No Typology Code available for payment source | Attending: Physician Assistant | Admitting: Physical Therapy

## 2021-10-15 DIAGNOSIS — G8929 Other chronic pain: Secondary | ICD-10-CM

## 2021-10-15 DIAGNOSIS — M6281 Muscle weakness (generalized): Secondary | ICD-10-CM | POA: Diagnosis present

## 2021-10-15 DIAGNOSIS — R262 Difficulty in walking, not elsewhere classified: Secondary | ICD-10-CM

## 2021-10-15 DIAGNOSIS — M25562 Pain in left knee: Secondary | ICD-10-CM | POA: Diagnosis not present

## 2021-10-15 NOTE — Therapy (Addendum)
OUTPATIENT PHYSICAL THERAPY LOWER EXTREMITY PROGRESS [place] SUMMARY   Patient Name: Christine Eaton MRN: [id number] DOB:Nov 16, 1960, 61 y.o., female Today's Date: 10/15/2021   PT End of Session - 10/15/21 1536     Visit Number 2    Number of Visits 25    Date for PT Re-Evaluation 11/19/21    Authorization Type [employer]    PT Start Time 1531    PT Stop Time 1614    PT Time Calculation (min) 43 min    Activity Tolerance Patient tolerated treatment well             Past Medical History:  Diagnosis Date   Anxiety    Back pain    Depression    Hypertension    states under control with meds., has been on med. > 15 yr.   Infertility, female    Knee pain    Mass of right hand 09/2015   Neck pain    Past Surgical History:  Procedure Laterality Date   BREAST EXCISIONAL BIOPSY Right 2005   BREAST MASS EXCISION Right 08/23/2003   CHROMOPERTUBATION  04/15/2001   COLONOSCOPY  06/25/2011   Procedure: COLONOSCOPY;  Surgeon: Garlan Fair, MD;  Location: [facility];  Service: Endoscopy;  Laterality: N/A;  MAC   EXCISION OF ENDOMETRIOMA Right 04/15/2001   right tube   INGUINAL HERNIA REPAIR Left    KNEE ARTHROSCOPY  02/02/2012   Procedure: ARTHROSCOPY KNEE;  Surgeon: Lorn Junes, MD;  Location: [facility];  Service: Orthopedics;  Laterality: Left;  ARTHROSCOPY KNEE WITH DEBRIDEMENT/SHAVING (CHONDROPLASTY)   LAPAROSCOPIC LYSIS OF ADHESIONS  04/15/2001; 05/24/2007   MASS EXCISION Right 10/02/2015   Procedure: EXCISION OF RIGHT HAND  MASS;  Surgeon: Charlotte Crumb, MD;  Location: [facility];  Service: Orthopedics;  Laterality: Right;   REPAIR VAGINAL CUFF  06/18/2007   ROBOTIC ASSISTED TOTAL HYSTERECTOMY WITH BILATERAL SALPINGO OOPHERECTOMY  05/24/2007   Patient Active Problem List   Diagnosis Date Noted   Hypokalemia 04/28/2021   Chest pain 04/28/2021   Prediabetes 04/28/2021   Encounter for counseling on use of CPAP 04/17/2020   Severe  obstructive sleep apnea-hypopnea syndrome 04/17/2020   Hypokalemia due to loss of potassium 08/23/2019   Snoring 08/23/2019   Morbid obesity ([place]) 08/23/2019   Candidiasis of vagina 08/12/2017   Chronic back pain 08/12/2017   Herpes simplex 08/12/2017   Obesity 08/12/2017   Ganglion of right hand 05/21/2015   Hypertension    Plantar fasciitis    Fibroadenoma of right breast    Fibroids    Pelvic pain in female    Acute medial meniscus tear of left knee     PCP: Lavone Orn MD  REFERRING PROVIDER: Thayer Ohm PA  REFERRING DIAG: M25.562 pain in left knee joint  THERAPY DIAG:  Left knee pain; generalized weakness  Rationale for Evaluation and Treatment Rehabilitation  ONSET DATE: 08/24/21  SUBJECTIVE:   SUBJECTIVE STATEMENT: On vacation last week and busy with work so haven't had a chance to get into a routine with the exercises.      PERTINENT HISTORY: HTN; depression; plantar fascitis 2013 meniscus repair left knee   PAIN:  Are you having pain? No NPRS scale: 0/10 Pain location: lateral left knee  Aggravating factors: sit to stand; walk > 10 min; up stairs; prolonged sitting; kneeling to clean the shower/tub; kneeling to say prayers Relieving factors: sitting   PRECAUTIONS: None  WEIGHT BEARING RESTRICTIONS No  FALLS:  Has patient fallen in last 6 months? 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[employer]; mostly desk work  PLOF: Independent  PATIENT GOALS be able to walk long distance (goal is 3 miles) Walk my dog   OBJECTIVE:   DIAGNOSTIC FINDINGS: x-rays mild arthritis patellofemoral arthritis; mod medial compartment arthritis  PATIENT SURVEYS:  FOTO 57%    MUSCLE LENGTH: HS WFLS bil;  mild decrease in hip flexor lengths bil    LOWER EXTREMITY ROM:  Active ROM Right eval Left eval  Hip flexion 90 90  Hip extension    Hip abduction    Hip adduction    Hip internal  rotation    Hip external rotation    Knee flexion 122 110  Knee extension 0 0  Ankle dorsiflexion    Ankle plantarflexion    Ankle inversion    Ankle eversion     (Blank rows = not tested)  LOWER EXTREMITY MMT: Able to rise sit to stand without UE assist with ease  SLS > 10 sec bil however lateral trunk lean on left   MMT Right eval Left eval  Hip flexion 4+ 4+  Hip extension    Hip abduction 4 3+  Hip adduction    Hip internal rotation    Hip external rotation 4+ 3+  Knee flexion 5 4+  Knee extension 4+ 4  Ankle dorsiflexion    Ankle plantarflexion 4 4  Ankle inversion    Ankle eversion     (Blank rows = not tested)  LOWER EXTREMITY SPECIAL TESTS:  Knee special tests: Step up/down test: positive  compensatory trunk flexion, lateral lean and genu valgus +pain on left  GAIT: Comments: no device; no limp    TODAY'S TREATMENT: 7/12: Nu-Step L1 6 min while discuss status SLR 2x10 left Sidelying hip abduction 10x left Sidelying clam 10x left Sit to stand 10x Standing heel raises 10x 6 inch step ups 10x 4 inch step ups 10x 2 inch step downs 10x with heel tap Standing hip abduction isometric against ball on wall 10x right/left 2nd step rocking for joint lubrication   PATIENT EDUCATION:  Education details: basic HEP Person educated: Patient Education method: Explanation, Media planner, and Handouts Education comprehension: verbalized understanding   HOME EXERCISE PROGRAM: Access Code: [email or web address] URL: [email or web address] Date: 10/15/2021 Prepared by: Ruben Im  Exercises - Straight Leg Raise  - 1 x daily - 7 x weekly - 2 sets - 10 reps - Beginner Clam (Mirrored)  - 1 x daily - 7 x weekly - 2 sets - 10 reps - Beginner Side Leg Lift  - 1 x daily - 7 x weekly - 2 sets - 10 reps - Standing Heel Raise with Support  - 1 x daily - 7 x weekly - 2-3 sets - 10 reps - Sit to Stand Without Arm Support  - 1 x daily - 7 x weekly - 1 sets - 10 reps -  Step Up  - 1 x daily - 7 x weekly - 1 sets - 10 reps - Forward Step Down  - 1 x daily - 7 x weekly - 1 sets - 10 reps - Standing Isometric Hip Abduction with Ball on Wall  - 1 x daily - 7 x weekly - 1 sets - 10 reps ASSESSMENT:  CLINICAL IMPRESSION: Low pain intensity or no pain with all ex's performed today.  Added a progression of strengthening challenge level.  The patient has financial  concerns about frequency of visits.  We discussed continuing her ex's on her own for the next month and then follow up 1 more time for a further progression before discharge.  Discussed basic modifications of ex's based on pain response.     OBJECTIVE IMPAIRMENTS decreased activity tolerance, decreased ROM, decreased strength, impaired perceived functional ability, and pain.   ACTIVITY LIMITATIONS lifting, bending, standing, squatting, and locomotion level  PARTICIPATION LIMITATIONS: meal prep, cleaning, laundry, shopping, community activity, and occupation  [place] and 1 comorbidity: previous knee surgery  are also affecting patient's functional outcome.   REHAB POTENTIAL: Good  CLINICAL DECISION MAKING: Stable/uncomplicated  EVALUATION COMPLEXITY: Low   GOALS: Goals reviewed with patient? Yes  SHORT TERM GOALS: Target date: 10/22/2021  The patient will demonstrate knowledge of basic self care strategies and exercises to promote healing   Baseline: Goal status: INITIAL  2.  The patient will report a 40% improvement in pain levels with functional activities  Baseline:  Goal status: INITIAL  3.  The patient will have improved knee flexion ROM to 120 degrees needed for greater ease with ascending/descending steps and curbs   Baseline:  Goal status: INITIAL  LONG TERM GOALS: Target date: 11/19/2021   The patient will be independent in a safe self progression of a home exercise program to promote further recovery of function   Baseline:  Goal status: INITIAL  2.  The  patient will report a 70% improvement in pain levels with functional activities  Baseline:  Goal status: INITIAL  3.  The patient will have improved hip and knee strength to at least 4+/5 needed for standing, walking longer distances and descending stairs at home and in the community  Baseline:  Goal status: INITIAL  4.  The patient will have improved gait stamina and speed needed to ambulate 1 mile with pain 3/10 Baseline:  Goal status: INITIAL  5.  The patient will have improved FOTO score to    71%   indicating improved function with less pain  Baseline:  Goal status: INITIAL    PLAN: PT FREQUENCY: 1x/week  the patient has limited availability for visits secondary to work  PT DURATION: 8 weeks  PLANNED INTERVENTIONS: Therapeutic exercises, Therapeutic activity, Neuromuscular re-education, Gait training, Patient/Family education, Joint mobilization, Aquatic Therapy, Dry Needling, Electrical stimulation, Cryotherapy, Moist heat, Taping, Vasopneumatic device, Ultrasound, Ionotophoresis 45m/ml Dexamethasone, and Manual therapy  PLAN FOR NEXT SESSION: follow up in 1 month to review HEP and progress with focus on quads and glute medius strengthening and then probable discharge  SRuben Im PT 10/15/21 4:21 PM Phone: [phone]: [phone]  PHYSICAL THERAPY DISCHARGE SUMMARY  Visits from Start of Care: 2  Current functional level related to goals / functional outcomes: The patient cancelled 1 month follow up.  Will discharge from PT at this time.     Remaining deficits: As above   Education / Equipment: Basic HEP   Patient agrees to discharge. Patient goals were not met. Patient is being discharged due to the patient's request.  SRuben Im PT 12/04/21 9:49 AM Phone: [phone]: [phone]

## 2021-10-28 DIAGNOSIS — M79672 Pain in left foot: Secondary | ICD-10-CM

## 2021-10-28 HISTORY — DX: Pain in left foot: M79.672

## 2021-11-04 DIAGNOSIS — R2240 Localized swelling, mass and lump, unspecified lower limb: Secondary | ICD-10-CM | POA: Insufficient documentation

## 2021-11-04 DIAGNOSIS — M2010 Hallux valgus (acquired), unspecified foot: Secondary | ICD-10-CM

## 2021-11-04 HISTORY — DX: Localized swelling, mass and lump, unspecified lower limb: R22.40

## 2021-11-04 HISTORY — DX: Hallux valgus (acquired), unspecified foot: M20.10

## 2021-11-11 ENCOUNTER — Ambulatory Visit: Payer: No Typology Code available for payment source | Admitting: Physical Therapy

## 2021-11-12 ENCOUNTER — Encounter (INDEPENDENT_AMBULATORY_CARE_PROVIDER_SITE_OTHER): Payer: Self-pay

## 2021-11-18 ENCOUNTER — Other Ambulatory Visit (HOSPITAL_COMMUNITY): Payer: Self-pay

## 2021-11-20 ENCOUNTER — Other Ambulatory Visit (HOSPITAL_COMMUNITY): Payer: Self-pay

## 2021-11-20 MED ORDER — VALACYCLOVIR HCL 1 G PO TABS
1000.0000 mg | ORAL_TABLET | Freq: Every day | ORAL | 0 refills | Status: DC
  Filled 2021-11-20: qty 30, 30d supply, fill #0

## 2021-12-12 LAB — HM COLONOSCOPY

## 2021-12-25 ENCOUNTER — Other Ambulatory Visit (HOSPITAL_COMMUNITY): Payer: Self-pay

## 2021-12-25 MED ORDER — VALACYCLOVIR HCL 1 G PO TABS
1000.0000 mg | ORAL_TABLET | Freq: Every day | ORAL | 3 refills | Status: DC
  Filled 2021-12-25: qty 90, 90d supply, fill #0
  Filled 2022-04-28: qty 90, 90d supply, fill #1

## 2022-01-07 ENCOUNTER — Other Ambulatory Visit: Payer: Self-pay | Admitting: Physician Assistant

## 2022-01-07 DIAGNOSIS — Z1231 Encounter for screening mammogram for malignant neoplasm of breast: Secondary | ICD-10-CM

## 2022-01-26 ENCOUNTER — Other Ambulatory Visit (HOSPITAL_COMMUNITY): Payer: Self-pay

## 2022-02-03 ENCOUNTER — Ambulatory Visit: Payer: No Typology Code available for payment source

## 2022-02-15 ENCOUNTER — Emergency Department (HOSPITAL_BASED_OUTPATIENT_CLINIC_OR_DEPARTMENT_OTHER): Payer: No Typology Code available for payment source | Admitting: Radiology

## 2022-02-15 ENCOUNTER — Encounter (HOSPITAL_BASED_OUTPATIENT_CLINIC_OR_DEPARTMENT_OTHER): Payer: Self-pay

## 2022-02-15 ENCOUNTER — Emergency Department (HOSPITAL_BASED_OUTPATIENT_CLINIC_OR_DEPARTMENT_OTHER)
Admission: EM | Admit: 2022-02-15 | Discharge: 2022-02-15 | Disposition: A | Discharge status: Home / Self Care | Payer: No Typology Code available for payment source | Attending: Emergency Medicine | Admitting: Emergency Medicine

## 2022-02-15 ENCOUNTER — Other Ambulatory Visit: Payer: Self-pay

## 2022-02-15 DIAGNOSIS — S161XXA Strain of muscle, fascia and tendon at neck level, initial encounter: Secondary | ICD-10-CM | POA: Insufficient documentation

## 2022-02-15 DIAGNOSIS — Z79899 Other long term (current) drug therapy: Secondary | ICD-10-CM | POA: Insufficient documentation

## 2022-02-15 DIAGNOSIS — Y92410 Unspecified street and highway as the place of occurrence of the external cause: Secondary | ICD-10-CM | POA: Diagnosis not present

## 2022-02-15 DIAGNOSIS — M25512 Pain in left shoulder: Secondary | ICD-10-CM

## 2022-02-15 DIAGNOSIS — I10 Essential (primary) hypertension: Secondary | ICD-10-CM | POA: Insufficient documentation

## 2022-02-15 DIAGNOSIS — S4992XA Unspecified injury of left shoulder and upper arm, initial encounter: Secondary | ICD-10-CM | POA: Diagnosis not present

## 2022-02-15 DIAGNOSIS — M542 Cervicalgia: Secondary | ICD-10-CM | POA: Diagnosis present

## 2022-02-15 DIAGNOSIS — Z7982 Long term (current) use of aspirin: Secondary | ICD-10-CM | POA: Diagnosis not present

## 2022-02-15 MED ORDER — NAPROXEN 375 MG PO TABS: 375.0000 mg | ORAL_TABLET | Freq: Two times a day (BID) | ORAL | 0 refills | Status: DC

## 2022-02-15 MED ORDER — METHOCARBAMOL 500 MG PO TABS: 500.0000 mg | ORAL_TABLET | Freq: Two times a day (BID) | ORAL | 0 refills | Status: DC
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
## 2022-02-16 ENCOUNTER — Other Ambulatory Visit (HOSPITAL_COMMUNITY): Payer: Self-pay

## 2022-02-16 MED ORDER — METHOCARBAMOL 500 MG PO TABS
500.0000 mg | ORAL_TABLET | Freq: Two times a day (BID) | ORAL | 0 refills | Status: DC
  Filled 2022-02-16: qty 20, 10d supply, fill #0

## 2022-02-16 MED ORDER — NAPROXEN 375 MG PO TABS
375.0000 mg | ORAL_TABLET | Freq: Two times a day (BID) | ORAL | 0 refills | Status: DC
  Filled 2022-02-16: qty 20, 10d supply, fill #0

## 2022-02-23 ENCOUNTER — Other Ambulatory Visit (HOSPITAL_COMMUNITY): Payer: Self-pay

## 2022-03-26 ENCOUNTER — Other Ambulatory Visit (HOSPITAL_COMMUNITY): Payer: Self-pay

## 2022-04-01 ENCOUNTER — Ambulatory Visit
Admission: RE | Admit: 2022-04-01 | Discharge: 2022-04-01 | Disposition: A | Discharge status: Home / Self Care | Payer: No Typology Code available for payment source | Source: Ambulatory Visit | Attending: Physician Assistant | Admitting: Physician Assistant

## 2022-04-01 DIAGNOSIS — Z1231 Encounter for screening mammogram for malignant neoplasm of breast: Secondary | ICD-10-CM

## 2022-04-10 DIAGNOSIS — H3581 Retinal edema: Secondary | ICD-10-CM | POA: Diagnosis not present

## 2022-04-13 ENCOUNTER — Other Ambulatory Visit (HOSPITAL_COMMUNITY): Payer: Self-pay

## 2022-04-13 MED ORDER — AMLODIPINE BESYLATE-VALSARTAN 10-320 MG PO TABS
1.0000 | ORAL_TABLET | Freq: Every day | ORAL | 3 refills | Status: DC
  Filled 2022-04-13: qty 90, 90d supply, fill #0

## 2022-05-13 NOTE — Progress Notes (Unsigned)
PATIENT: Christine Eaton DOB: 05/20/1960  REASON FOR VISIT: follow up HISTORY FROM: patient  No chief complaint on file.    HISTORY OF PRESENT ILLNESS:  05/13/22 ALL:  Christine Eaton returns for follow up for OSA on CPAP.   05/08/2021 ALL: Christine Eaton returns for follow up for OSA on CPAP. She is adjusting to therapy. Now using CPAP nightly. Most all nights she uses for at least 4 hours. She admits that she does rest better when using CPAP. She denies concerns with machine but has been working with DME in regards to cleaning supplies. She reports a reddish colored film in the headgear that she is unable to remove. It has significantly improved since switching to [facility] and vinegar for cleaning. She reports DME told her it was ok to use Dawn. She continues to work nights.      11/05/2020 ALL:  Christine Eaton is a 61 y.o. female here today for follow up for OSA on CPAP.  She was last seen by Dr Brett Fairy 04/2020 and having difficulty with compliance due to have anxiety with mask. She was advised to try nasal pillow. Since, she feels that she is doing much better. She does not feel as anxious with the nasal pillow. She admits that she does not use her CPAP every night. Most nights she will go to sleep with it on but then pull it off during the night. She reports that DME uses her home phone and personal email to contact her. She does always respond to these calls. 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      HISTORY: (copied from Dr Dohemier's previous note)  Christine Eaton is a 62  Year- old  African American female patient here in a RV after Sleep study and trial of CPAP therapy -  on 04/17/2020 originally referred by Dr Briscoe Deutscher, DO.   HST June 2021 : Overall AHI is indicative of severe sleep apnea at 60/h and was not REM sleep dependent. The AHI is highest in supine at 64.7/h. Intermittent sleep hypoxia and bradycardia were noted. Recommendations:                  This degree of apnea can be  treated with CPAP/BiPAP or with an Inspire device (BMI need to reach 32 or less for Inspire). The patient will also benefit from avoiding supine sleep and from weight loss. I will proceed to order an autotitration CPAP device for a setting from 6-18 cm water, 2 cm EPR and heated humidity with a mask of patient's choice.   Christine Eaton reports that she received the machine and GI 2021 but only used it for about 4 nights.  She was uncomfortable visit but could not get any feedback from the medical equipment company.  She had been referred to [facility] on [place].  She also has not taken the machine apart to properly clean it and got no guidance on how to put it back together.  The machine was set up for the range of pressure that I have prescribed 6 through 18 cmH2O was 2 cm EPR and she used it for a total of 21 hours in 4 days her residual AHI was 3.1/h down from 60/h so it seems that the machine worked but she was not comfortable with the results.  She had moderate air leakage the 95th percentile pressure she required was 12.6 cm of water. She had reported the air-leakage to the DME and  could not get help.   She still owns the machine and she brought it with her, I will use today's visit to try a nasal pillow mask with her and have her try  A nasal pillow.   REVIEW OF SYSTEMS: Out of a complete 14 system review of symptoms, the patient complains only of the following symptoms, none and all other reviewed systems are negative.  ESS: 7, previously 7   ALLERGIES: Allergies  Allergen Reactions   Lavender Oil Hives   Citalopram Hydrobromide Other (See Comments)    Pt stated, "weight gain"   Lisinopril Cough    HOME MEDICATIONS: Outpatient Medications Prior to Visit  Medication Sig Dispense Refill   amLODipine-valsartan (EXFORGE) 10-320 MG tablet Take 1 tablet by mouth daily. 90 tablet 3   amLODipine-valsartan (EXFORGE) 10-320 MG tablet Take 1 tablet by mouth daily. 90 tablet 3    aspirin 81 MG EC tablet Take 1 tablet (81 mg total) by mouth daily. Swallow whole. 30 tablet 11   chlorthalidone (HYGROTON) 25 MG tablet Take 1 tablet (25 mg total) by mouth in the morning with food 90 tablet 3   Cholecalciferol (VITAMIN D-3) 125 MCG (5000 UT) TABS Take 1 tablet by mouth daily.     metFORMIN (GLUCOPHAGE) 500 MG tablet Take 1 tablet (500 mg total) by mouth daily with a meal 30 tablet 0   methocarbamol (ROBAXIN) 500 MG tablet Take 1 tablet (500 mg total) by mouth 2 (two) times daily. 20 tablet 0   methocarbamol (ROBAXIN) 500 MG tablet Take 1 tablet (500 mg total) by mouth 2 (two) times daily. 20 tablet 0   metoprolol succinate (TOPROL-XL) 25 MG 24 hr tablet Take 1 tablet (25 mg total) by mouth every evening. 90 tablet 3   Multiple Vitamin (MULTIVITAMIN) tablet Take 1 tablet by mouth daily.     naproxen (NAPROSYN) 375 MG tablet Take 1 tablet (375 mg total) by mouth 2 (two) times daily. 20 tablet 0   naproxen (NAPROSYN) 375 MG tablet Take 1 tablet (375 mg total) by mouth 2 (two) times daily. 20 tablet 0   potassium chloride (KLOR-CON M) 10 MEQ tablet Take 1 tablet (10 mEq total) by mouth daily with food 90 tablet 3   Sodium Sulfate-Mag Sulfate-KCl (SUTAB) [phone] MG TABS Take as directed 24 tablet 0   tirzepatide (MOUNJARO) 2.5 MG/0.5ML Pen Inject 2.5 mg into the skin once a week. 2 mL 0   valACYclovir (VALTREX) 1000 MG tablet Take 1 tablet (1,000 mg total) by mouth daily. 90 tablet 3   No facility-administered medications prior to visit.    PAST MEDICAL HISTORY: Past Medical History:  Diagnosis Date   Anxiety    Back pain    Depression    Hypertension    states under control with meds., has been on med. > 15 yr.   Infertility, female    Knee pain    Mass of right hand 09/2015   Neck pain     PAST SURGICAL HISTORY: Past Surgical History:  Procedure Laterality Date   BREAST EXCISIONAL BIOPSY Right 2005   BREAST MASS EXCISION Right 08/23/2003   CHROMOPERTUBATION   04/15/2001   COLONOSCOPY  06/25/2011   Procedure: COLONOSCOPY;  Surgeon: Garlan Fair, MD;  Location: [facility];  Service: Endoscopy;  Laterality: N/A;  MAC   EXCISION OF ENDOMETRIOMA Right 04/15/2001   right tube   INGUINAL HERNIA REPAIR Left    KNEE ARTHROSCOPY  02/02/2012   Procedure: ARTHROSCOPY KNEE;  Surgeon:  Lorn Junes, MD;  Location: [place];  Service: Orthopedics;  Laterality: Left;  ARTHROSCOPY KNEE WITH DEBRIDEMENT/SHAVING (CHONDROPLASTY)   LAPAROSCOPIC LYSIS OF ADHESIONS  04/15/2001; 05/24/2007   MASS EXCISION Right 10/02/2015   Procedure: EXCISION OF RIGHT HAND  MASS;  Surgeon: Charlotte Crumb, MD;  Location: [place];  Service: Orthopedics;  Laterality: Right;   REPAIR VAGINAL CUFF  06/18/2007   ROBOTIC ASSISTED TOTAL HYSTERECTOMY WITH BILATERAL SALPINGO OOPHERECTOMY  05/24/2007    FAMILY HISTORY: Family History  Problem Relation Age of Onset   Hypertension Mother    Cancer Mother        OVARIAN   Obesity Mother    Diabetes Father    Cancer Father        LUNG   Hypertension Father    Obesity Father    Hypertension Maternal Aunt    Hypertension Maternal Uncle    Hypertension Maternal Aunt    Colon cancer Neg Hx    Breast cancer Neg Hx     SOCIAL HISTORY: Social History   Socioeconomic History   Marital status: Widowed    Spouse name: Not on file   Number of children: Not on file   Years of education: Not on file   Highest education level: Not on file  Occupational History   Occupation: phlebotomist  Tobacco Use   Smoking status: Never   Smokeless tobacco: Never  Substance and Sexual Activity   Alcohol use: Yes    Comment: occasionally   Drug use: No   Sexual activity: Not Currently    Birth control/protection: Surgical  Other Topics Concern   Not on file  Social History Narrative   Not on file   Social Determinants of Health   Financial Resource Strain: Not on file  Food Insecurity: Not on file   Transportation Needs: Not on file  Physical Activity: Not on file  Stress: Not on file  Social Connections: Not on file  Intimate Partner Violence: Not on file     PHYSICAL EXAM  There were no vitals filed for this visit.   There is no height or weight on file to calculate BMI.  Generalized: Well developed, in no acute distress  Cardiology: normal rate and rhythm, no murmur noted Respiratory: clear to auscultation bilaterally  Neurological examination  Mentation: Alert oriented to time, place, history taking. Follows all commands speech and language fluent Cranial nerve II-XII: Pupils were equal round reactive to light. Extraocular movements were full, visual field were full  Motor: The motor testing reveals 5 over 5 strength of all 4 extremities. Good symmetric motor tone is noted throughout.  Gait and station: Gait is normal.    DIAGNOSTIC DATA (LABS, IMAGING, TESTING) - I reviewed patient records, labs, notes, testing and imaging myself where available.      No data to display           Lab Results  Component Value Date   WBC 4.2 04/29/2021   HGB 13.2 04/29/2021   HCT 40.9 04/29/2021   MCV 81.3 04/29/2021   PLT 289 04/29/2021      Component Value Date/Time   NA 139 04/29/2021 0645   NA 141 07/20/2019 1007   K 3.7 04/29/2021 0645   CL 103 04/29/2021 0645   CO2 26 04/29/2021 0645   GLUCOSE 118 (H) 04/29/2021 0645   BUN 9 04/29/2021 0645   BUN 14 07/20/2019 1007   CREATININE 0.76 04/29/2021 0645   CALCIUM 8.7 (L) 04/29/2021  0645   PROT 6.6 04/28/2021 1008   PROT 7.1 07/20/2019 1007   ALBUMIN 3.2 (L) 04/28/2021 1008   ALBUMIN 4.4 07/20/2019 1007   AST 25 04/28/2021 1008   ALT 24 04/28/2021 1008   ALKPHOS 80 04/28/2021 1008   BILITOT 0.6 04/28/2021 1008   BILITOT 0.4 07/20/2019 1007   GFRNONAA >60 04/29/2021 0645   GFRAA 112 07/20/2019 1007   Lab Results  Component Value Date   CHOL 174 04/29/2021   HDL 67 04/29/2021   LDLCALC 94 04/29/2021    TRIG 63 04/29/2021   CHOLHDL 2.6 04/29/2021   No results found for: "HGBA1C" Lab Results  Component Value Date   VITAMINB12 479 07/20/2019   Lab Results  Component Value Date   TSH 0.671 07/20/2019     ASSESSMENT AND PLAN 62 y.o. year old female  has a past medical history of Anxiety, Back pain, Depression, Hypertension, Infertility, female, Knee pain, Mass of right hand (09/2015), and Neck pain. here with   No diagnosis found.   AMYA HLAD is doing better on CPAP therapy. Compliance report reveals excellent daily and acceptable 4 hour compliance. She was encouraged to continue using CPAP nightly and for greater than 4 hours each night. We will update supply orders as indicated. Risks of untreated sleep apnea review and education materials provided. She will continue cleaning recommendations per DME. Healthy lifestyle habits encouraged. She will follow up in 1 year sooner if needed. She verbalizes understanding and agreement with this plan.    No orders of the defined types were placed in this encounter.     No orders of the defined types were placed in this encounter.      Debbora Presto, FNP-C 05/13/2022, 3:11 PM [facility] [address] [place], [place] [address] [phone]

## 2022-05-13 NOTE — Patient Instructions (Signed)

## 2022-05-14 ENCOUNTER — Encounter: Payer: Self-pay | Admitting: Family Medicine

## 2022-05-14 ENCOUNTER — Ambulatory Visit: Payer: 59 | Admitting: Family Medicine

## 2022-05-14 VITALS — BP 105/70 | HR 70 | Ht 66.5 in | Wt 237.0 lb

## 2022-05-14 DIAGNOSIS — G4733 Obstructive sleep apnea (adult) (pediatric): Secondary | ICD-10-CM | POA: Diagnosis not present

## 2022-06-02 DIAGNOSIS — H35371 Puckering of macula, right eye: Secondary | ICD-10-CM | POA: Diagnosis not present

## 2022-06-03 ENCOUNTER — Other Ambulatory Visit (HOSPITAL_COMMUNITY): Payer: Self-pay

## 2022-06-03 MED ORDER — OFLOXACIN 0.3 % OP SOLN
1.0000 [drp] | Freq: Four times a day (QID) | OPHTHALMIC | 0 refills | Status: DC
  Filled 2022-06-03: qty 5, 7d supply, fill #0

## 2022-06-03 MED ORDER — PREDNISOLONE ACETATE 1 % OP SUSP
1.0000 [drp] | Freq: Four times a day (QID) | OPHTHALMIC | 0 refills | Status: DC
  Filled 2022-06-03: qty 10, 10d supply, fill #0

## 2022-06-08 ENCOUNTER — Other Ambulatory Visit (HOSPITAL_COMMUNITY): Payer: Self-pay

## 2022-06-08 DIAGNOSIS — H5315 Visual distortions of shape and size: Secondary | ICD-10-CM | POA: Diagnosis not present

## 2022-06-08 DIAGNOSIS — H35351 Cystoid macular degeneration, right eye: Secondary | ICD-10-CM | POA: Diagnosis not present

## 2022-06-08 DIAGNOSIS — H3581 Retinal edema: Secondary | ICD-10-CM | POA: Diagnosis not present

## 2022-06-08 DIAGNOSIS — H34831 Tributary (branch) retinal vein occlusion, right eye, with macular edema: Secondary | ICD-10-CM | POA: Diagnosis not present

## 2022-06-08 DIAGNOSIS — H3561 Retinal hemorrhage, right eye: Secondary | ICD-10-CM | POA: Diagnosis not present

## 2022-06-08 MED ORDER — KETOROLAC TROMETHAMINE 0.5 % OP SOLN
1.0000 [drp] | Freq: Three times a day (TID) | OPHTHALMIC | 3 refills | Status: DC
  Filled 2022-06-08: qty 5, 20d supply, fill #0

## 2022-06-22 DIAGNOSIS — H35371 Puckering of macula, right eye: Secondary | ICD-10-CM | POA: Diagnosis not present

## 2022-07-01 ENCOUNTER — Encounter: Payer: Self-pay | Admitting: Internal Medicine

## 2022-07-01 ENCOUNTER — Ambulatory Visit (INDEPENDENT_AMBULATORY_CARE_PROVIDER_SITE_OTHER): Payer: 59 | Admitting: Internal Medicine

## 2022-07-01 ENCOUNTER — Ambulatory Visit (INDEPENDENT_AMBULATORY_CARE_PROVIDER_SITE_OTHER): Payer: 59 | Admitting: Primary Care

## 2022-07-01 ENCOUNTER — Other Ambulatory Visit (HOSPITAL_COMMUNITY): Payer: Self-pay

## 2022-07-01 VITALS — BP 130/88 | HR 62 | Temp 98.2°F | Ht 66.5 in | Wt 234.4 lb

## 2022-07-01 DIAGNOSIS — Z566 Other physical and mental strain related to work: Secondary | ICD-10-CM

## 2022-07-01 DIAGNOSIS — E876 Hypokalemia: Secondary | ICD-10-CM

## 2022-07-01 DIAGNOSIS — T50905A Adverse effect of unspecified drugs, medicaments and biological substances, initial encounter: Secondary | ICD-10-CM | POA: Diagnosis not present

## 2022-07-01 DIAGNOSIS — E559 Vitamin D deficiency, unspecified: Secondary | ICD-10-CM

## 2022-07-01 DIAGNOSIS — Z119 Encounter for screening for infectious and parasitic diseases, unspecified: Secondary | ICD-10-CM | POA: Diagnosis not present

## 2022-07-01 DIAGNOSIS — E639 Nutritional deficiency, unspecified: Secondary | ICD-10-CM

## 2022-07-01 DIAGNOSIS — B009 Herpesviral infection, unspecified: Secondary | ICD-10-CM | POA: Diagnosis not present

## 2022-07-01 DIAGNOSIS — I10 Essential (primary) hypertension: Secondary | ICD-10-CM

## 2022-07-01 DIAGNOSIS — H579 Unspecified disorder of eye and adnexa: Secondary | ICD-10-CM

## 2022-07-01 DIAGNOSIS — Z0000 Encounter for general adult medical examination without abnormal findings: Secondary | ICD-10-CM | POA: Diagnosis not present

## 2022-07-01 MED ORDER — ONE-DAILY MULTI VITAMINS PO TABS
1.0000 | ORAL_TABLET | Freq: Every day | ORAL | 3 refills | Status: AC
  Filled 2022-07-01: qty 90, 90d supply, fill #0

## 2022-07-01 MED ORDER — SERTRALINE HCL 25 MG PO TABS
25.0000 mg | ORAL_TABLET | Freq: Every day | ORAL | 3 refills | Status: DC
  Filled 2022-07-01: qty 90, 90d supply, fill #0

## 2022-07-01 MED ORDER — AMLODIPINE BESYLATE-VALSARTAN 10-320 MG PO TABS
1.0000 | ORAL_TABLET | Freq: Every day | ORAL | 3 refills | Status: DC
  Filled 2022-07-01: qty 90, 90d supply, fill #0
  Filled 2023-01-13: qty 90, 90d supply, fill #1
  Filled 2023-01-14: qty 90, 90d supply, fill #0
  Filled 2023-01-14: qty 90, 90d supply, fill #1

## 2022-07-01 MED ORDER — POTASSIUM CHLORIDE CRYS ER 10 MEQ PO TBCR
10.0000 meq | EXTENDED_RELEASE_TABLET | Freq: Every day | ORAL | 3 refills | Status: DC
  Filled 2022-07-01: qty 90, 90d supply, fill #0
  Filled 2022-11-07: qty 90, 90d supply, fill #1

## 2022-07-01 MED ORDER — VALACYCLOVIR HCL 1 G PO TABS
1000.0000 mg | ORAL_TABLET | Freq: Every day | ORAL | 3 refills | Status: AC
  Filled 2022-07-01 – 2022-09-17 (×2): qty 90, 90d supply, fill #0
  Filled 2023-01-13: qty 90, 90d supply, fill #1
  Filled 2023-01-14: qty 90, 90d supply, fill #0
  Filled 2023-01-14: qty 90, 90d supply, fill #1

## 2022-07-01 MED ORDER — CHLORTHALIDONE 25 MG PO TABS
25.0000 mg | ORAL_TABLET | Freq: Every morning | ORAL | 3 refills | Status: DC
  Filled 2022-07-01: qty 90, 90d supply, fill #0
  Filled 2022-11-07: qty 90, 90d supply, fill #1

## 2022-07-01 MED ORDER — VITAMIN D (ERGOCALCIFEROL) 1.25 MG (50000 UNIT) PO CAPS
50000.0000 [IU] | ORAL_CAPSULE | ORAL | 0 refills | Status: DC
  Filled 2022-07-01: qty 12, 84d supply, fill #0

## 2022-07-01 NOTE — Progress Notes (Signed)
[place]  Phone: [phone]  New patient visit  Visit Date: 07/01/2022 Patient: Christine Eaton   DOB: April 05, 1961   62 y.o. Female  MRN: [id number] PCP:  Loralee Pacas, MD  (establishing care today)  [place] Provider: Loralee Pacas, MD   Assessment and Plan:   This initial visit focused on establishing a primary care relationship and gathering a comprehensive medical history. We addressed key concerns and prioritized next  steps. For any remaining concerns, we encourage Ms.Maples to schedule a follow-up appointment at her earliest convenience.   To ensure optimal health maintenance and a complete medical record, we also recommend scheduling an Annual Preventive Medicine and Wellness Visit follow up soon as well. Some of the baseline lab orders are preventive care aspects of today's visit.  Preventative health care -     Lipid panel; Future -     CBC; Future -     Comprehensive metabolic panel; Future  Screening examination for infectious disease -     Hepatitis C antibody; Future -     HIV Antibody (routine testing w rflx); Future  Work-related stress Assessment & Plan: Counseled on need to develop exit strategy.  [employer]. Consider shifting to another part of [place]. Advised patient not to work self to death trying to maintain performance. Celexa allergy was just weight gain and 1/20223 EKG reviewed and qtc 403.  Patients chart review and interview were used to generate a prompt for artificial intelligence analysis ([employer] artificial intelligence) clinical decision support.  AI output was reviewed and is provided in red: Execute Strategy Given the patient's history of weight gain with Celexa, it's prudent to choose an SSRI with a lower risk of weight gain. SSRIs, in general, can vary in their side effect profiles, including impacts on weight. Sertraline and escitalopram are considered to have a relatively neutral effect on  weight, making them suitable options to consider. However, the choice between SSRIs also needs to account for potential drug interactions and the patient's QTc interval. Sertraline is generally well-tolerated, has minimal impact on the QTc interval, and has a favorable profile regarding weight neutrality. Escitalopram, while also weight neutral for many patients, requires careful consideration due to its potential, albeit small, to impact the QTc interval. Systematically Ensure Accuracy & Precision Upon further analysis, considering the patient's obesity, hypertension, and the need to avoid exacerbating her QTc interval, sertraline stands out as a suitable option. It is effective for treating anxiety, has a lower risk of weight gain, and is less likely to interact adversely with her current medications. Additionally, sertraline's impact on the QTc interval is less pronounced compared to other SSRIs, aligning with the need to manage her anxiety without compromising her cardiovascular health. Final Answer Sertraline is recommended as the most suitable SSRI for this patient. It addresses her work-related anxiety while minimizing the risk of weight gain and avoiding significant QTc prolongation. This choice takes into account her complex medical history, her specific concerns regarding weight gain with previous SSRI use, and the need for a safe, effective treatment for her anxiety.  Orders: -     Sertraline HCl; Take 1 tablet (25 mg total) by mouth daily.  Dispense: 90 tablet; Refill: 3  Hypertension, unspecified type -     amLODIPine Besylate-Valsartan; Take 1 tablet by mouth daily.  Dispense: 90 tablet; Refill: 3 -     Chlorthalidone; Take 1 tablet (25 mg total) by mouth in the morning with food  Dispense:  90 tablet; Refill: 3  Vitamin D deficiency -     Vitamin D (Ergocalciferol); Take 1 capsule (50,000 Units total) by mouth every 7 (seven) days.  Dispense: 12 capsule; Refill: 0  Eye  problem  Nutrition disorder -     One-Daily Multi Vitamins; Take 1 tablet by mouth daily.  Dispense: 90 tablet; Refill: 3  Drug-induced hypokalemia -     Potassium Chloride Crys ER; Take 1 tablet (10 mEq total) by mouth daily with food  Dispense: 90 tablet; Refill: 3  HSV (herpes simplex virus) infection -     valACYclovir HCl; Take 1 tablet (1,000 mg total) by mouth daily.  Dispense: 90 tablet; Refill: 85     62 y.o. female New Patient (Initial Visit) and Anxiety (Work related )    Problem  Work-Related Stress   Works as Sport and exercise psychologist, they just hired 2 new supervisors, reducing her subordinates from 60 to 17 in the context of extensive layoffs at [employer].  They just layed off her manager. There is a lot of pressure and unclear expectations.  She has 35 years with [facility].        She has Hypertension; Plantar fasciitis; Fibroadenoma of right breast; Fibroids; Pelvic pain in female; Acute medial meniscus tear of left knee; Candidiasis of vagina; Chronic back pain; Ganglion of right hand; Herpes simplex; Obesity; Hypokalemia due to loss of potassium; Snoring; Morbid obesity ([place]); Encounter for counseling on use of CPAP; Severe obstructive sleep apnea-hypopnea syndrome; Hypokalemia; Chest pain; Prediabetes; Mass of foot; Pain in left foot; Hallux valgus; Pain in joint of left knee; and Work-related stress on their problem list. She  has a past medical history of Anxiety, Arthritis (May 2023), Back pain, Depression, Hypertension, Infertility, female, Knee pain, Mass of right hand (09/2015), Neck pain, and Sleep apnea (July 2020).  height is 5' 6.5" (1.689 m) and weight is 234 lb 6.4 oz (106.3 kg). Her temporal temperature is 98.2 F (36.8 C). Her blood pressure is 130/88 and her pulse is 62. Her oxygen saturation is 99%.   Medications: Vitamin D (Ergocalciferol), amLODipine-valsartan, chlorthalidone, ketorolac, multivitamin, potassium chloride, prednisoLONE acetate,  sertraline, and valACYclovir  Lab Results  Component Value Date   NA 139 04/29/2021   K 3.7 04/29/2021   CL 103 04/29/2021   CO2 26 04/29/2021   GLUCOSE 118 (H) 04/29/2021   BUN 9 04/29/2021   CREATININE 0.76 04/29/2021   CALCIUM 8.7 (L) 04/29/2021   PROT 6.6 04/28/2021   ALBUMIN 3.2 (L) 04/28/2021   AST 25 04/28/2021   ALT 24 04/28/2021   ALKPHOS 80 04/28/2021   BILITOT 0.6 04/28/2021   WBC 4.2 04/29/2021   HGB 13.2 04/29/2021   HCT 40.9 04/29/2021   MCV 81.3 04/29/2021   PLT 289 04/29/2021   CHOL 174 04/29/2021   HDL 67 04/29/2021   LDLCALC 94 04/29/2021   TRIG 63 04/29/2021   CHOLHDL 2.6 04/29/2021   Lab Results  Component Value Date   VITAMINB12 479 07/20/2019   TSH 0.671 07/20/2019   FREET4 1.34 07/20/2019   DDIMER 0.55 (H) 04/28/2021   HIV Non Reactive 04/28/2021     Subjective:   Ms.Kinsella presents today with intent to establish care with Loralee Pacas, MD as her Primary Care Provider (PCP) going forward.   Her main concern(s) / chief complaint(s) are New Patient (Initial Visit) and Anxiety (Work related )   HPI  Problem-oriented charting was used to develop and update her medical history: Problem  Work-Related  Stress   Works as Sport and exercise psychologist, they just hired 2 new supervisors, reducing her subordinates from 43 to 83 in the context of extensive layoffs at [employer].  They just layed off her manager. There is a lot of pressure and unclear expectations.  She has 35 years with [facility].         Depression Screen    07/01/2022    1:43 PM 07/20/2019    7:31 AM  PHQ 2/9 Scores  PHQ - 2 Score 4 2  PHQ- 9 Score 9 12   No results found for any visits on 07/01/22.  The following were reviewed and entered/updated into her MEDICAL RECORD [place] History:  Diagnosis Date   Anxiety    Arthritis May 2023   Back pain    Depression    Hypertension    states under control with meds., has been on med. > 15 yr.   Infertility,  female    Knee pain    Mass of right hand 09/2015   Neck pain    Sleep apnea July 2020   Past Surgical History:  Procedure Laterality Date   ABDOMINAL HYSTERECTOMY  In 2008   BREAST EXCISIONAL BIOPSY Right 2005   BREAST MASS EXCISION Right 08/23/2003   BREAST SURGERY  In 1996   Biopsy   CHROMOPERTUBATION  04/15/2001   COLONOSCOPY  06/25/2011   Procedure: COLONOSCOPY;  Surgeon: Garlan Fair, MD;  Location: [facility];  Service: Endoscopy;  Laterality: N/A;  MAC   COLONOSCOPY  2023   EXCISION OF ENDOMETRIOMA Right 04/15/2001   right tube   EYE SURGERY Right June 02, 2022   HERNIA REPAIR  In 1989   INGUINAL HERNIA REPAIR Left    KNEE ARTHROSCOPY  02/02/2012   Procedure: ARTHROSCOPY KNEE;  Surgeon: Lorn Junes, MD;  Location: [facility];  Service: Orthopedics;  Laterality: Left;  ARTHROSCOPY KNEE WITH DEBRIDEMENT/SHAVING (CHONDROPLASTY)   LAPAROSCOPIC LYSIS OF ADHESIONS  04/15/2001; 05/24/2007   MASS EXCISION Right 10/02/2015   Procedure: EXCISION OF RIGHT HAND  MASS;  Surgeon: Charlotte Crumb, MD;  Location: [facility];  Service: Orthopedics;  Laterality: Right;   REPAIR VAGINAL CUFF  06/18/2007   ROBOTIC ASSISTED TOTAL HYSTERECTOMY WITH BILATERAL SALPINGO OOPHERECTOMY  05/24/2007   TOE SURGERY Left 2021   great toe   Family History  Problem Relation Age of Onset   Hypertension Mother    Cancer Mother        OVARIAN   Obesity Mother    Diabetes Father    Cancer Father        LUNG   Hypertension Father    Obesity Father    Hypertension Maternal Aunt    Hypertension Maternal Uncle    Hypertension Maternal Aunt    Miscarriages / Stillbirths Maternal Aunt    Colon cancer Neg Hx    Breast cancer Neg Hx    Outpatient Medications Prior to Visit  Medication Sig Dispense Refill   ketorolac (ACULAR) 0.5 % ophthalmic solution Place 1 drop into the right eye 3 (three) times daily. 5 mL 3   prednisoLONE acetate (PRED FORTE) 1 %  ophthalmic suspension Place 1 drop into the right eye 4  times daily FOR 1 WEEK, then 1 drop, 3 times a day x 1week, THEN 1 drop, twice a day x 1week, THEN 1 drop daily x 1week. 10 mL 0   amLODipine-valsartan (EXFORGE) 10-320 MG tablet Take 1 tablet by mouth daily. [place]  tablet 3   chlorthalidone (HYGROTON) 25 MG tablet Take 1 tablet (25 mg total) by mouth in the morning with food 90 tablet 3   Cholecalciferol (VITAMIN D-3) 125 MCG (5000 UT) TABS Take 1 tablet by mouth daily.     Multiple Vitamin (MULTIVITAMIN) tablet Take 1 tablet by mouth daily.     ofloxacin (OCUFLOX) 0.3 % ophthalmic solution Place 1 drop into the right eye 4 (four) times daily for 1 week. 5 mL 0   potassium chloride (KLOR-CON M) 10 MEQ tablet Take 1 tablet (10 mEq total) by mouth daily with food 90 tablet 3   valACYclovir (VALTREX) 1000 MG tablet Take 1 tablet (1,000 mg total) by mouth daily. 90 tablet 3   metFORMIN (GLUCOPHAGE) 500 MG tablet Take 1 tablet (500 mg total) by mouth daily with a meal 30 tablet 0   methocarbamol (ROBAXIN) 500 MG tablet Take 1 tablet (500 mg total) by mouth 2 (two) times daily. 20 tablet 0   naproxen (NAPROSYN) 375 MG tablet Take 1 tablet (375 mg total) by mouth 2 (two) times daily. (Patient not taking: Reported on 07/01/2022) 20 tablet 0   No facility-administered medications prior to visit.    Allergies  Allergen Reactions   Lavender Oil Hives   Lisinopril Cough   Social History   Tobacco Use   Smoking status: Former    Packs/day: 0.50    Years: 7.00    Additional pack years: 0.00    Total pack years: 3.50    Types: Cigarettes    Quit date: 12/22/2015    Years since quitting: 6.5   Smokeless tobacco: Never  Vaping Use   Vaping Use: Never used  Substance Use Topics   Alcohol use: Yes    Alcohol/week: 2.0 - 3.0 standard drinks of alcohol    Types: 2 - 3 Glasses of wine per week    Comment: occasionally   Drug use: Never    Immunization History  Administered Date(s) Administered    Influenza-Unspecified 12/24/2016, 12/22/2017   Pfizer Covid-19 Vaccine Bivalent Booster 62yrs & up 01/24/2021   Tdap 02/18/2018   Unspecified SARS-COV-2 Vaccination 06/06/2019, 07/07/2019    Objective:  Physical Exam  BP 130/88 (BP Location: Left Arm, Patient Position: Sitting)   Pulse 62   Temp 98.2 F (36.8 C) (Temporal)   Ht 5' 6.5" (1.689 m)   Wt 234 lb 6.4 oz (106.3 kg)   SpO2 99%   BMI 37.27 kg/m  Wt Readings from Last 10 Encounters:  07/01/22 234 lb 6.4 oz (106.3 kg)  05/14/22 237 lb (107.5 kg)  02/15/22 236 lb (107 kg)  05/08/21 250 lb (113.4 kg)  04/29/21 238 lb (108 kg)  11/05/20 238 lb (108 kg)  04/17/20 244 lb (110.7 kg)  10/16/19 230 lb (104.3 kg)  09/20/19 233 lb (105.7 kg)  08/24/19 232 lb (105.2 kg)   Vital signs reviewed.  Nursing notes reviewed. Weight trend reviewed. Abnormalities and Problem-Specific physical exam findings:  stressed, anxious, intelligent. General Appearance:  Well developed, well nourished, well-groomed, healthy-appearing female with Body mass index is 37.27 kg/m.Marland Kitchen No acute distress appreciable.   Skin: Clear and well-hydrated. Pulmonary:  Normal work of breathing at rest, no respiratory distress apparent. SpO2: 99 %  Musculoskeletal:  Patient demonstrates smooth and coordinated movements throughout all major joints. All extremities are intact.  Neurological:  Awake, alert, oriented, and engaged.  No obvious focal neurological deficits or cognitive impairments.  Sensorium seems unclouded. Gait is smooth and coordinated Psychiatric:  Appropriate mood, pleasant and cooperative demeanor, cheerful and engaged during the exam  Results Reviewed (reviewed labs/imaging may be also be found in the assessment / plan section):    Results for orders placed or performed during the hospital encounter of 04/28/21  Resp Panel by RT-PCR (Flu A&B, Covid) Nasopharyngeal Swab   Specimen: Nasopharyngeal Swab; Nasopharyngeal(NP) swabs in vial transport  medium  Result Value Ref Range   SARS Coronavirus 2 by RT PCR NEGATIVE NEGATIVE   Influenza A by PCR NEGATIVE NEGATIVE   Influenza B by PCR NEGATIVE NEGATIVE  D-dimer, quantitative  Result Value Ref Range   D-Dimer, Quant 0.55 (H) 0.00 - 0.50 ug/mL-FEU  Comprehensive metabolic panel  Result Value Ref Range   Sodium 138 135 - 145 mmol/L   Potassium 3.2 (L) 3.5 - 5.1 mmol/L   Chloride 102 98 - 111 mmol/L   CO2 25 22 - 32 mmol/L   Glucose, Bld 138 (H) 70 - 99 mg/dL   BUN 11 6 - 20 mg/dL   Creatinine, Ser 0.71 0.44 - 1.00 mg/dL   Calcium 9.0 8.9 - 10.3 mg/dL   Total Protein 6.6 6.5 - 8.1 g/dL   Albumin 3.2 (L) 3.5 - 5.0 g/dL   AST 25 15 - 41 U/L   ALT 24 0 - 44 U/L   Alkaline Phosphatase 80 38 - 126 U/L   Total Bilirubin 0.6 0.3 - 1.2 mg/dL   GFR, Estimated >60 >60 mL/min   Anion gap 11 5 - 15  CBC with Differential  Result Value Ref Range   WBC 5.2 4.0 - 10.5 K/uL   RBC 5.05 3.87 - 5.11 MIL/uL   Hemoglobin 13.2 12.0 - 15.0 g/dL   HCT 40.8 36.0 - 46.0 %   MCV 80.8 80.0 - 100.0 fL   MCH 26.1 26.0 - 34.0 pg   MCHC 32.4 30.0 - 36.0 g/dL   RDW 14.6 11.5 - 15.5 %   Platelets 283 150 - 400 K/uL   nRBC 0.0 0.0 - 0.2 %   Neutrophils Relative % 75 %   Neutro Abs 3.9 1.7 - 7.7 K/uL   Lymphocytes Relative 13 %   Lymphs Abs 0.7 0.7 - 4.0 K/uL   Monocytes Relative 9 %   Monocytes Absolute 0.5 0.1 - 1.0 K/uL   Eosinophils Relative 3 %   Eosinophils Absolute 0.2 0.0 - 0.5 K/uL   Basophils Relative 0 %   Basophils Absolute 0.0 0.0 - 0.1 K/uL   Immature Granulocytes 0 %   Abs Immature Granulocytes 0.02 0.00 - 0.07 K/uL  Magnesium  Result Value Ref Range   Magnesium 2.1 1.7 - 2.4 mg/dL  Brain natriuretic peptide  Result Value Ref Range   B Natriuretic Peptide 61.4 0.0 - 100.0 pg/mL  Heparin level (unfractionated)  Result Value Ref Range   Heparin Unfractionated 0.54 0.30 - 0.70 IU/mL  HIV Antibody (routine testing w rflx)  Result Value Ref Range   HIV Screen 4th Generation wRfx  Non Reactive Non Reactive  CBC  Result Value Ref Range   WBC 4.2 4.0 - 10.5 K/uL   RBC 5.03 3.87 - 5.11 MIL/uL   Hemoglobin 13.2 12.0 - 15.0 g/dL   HCT 40.9 36.0 - 46.0 %   MCV 81.3 80.0 - 100.0 fL   MCH 26.2 26.0 - 34.0 pg   MCHC 32.3 30.0 - 36.0 g/dL   RDW 14.8 11.5 - 15.5 %   Platelets 289 150 - 400 K/uL   nRBC 0.0 0.0 -  0.2 %  Lipid panel  Result Value Ref Range   Cholesterol 174 0 - 200 mg/dL   Triglycerides 63 <150 mg/dL   HDL 67 >40 mg/dL   Total CHOL/HDL Ratio 2.6 RATIO   VLDL 13 0 - 40 mg/dL   LDL Cholesterol 94 0 - 99 mg/dL  Basic metabolic panel  Result Value Ref Range   Sodium 139 135 - 145 mmol/L   Potassium 3.7 3.5 - 5.1 mmol/L   Chloride 103 98 - 111 mmol/L   CO2 26 22 - 32 mmol/L   Glucose, Bld 118 (H) 70 - 99 mg/dL   BUN 9 6 - 20 mg/dL   Creatinine, Ser 0.76 0.44 - 1.00 mg/dL   Calcium 8.7 (L) 8.9 - 10.3 mg/dL   GFR, Estimated >60 >60 mL/min   Anion gap 10 5 - 15  Heparin level (unfractionated)  Result Value Ref Range   Heparin Unfractionated 0.22 (L) 0.30 - 0.70 IU/mL  ECHOCARDIOGRAM COMPLETE  Result Value Ref Range   BP 114/73 mmHg   Single Plane A2C EF 60.4 %   Single Plane A4C EF 63.9 %   Calc EF 63.2 %   S' Lateral 2.40 cm   Area-P 1/2 3.85 cm2  Troponin I (High Sensitivity)  Result Value Ref Range   Troponin I (High Sensitivity) 8 <18 ng/L  Troponin I (High Sensitivity)  Result Value Ref Range   Troponin I (High Sensitivity) 22 (H) <18 ng/L  Troponin I (High Sensitivity)  Result Value Ref Range   Troponin I (High Sensitivity) 25 (H) <18 ng/L

## 2022-07-01 NOTE — Assessment & Plan Note (Addendum)
Counseled on need to develop exit strategy.  [employer]. Consider shifting to another part of [place]. Advised patient not to work self to death trying to maintain performance. Celexa allergy was just weight gain and 1/20223 EKG reviewed and qtc 403.  Patients chart review and interview were used to generate a prompt for artificial intelligence analysis ([facility] artificial intelligence) clinical decision support.  AI output was reviewed and is provided in red: Execute Strategy Given the patient's history of weight gain with Celexa, it's prudent to choose an SSRI with a lower risk of weight gain. SSRIs, in general, can vary in their side effect profiles, including impacts on weight. Sertraline and escitalopram are considered to have a relatively neutral effect on weight, making them suitable options to consider. However, the choice between SSRIs also needs to account for potential drug interactions and the patient's QTc interval. Sertraline is generally well-tolerated, has minimal impact on the QTc interval, and has a favorable profile regarding weight neutrality. Escitalopram, while also weight neutral for many patients, requires careful consideration due to its potential, albeit small, to impact the QTc interval. Systematically Ensure Accuracy & Precision Upon further analysis, considering the patient's obesity, hypertension, and the need to avoid exacerbating her QTc interval, sertraline stands out as a suitable option. It is effective for treating anxiety, has a lower risk of weight gain, and is less likely to interact adversely with her current medications. Additionally, sertraline's impact on the QTc interval is less pronounced compared to other SSRIs, aligning with the need to manage her anxiety without compromising her cardiovascular health. Final Answer Sertraline is recommended as the most suitable SSRI for this patient. It addresses her work-related anxiety while minimizing the risk of  weight gain and avoiding significant QTc prolongation. This choice takes into account her complex medical history, her specific concerns regarding weight gain with previous SSRI use, and the need for a safe, effective treatment for her anxiety.

## 2022-07-02 ENCOUNTER — Other Ambulatory Visit: Payer: Self-pay

## 2022-07-02 LAB — CBC
HCT: 41.1 % (ref 36.0–46.0)
Hemoglobin: 13.8 g/dL (ref 12.0–15.0)
MCHC: 33.6 g/dL (ref 30.0–36.0)
MCV: 79.4 fl (ref 78.0–100.0)
Platelets: 372 10*3/uL (ref 150.0–400.0)
RBC: 5.17 Mil/uL — ABNORMAL HIGH (ref 3.87–5.11)
RDW: 14.7 % (ref 11.5–15.5)
WBC: 4.9 10*3/uL (ref 4.0–10.5)

## 2022-07-02 LAB — LIPID PANEL
Cholesterol: 187 mg/dL (ref 0–200)
HDL: 78.1 mg/dL (ref >39.00)
LDL Cholesterol: 100 mg/dL — ABNORMAL HIGH (ref 0–99)
NonHDL: 109.09
Total CHOL/HDL Ratio: 2
Triglycerides: 47 mg/dL (ref 0.0–149.0)
VLDL: 9.4 mg/dL (ref 0.0–40.0)

## 2022-07-02 LAB — HEPATITIS C ANTIBODY: Hepatitis C Ab: NONREACTIVE

## 2022-07-02 LAB — COMPREHENSIVE METABOLIC PANEL
ALT: 14 U/L (ref 0–35)
AST: 17 U/L (ref 0–37)
Albumin: 4.2 g/dL (ref 3.5–5.2)
Alkaline Phosphatase: 83 U/L (ref 39–117)
BUN: 11 mg/dL (ref 6–23)
CO2: 31 mEq/L (ref 19–32)
Calcium: 9.7 mg/dL (ref 8.4–10.5)
Chloride: 98 mEq/L (ref 96–112)
Creatinine, Ser: 0.66 mg/dL (ref 0.40–1.20)
GFR: 94.83 mL/min (ref >60.00)
Glucose, Bld: 102 mg/dL — ABNORMAL HIGH (ref 70–99)
Potassium: 3.2 mEq/L — ABNORMAL LOW (ref 3.5–5.1)
Sodium: 138 mEq/L (ref 135–145)
Total Bilirubin: 0.7 mg/dL (ref 0.2–1.2)
Total Protein: 7.1 g/dL (ref 6.0–8.3)

## 2022-07-02 LAB — HIV ANTIBODY (ROUTINE TESTING W REFLEX): HIV 1&2 Ab, 4th Generation: NONREACTIVE

## 2022-07-05 ENCOUNTER — Encounter: Payer: Self-pay | Admitting: Internal Medicine

## 2022-07-05 DIAGNOSIS — E785 Hyperlipidemia, unspecified: Secondary | ICD-10-CM | POA: Insufficient documentation

## 2022-07-05 DIAGNOSIS — Z86010 Personal history of colonic polyps: Secondary | ICD-10-CM | POA: Insufficient documentation

## 2022-07-05 NOTE — Progress Notes (Signed)
Please place any orders needed / requested. Arrange/confirm follow up appt(s).

## 2022-07-07 ENCOUNTER — Other Ambulatory Visit (HOSPITAL_COMMUNITY): Payer: Self-pay

## 2022-07-09 ENCOUNTER — Other Ambulatory Visit (HOSPITAL_COMMUNITY): Payer: Self-pay

## 2022-07-09 ENCOUNTER — Other Ambulatory Visit: Payer: Self-pay

## 2022-07-09 DIAGNOSIS — R7303 Prediabetes: Secondary | ICD-10-CM

## 2022-07-09 DIAGNOSIS — E7800 Pure hypercholesterolemia, unspecified: Secondary | ICD-10-CM

## 2022-07-09 DIAGNOSIS — I10 Essential (primary) hypertension: Secondary | ICD-10-CM

## 2022-07-09 MED ORDER — ROSUVASTATIN CALCIUM 10 MG PO TABS
10.0000 mg | ORAL_TABLET | Freq: Every day | ORAL | 3 refills | Status: DC
  Filled 2022-07-09: qty 90, 90d supply, fill #0

## 2022-07-21 DIAGNOSIS — G4733 Obstructive sleep apnea (adult) (pediatric): Secondary | ICD-10-CM | POA: Diagnosis not present

## 2022-07-23 ENCOUNTER — Encounter: Payer: Self-pay | Admitting: Internal Medicine

## 2022-07-28 ENCOUNTER — Other Ambulatory Visit: Payer: Self-pay | Admitting: Internal Medicine

## 2022-07-28 ENCOUNTER — Other Ambulatory Visit (HOSPITAL_COMMUNITY): Payer: Self-pay

## 2022-07-28 MED ORDER — FUROSEMIDE 40 MG PO TABS
40.0000 mg | ORAL_TABLET | Freq: Every day | ORAL | 3 refills | Status: DC
  Filled 2022-07-28: qty 30, 30d supply, fill #0

## 2022-08-03 DIAGNOSIS — H3581 Retinal edema: Secondary | ICD-10-CM | POA: Diagnosis not present

## 2022-08-03 DIAGNOSIS — H3561 Retinal hemorrhage, right eye: Secondary | ICD-10-CM | POA: Diagnosis not present

## 2022-08-03 DIAGNOSIS — H35351 Cystoid macular degeneration, right eye: Secondary | ICD-10-CM | POA: Diagnosis not present

## 2022-08-03 DIAGNOSIS — H34831 Tributary (branch) retinal vein occlusion, right eye, with macular edema: Secondary | ICD-10-CM | POA: Diagnosis not present

## 2022-08-13 ENCOUNTER — Other Ambulatory Visit (HOSPITAL_COMMUNITY): Payer: Self-pay

## 2022-08-20 DIAGNOSIS — G4733 Obstructive sleep apnea (adult) (pediatric): Secondary | ICD-10-CM | POA: Diagnosis not present

## 2022-09-17 ENCOUNTER — Other Ambulatory Visit (HOSPITAL_COMMUNITY): Payer: Self-pay

## 2022-09-18 ENCOUNTER — Other Ambulatory Visit (HOSPITAL_COMMUNITY): Payer: Self-pay

## 2022-09-20 DIAGNOSIS — G4733 Obstructive sleep apnea (adult) (pediatric): Secondary | ICD-10-CM | POA: Diagnosis not present

## 2022-09-23 ENCOUNTER — Encounter: Payer: Self-pay | Admitting: Internal Medicine

## 2022-09-23 ENCOUNTER — Ambulatory Visit (INDEPENDENT_AMBULATORY_CARE_PROVIDER_SITE_OTHER): Payer: 59 | Admitting: Internal Medicine

## 2022-09-23 ENCOUNTER — Other Ambulatory Visit (HOSPITAL_COMMUNITY): Payer: Self-pay

## 2022-09-23 VITALS — BP 94/62 | HR 69 | Temp 98.1°F | Ht 66.5 in | Wt 231.8 lb

## 2022-09-23 DIAGNOSIS — I10 Essential (primary) hypertension: Secondary | ICD-10-CM | POA: Diagnosis not present

## 2022-09-23 DIAGNOSIS — E7800 Pure hypercholesterolemia, unspecified: Secondary | ICD-10-CM

## 2022-09-23 DIAGNOSIS — E782 Mixed hyperlipidemia: Secondary | ICD-10-CM | POA: Diagnosis not present

## 2022-09-23 DIAGNOSIS — Z86010 Personal history of colonic polyps: Secondary | ICD-10-CM | POA: Diagnosis not present

## 2022-09-23 DIAGNOSIS — R7303 Prediabetes: Secondary | ICD-10-CM | POA: Diagnosis not present

## 2022-09-23 DIAGNOSIS — R7309 Other abnormal glucose: Secondary | ICD-10-CM | POA: Diagnosis not present

## 2022-09-23 DIAGNOSIS — Z566 Other physical and mental strain related to work: Secondary | ICD-10-CM

## 2022-09-23 DIAGNOSIS — Z0000 Encounter for general adult medical examination without abnormal findings: Secondary | ICD-10-CM

## 2022-09-23 LAB — POCT GLYCOSYLATED HEMOGLOBIN (HGB A1C): Hemoglobin A1C: 6 % — AB (ref 4.0–5.6)

## 2022-09-23 MED ORDER — FLUOXETINE HCL 20 MG PO CAPS
20.0000 mg | ORAL_CAPSULE | Freq: Every day | ORAL | 3 refills | Status: DC
  Filled 2022-09-23 – 2022-10-27 (×3): qty 90, 90d supply, fill #0

## 2022-09-23 MED ORDER — ARIPIPRAZOLE 2 MG PO TABS
2.0000 mg | ORAL_TABLET | Freq: Every day | ORAL | 2 refills | Status: AC
  Filled 2022-09-23 – 2022-10-27 (×3): qty 30, 30d supply, fill #0

## 2022-09-23 MED ORDER — ROSUVASTATIN CALCIUM 10 MG PO TABS
10.0000 mg | ORAL_TABLET | Freq: Every day | ORAL | 3 refills | Status: AC
  Filled 2022-09-23 – 2022-10-27 (×3): qty 90, 90d supply, fill #0

## 2022-09-23 NOTE — Assessment & Plan Note (Signed)
Prediabetes: The last A1C at 6.4 is borderline for a diabetes diagnosis. We will check A1C via finger stick today to evaluate the current status.

## 2022-09-23 NOTE — Assessment & Plan Note (Signed)
General Health Maintenance: She should continue using CPAP for sleep apnea and is encouraged to exercise regularly for mood improvement and weight loss. A follow-up in one month will help assess the response to new medications and overall health status.

## 2022-09-23 NOTE — Patient Instructions (Addendum)
It was a pleasure seeing you today! Your health and satisfaction are our top priorities.   Glenetta Hew, MD  VISIT SUMMARY:  During your visit, we discussed your worsening depression and anxiety, which have been exacerbated by work-related stress. We also reviewed your history of prediabetes and borderline hypertension. You mentioned a past issue with an ingrown toenail, which is currently stable. You expressed a desire to improve your overall health and lose weight.  YOUR PLAN:  -DEPRESSION AND ANXIETY: We will start you on Prozac and Abilify to manage your symptoms. Prozac may take 1-2 months to show effects, while Abilify should help reduce your anxiety quickly.  -HYPERLIPIDEMIA: Your LDL is slightly elevated, increasing your risk of heart attack and stroke. We will start you on Rosuvastatin to help reduce this risk.  -PREDIABETES: Your last A1C was 6.4, which is borderline for a diabetes diagnosis. We will check your A1C today to see if there have been any changes.  -HYPOTENSION: Your blood pressure is low despite your current medications. If this continues, we may consider reducing the dose of these medications chlorthalidone and furosemide by half.  -GENERAL HEALTH MAINTENANCE: Continue using your CPAP machine for sleep apnea and try to exercise regularly. This will help improve your mood and assist with weight loss.  INSTRUCTIONS:  Please start taking the prescribed medications and continue with your regular exercise. Monitor your blood pressure at home and report any significant changes. We will follow up in one month to assess your response to the new medications and your overall health status. Next Steps:  [x]  Early Intervention: Schedule sooner appointment, call our on-call services, or go to emergency room if there is Increase in pain or discomfort New or worsening symptoms Sudden or severe changes in your health [x]  Flexible Follow-Up: We recommend a No follow-ups on file.  for optimal routine care. This allows for progress monitoring and treatment adjustments. [x]  Preventive Care: Schedule your annual preventive care visit! It's typically covered by insurance and helps identify potential health issues early. [x]  Lab & X-ray Appointments: Incomplete tests scheduled today, or call to schedule. X-rays: [facility] (M-F, 8:30am-noon or 1pm-5pm). [x]  Medical Information Release: Sign a release form at front desk to obtain relevant medical information we don't have.  Making the Most of Our Focused (20 minute) Appointments:  [x]   Clearly state your top concerns at the beginning of the visit to focus our discussion [x]   If you anticipate you will need more time, please inform the front desk during scheduling - we can book multiple appointments in the same week. [x]   If you have transportation problems- use our convenient video appointments or ask about transportation support. [x]   We can get down to business faster if you use MyChart to update information before the visit and submit non-urgent questions before your visit. Thank you for taking the time to provide details through MyChart.  Let our nurse know and she can import this information into your encounter documents.  Arrival and Wait Times: [x]   Arriving on time ensures that everyone receives prompt attention. [x]   Early morning (8a) and afternoon (1p) appointments tend to have shortest wait times. [x]   Unfortunately, we cannot delay appointments for late arrivals or hold slots during phone calls.  Getting Answers and Following Up  [x]   Simple Questions & Concerns: For quick questions or basic follow-up after your visit, reach [place] at [phone] or MyChart messaging. [x]   Complex Concerns: If your concern is more  complex, scheduling an appointment might be best. Discuss this with the staff to find the most suitable option. [x]   Lab & Imaging Results: We'll contact you directly if results are  abnormal or you don't use MyChart. Most normal results will be on MyChart within 2-3 business days, with a review message from Dr. Jon Billings. Haven't heard back in 2 weeks? Need results sooner? Contact us at [phone]. [x]   Referrals: Our referral coordinator will manage specialist referrals. The specialist's office should contact you within 2 weeks to schedule an appointment. Call us if you haven't heard from them after 2 weeks.  Staying Connected  [x]   MyChart: Activate your MyChart for the fastest way to access results and message [place]. See the last page of this paperwork for instructions on how to activate.  Bring to Your Next Appointment  [x]   Medications: Please bring all your medication bottles to your next appointment to ensure we have an accurate record of your prescriptions. [x]   Health Diaries: If you're monitoring any health conditions at home, keeping a diary of your readings can be very helpful for discussions at your next appointment.  Billing  [x]   X-ray & Lab Orders: These are billed by separate companies. Contact the invoicing company directly for questions or concerns. [x]   Visit Charges: Discuss any billing inquiries with our administrative services team.  Your Satisfaction Matters  [x]   Share Your Experience: We strive for your satisfaction! If you have any complaints, or preferably compliments, please let Dr. Jon Billings know directly or contact our Practice Administrators, Edwena Felty or [employer], by asking at the front desk.   Reviewing Your Records  [x]   Review this early draft of your clinical encounter notes below and the final encounter summary tomorrow on MyChart after its been completed.   Work-related stress  Hypertension, unspecified type Assessment & Plan: Goal 140/90 Ok to reduce chlorthalidone and lasix by half   Prediabetes  History of colon polyps  Elevated cholesterol

## 2022-09-23 NOTE — Assessment & Plan Note (Addendum)
Hyperlipidemia: With LDL slightly elevated, excellent HDL, and acceptable total cholesterol, the 10-year heart attack risk stands at 11.5%. To reduce the risk of heart attack and stroke, especially considering high stress levels, we will start Rosuvastatin.Reordered rosuvastatin

## 2022-09-23 NOTE — Assessment & Plan Note (Addendum)
Goal 140/90 Ok to reduce chlorthalidone and lasix by half  Hypotension: Blood pressure remains low despite antihypertensive medications. We may consider reducing the dose of these medications if low blood pressure continues at home.

## 2022-09-23 NOTE — Assessment & Plan Note (Signed)
Depression and Anxiety: Due to worsening symptoms from work-related stress and an ineffective trial of Zoloft that increased anxiety, we will start Prozac and Abilify. Prozac's effects may manifest in 1-2 months, while Abilify is expected to rapidly alleviate anxiety. A follow-up in one month will assess the response to these medications.

## 2022-09-23 NOTE — Progress Notes (Signed)
[facility]: [phone]   Routine Medical Office Visit  Patient:  Christine Eaton      Age: 62 y.o.       Sex:  female  Date:   09/23/2022 PCP:    Lula Olszewski, MD   Today's Healthcare Provider: Lula Olszewski, MD   Assessment and Plan:   Navdeep was seen today for 2-3 month follow-up and depression.  Work-related stress Overview: Works as Restaurant manager, fast food, they just hired 2 new supervisors, reducing her subordinates from 50 to 16 in the context of extensive layoffs at [employer].  They just layed off her manager. There is a lot of pressure and unclear expectations.  She has 35 years with [employer].     09/23/2022    3:13 PM 07/01/2022    1:43 PM 07/20/2019    7:31 AM  Depression screen PHQ 2/9  Decreased Interest 2 2 1   Down, Depressed, Hopeless 2 2 1   PHQ - 2 Score 4 4 2   Altered sleeping 2 1 2   Tired, decreased energy 2 1 2   Change in appetite 3 1 1   Feeling bad or failure about yourself  2 1 2   Trouble concentrating 3 1 2   Moving slowly or fidgety/restless 0 0 1  Suicidal thoughts 0 0 0  PHQ-9 Score 16 9 12   Difficult doing work/chores Somewhat difficult Somewhat difficult Somewhat difficult       Orders: -     FLUoxetine HCl; Take 1 capsule (20 mg total) by mouth daily.  Dispense: 90 capsule; Refill: 3 -     ARIPiprazole; Take 1 tablet (2 mg total) by mouth daily.  Dispense: 30 tablet; Refill: 2  Hypertension, unspecified type Overview: Interim history from September 23, 2022:    Home readings: not checking Patient reports taking current medications consistently and not experiencing any significant associated side effects or symptoms. Current hypertension medications:       Sig   amLODipine-valsartan (EXFORGE) 10-320 MG tablet (Taking) Take 1 tablet by mouth daily.   chlorthalidone (HYGROTON) 25 MG tablet (Taking) Take 1 tablet (25 mg total) by mouth in the morning with food   furosemide (LASIX) 40 MG tablet (Taking)  Take 1 tablet (40 mg total) by mouth daily.      Lab Results  Component Value Date   NA 138 07/01/2022   K 3.2 (L) 07/01/2022   CREATININE 0.66 07/01/2022   GFR 94.83 07/01/2022     Assessment & Plan: Goal 140/90 Ok to reduce chlorthalidone and lasix by half   Prediabetes Overview: No results found for: "HGBA1C"    Orders: -     POCT glycosylated hemoglobin (Hb A1C)  History of colon polyps Overview: 07/02/22 plan repeat 06/2025 Gastroenterologist: No care team member to display    LG Intestine-Cecum, Biopsy: TUBULAR ADENOMA.  LG Intestine-Descending Colon, Biopsy: TUBULAR ADENOMAS (2). HYPERPLASTIC POLYPS (2).  LG Intestine, Recto-sigmoid. Biopsy: HYPERPLASTIC POLYP. POLYPOID COLONIC MUCOSA, NEGATIVE FOR DYSPLASIA.   Elevated cholesterol -     Rosuvastatin Calcium; Take 1 tablet (10 mg total) by mouth daily.  Dispense: 90 tablet; Refill: 3  Mixed hyperlipidemia Overview: Statin myopathy history: no Current Regimen: Diet and exercise   Lab Results  Component Value Date   CHOL 187 07/01/2022   CHOL 174 04/29/2021   CHOL 189 07/20/2019   Lab Results  Component Value Date   HDL 78.10 07/01/2022   HDL 67 04/29/2021   HDL 65 07/20/2019   Lab Results  Component Value Date   LDLCALC 100 (H) 07/01/2022   LDLCALC 94 04/29/2021   LDLCALC 115 (H) 07/20/2019   Lab Results  Component Value Date   TRIG 47.0 07/01/2022   TRIG 63 04/29/2021   TRIG 48 07/20/2019   Lab Results  Component Value Date   CHOLHDL 2 07/01/2022   CHOLHDL 2.6 04/29/2021   CHOLHDL 2.9 07/20/2019   No results found for: "LDLDIRECT"  The 10-year ASCVD risk score (Arnett DK, et al., 2019) is: 11.5%   Values used to calculate the score:     Age: 77 years     Sex: Female     Is Non-Hispanic African American: Yes     Diabetic: No     Tobacco smoker: Yes     Systolic Blood Pressure: 130 mmHg     Is BP treated: Yes     HDL Cholesterol: 78.1 mg/dL     Total Cholesterol: 187  mg/dL   Assessment & Plan: Reordered rosuvastatin   Elevated glucose level -     POCT glycosylated hemoglobin (Hb A1C)           Clinical Presentation:    62 y.o. female here today for 2-3 month follow-up and Depression (Seems to have worsened and medication did not help at all.)  AI-Extracted: Discussed the use of AI scribe software for clinical note transcription with the patient, who gave verbal consent to proceed.  History of Present Illness   The patient, a long-term employee in a phlebotomy department, presented for a 2-3 month follow-up visit primarily for worsening depression and anxiety. The patient reported that her mental health issues have been exacerbated by work-related stress, leading to her decision to retire early after 35 years of service. The patient described the workplace as increasingly stressful and impersonal, with high turnover and a lack of empathy from administration.  Previously, the patient had tried Zoloft for a month to manage her symptoms, but discontinued it due to worsening anxiety. She reported difficulty in reaching out for medical assistance during this period. The patient also mentioned a past issue with excessive Vitamin D intake, which has since been resolved without any side effects.  The patient is planning to retire in August, but is considering seeking alternative employment as she is not yet of retirement age. She expressed significant distress about the upcoming changes and the current work environment, contributing to her elevated depression and anxiety scores.  The patient has a history of prediabetes, with her last recorded A1C at 6.4, and is borderline hypertensive. She also reported a history of plantar fasciitis, a ganglion cyst, and fibroids, all of which have been resolved. She has been using a CPAP machine for sleep apnea and reported no current issues with snoring.  The patient also mentioned a past issue with an ingrown toenail on  her left foot, which required multiple surgeries and has caused ongoing issues. However, at the time of the consultation, she reported that this issue was stable.  In terms of lifestyle, the patient reported engaging in some form of exercise, specifically muscle building, in the mornings. She also expressed a desire to lose weight and improve her overall health.        Reviewed chart data: Active Ambulatory Problems    Diagnosis Date Noted   Hypertension    Fibroadenoma of right breast    Acute medial meniscus tear of left knee    Chronic back pain 08/12/2017   Herpes simplex 08/12/2017   Obesity 08/12/2017  Hypokalemia due to loss of potassium 08/23/2019   Morbid obesity (HCC) 08/23/2019   Severe obstructive sleep apnea-hypopnea syndrome 04/17/2020   Prediabetes 04/28/2021   Mass of foot 11/04/2021   Pain in joint of left knee 08/28/2021   Work-related stress 07/01/2022   History of colon polyps 07/05/2022   Hyperlipidemia 07/05/2022   Resolved Ambulatory Problems    Diagnosis Date Noted   Plantar fasciitis    Fibroids    Pelvic pain in female    Candidiasis of vagina 08/12/2017   Ganglion of right hand 05/21/2015   Snoring 08/23/2019   Chest pain 04/28/2021   Pain in left foot 10/28/2021   Hallux valgus 11/04/2021   Past Medical History:  Diagnosis Date   Anxiety    Arthritis May 2023   Back pain    Depression    Infertility, female    Knee pain    Mass of right hand 09/2015   Neck pain    Sleep apnea July 2020    Outpatient Medications Prior to Visit  Medication Sig   amLODipine-valsartan (EXFORGE) 10-320 MG tablet Take 1 tablet by mouth daily.   chlorthalidone (HYGROTON) 25 MG tablet Take 1 tablet (25 mg total) by mouth in the morning with food   furosemide (LASIX) 40 MG tablet Take 1 tablet (40 mg total) by mouth daily.   ketorolac (ACULAR) 0.5 % ophthalmic solution Place 1 drop into the right eye 3 (three) times daily.   Multiple Vitamin (MULTIVITAMIN)  tablet Take 1 tablet by mouth daily.   potassium chloride (KLOR-CON M) 10 MEQ tablet Take 1 tablet (10 mEq total) by mouth daily with food   prednisoLONE acetate (PRED FORTE) 1 % ophthalmic suspension Place 1 drop into the right eye 4  times daily FOR 1 WEEK, then 1 drop, 3 times a day x 1week, THEN 1 drop, twice a day x 1week, THEN 1 drop daily x 1week.   valACYclovir (VALTREX) 1000 MG tablet Take 1 tablet (1,000 mg total) by mouth daily.   Vitamin D, Ergocalciferol, (DRISDOL) 1.25 MG (50000 UNIT) CAPS capsule Take 1 capsule (50,000 Units total) by mouth every 7 (seven) days.   [DISCONTINUED] rosuvastatin (CRESTOR) 10 MG tablet Take 1 tablet (10 mg total) by mouth daily.   [DISCONTINUED] sertraline (ZOLOFT) 25 MG tablet Take 1 tablet (25 mg total) by mouth daily.   No facility-administered medications prior to visit.         Clinical Data Analysis:   Physical Exam  BP 94/62 (BP Location: Left Arm, Patient Position: Sitting)   Pulse 69   Temp 98.1 F (36.7 C) (Temporal)   Ht 5' 6.5" (1.689 m)   Wt 231 lb 12.8 oz (105.1 kg)   SpO2 94%   BMI 36.85 kg/m  Wt Readings from Last 10 Encounters:  09/23/22 231 lb 12.8 oz (105.1 kg)  07/01/22 234 lb 6.4 oz (106.3 kg)  05/14/22 237 lb (107.5 kg)  02/15/22 236 lb (107 kg)  05/08/21 250 lb (113.4 kg)  04/29/21 238 lb (108 kg)  11/05/20 238 lb (108 kg)  04/17/20 244 lb (110.7 kg)  10/16/19 230 lb (104.3 kg)  09/20/19 233 lb (105.7 kg)   Vital signs reviewed.  Nursing notes reviewed. Weight trend reviewed. Abnormalities and Problem-Specific physical exam findings:  very stressed, very polite  General Appearance:  No acute distress appreciable.   Well-groomed, healthy-appearing female.  Well proportioned with no abnormal fat distribution.  Good muscle tone. Skin: Clear and well-hydrated. Pulmonary:  Normal  work of breathing at rest, no respiratory distress apparent. SpO2: 94 %  Musculoskeletal: All extremities are intact.  Neurological:   Awake, alert, oriented, and engaged.  No obvious focal neurological deficits or cognitive impairments.  Sensorium seems unclouded.   Speech is clear and coherent with logical content. Psychiatric:  Appropriate mood, pleasant and cooperative demeanor, thoughtful and engaged during the exam  Results Reviewed:    Results for orders placed or performed in visit on 09/23/22  POCT HgB A1C  Result Value Ref Range   Hemoglobin A1C 6.0 (A) 4.0 - 5.6 %    Office Visit on 09/23/2022  Component Date Value   Hemoglobin A1C 09/23/2022 6.0 (A)   Scanned Document on 07/02/2022  Component Date Value   HM Colonoscopy 12/12/2021 See Report (in chart)   Office Visit on 07/01/2022  Component Date Value   Hepatitis C Ab 07/01/2022 NON-REACTIVE    Cholesterol 07/01/2022 187    Triglycerides 07/01/2022 47.0    HDL 07/01/2022 78.10    VLDL 07/01/2022 9.4    LDL Cholesterol 07/01/2022 100 (H)    Total CHOL/HDL Ratio 07/01/2022 2    NonHDL 07/01/2022 109.09    WBC 07/01/2022 4.9    RBC 07/01/2022 5.17 (H)    Platelets 07/01/2022 372.0    Hemoglobin 07/01/2022 13.8    HCT 07/01/2022 41.1    MCV 07/01/2022 79.4    MCHC 07/01/2022 33.6    RDW 07/01/2022 14.7    Sodium 07/01/2022 138    Potassium 07/01/2022 3.2 (L)    Chloride 07/01/2022 98    CO2 07/01/2022 31    Glucose, Bld 07/01/2022 102 (H)    BUN 07/01/2022 11    Creatinine, Ser 07/01/2022 0.66    Total Bilirubin 07/01/2022 0.7    Alkaline Phosphatase 07/01/2022 83    AST 07/01/2022 17    ALT 07/01/2022 14    Total Protein 07/01/2022 7.1    Albumin 07/01/2022 4.2    GFR 07/01/2022 94.83    Calcium 07/01/2022 9.7    HIV 1&2 Ab, 4th Generati* 07/01/2022 NON-REACTIVE    No image results found.   No results found.     This encounter employed real-time, collaborative documentation. The patient actively reviewed and updated their medical record on a shared screen, ensuring transparency and facilitating joint problem-solving for the problem  list, overview, and plan. This approach promotes accurate, informed care. The treatment plan was discussed and reviewed in detail, including medication safety, potential side effects, and all patient questions. We confirmed understanding and comfort with the plan. Follow-up instructions were established, including contacting the office for any concerns, returning if symptoms worsen, persist, or new symptoms develop, and precautions for potential emergency department visits. ----------------------------------------------------- Lula Olszewski, MD  09/23/2022 7:55 PM  [facility]:  [phone]

## 2022-10-05 ENCOUNTER — Other Ambulatory Visit (HOSPITAL_COMMUNITY): Payer: Self-pay

## 2022-10-13 ENCOUNTER — Other Ambulatory Visit (HOSPITAL_COMMUNITY): Payer: Self-pay

## 2022-10-14 ENCOUNTER — Other Ambulatory Visit (HOSPITAL_COMMUNITY): Payer: Self-pay

## 2022-10-21 ENCOUNTER — Ambulatory Visit: Payer: 59 | Admitting: Internal Medicine

## 2022-10-22 ENCOUNTER — Other Ambulatory Visit (HOSPITAL_COMMUNITY): Payer: Self-pay

## 2022-10-27 ENCOUNTER — Other Ambulatory Visit (HOSPITAL_COMMUNITY): Payer: Self-pay

## 2022-11-16 ENCOUNTER — Ambulatory Visit (INDEPENDENT_AMBULATORY_CARE_PROVIDER_SITE_OTHER): Payer: 59 | Admitting: Internal Medicine

## 2022-11-16 ENCOUNTER — Encounter: Payer: Self-pay | Admitting: Internal Medicine

## 2022-11-16 VITALS — BP 165/99 | HR 61 | Temp 98.4°F | Ht 66.5 in | Wt 239.8 lb

## 2022-11-16 DIAGNOSIS — M25562 Pain in left knee: Secondary | ICD-10-CM

## 2022-11-16 DIAGNOSIS — I10 Essential (primary) hypertension: Secondary | ICD-10-CM | POA: Diagnosis not present

## 2022-11-16 DIAGNOSIS — Z566 Other physical and mental strain related to work: Secondary | ICD-10-CM

## 2022-11-16 DIAGNOSIS — E876 Hypokalemia: Secondary | ICD-10-CM

## 2022-11-16 DIAGNOSIS — R198 Other specified symptoms and signs involving the digestive system and abdomen: Secondary | ICD-10-CM | POA: Diagnosis not present

## 2022-11-16 NOTE — Assessment & Plan Note (Signed)
Merged with meniscal tear. Did not address. She is walking ok

## 2022-11-16 NOTE — Assessment & Plan Note (Signed)
The importance of fiber intake for regular bowel movements was discussed. They should increase dietary fiber intake to at least 20 grams per day and consider fiber supplements.

## 2022-11-16 NOTE — Assessment & Plan Note (Signed)
Anxiety and Depression Mild anxiety and depression were noted, showing improvement from moderate to severe levels last month. We will discontinue Abilify 2mg  and continue Prozac (fluoxetine) 20mg . They are encouraged to consider the use of AI therapy for additional support.

## 2022-11-16 NOTE — Patient Instructions (Signed)
VISIT SUMMARY:  During your visit, we discussed your ongoing anxiety and depression, hypertension, prediabetes, irregular bowel movements, and low potassium levels. We made some changes to your medication and discussed the importance of dietary changes and regular follow-up visits.  YOUR PLAN:  -ANXIETY AND DEPRESSION: Your anxiety and depression have improved from last month. We will stop the Abilify and continue with Prozac. You might also consider using AI therapy for additional support. AI therapy is a type of therapy that uses artificial intelligence to provide mental health support.  -HYPERTENSION: Your blood pressure is well controlled with your current medication, Exforge. We will continue this treatment. Hypertension is a condition where the force of the blood against the artery walls is too high.  -PREDIABETES: We discussed the potential benefits of starting Metformin for weight loss and blood sugar control. Prediabetes is a condition where your blood sugar levels are higher than normal, but not high enough to be diagnosed as diabetes.  -IRREGULAR BOWEL MOVEMENTS: We discussed the importance of fiber for regular bowel movements. You should aim to consume at least 20 grams of fiber per day and consider taking fiber supplements.  -LOW POTASSIUM: Your potassium levels were low in March. You plan to increase your dietary potassium intake. Low potassium is a condition where the level of potassium in your blood is lower than normal.  INSTRUCTIONS:  A follow-up visit is scheduled in 3-6 months, or sooner if symptoms worsen, depending on your insurance situation and symptom management. This is important to monitor your health and make any necessary adjustments to your treatment plan.

## 2022-11-16 NOTE — Progress Notes (Signed)
[facility]: [phone]   Routine Medical Office Visit  Patient:  Christine Eaton      Age: 62 y.o.       Sex:  female  Date:   11/16/2022 Patient Care Team: Lula Olszewski, MD as PCP - General (Internal Medicine) Gastroenterology, [facility] Today's Healthcare Provider: Lula Olszewski, MD   Assessment and Plan:   Sawsan was seen today for 1 month follow-up, work related stress, hypertension and elevated cholesterol.  Work-related stress Overview: Anxiety is improved with resigning her job and recent medication(s) changes, would like to reduce medication(s)     11/16/2022    3:21 PM 09/23/2022    3:13 PM 07/01/2022    1:43 PM 07/20/2019    7:31 AM  Depression screen PHQ 2/9  Decreased Interest 2 2 2 1   Down, Depressed, Hopeless 2 2 2 1   PHQ - 2 Score 4 4 4 2   Altered sleeping 0 2 1 2   Tired, decreased energy 2 2 1 2   Change in appetite 2 3 1 1   Feeling bad or failure about yourself  2 2 1 2   Trouble concentrating 2 3 1 2   Moving slowly or fidgety/restless 1 0 0 1  Suicidal thoughts 0 0 0 0  PHQ-9 Score 13 16 9 12   Difficult doing work/chores Somewhat difficult Somewhat difficult Somewhat difficult Somewhat difficult      11/16/2022    3:22 PM 09/23/2022    3:14 PM 07/01/2022    1:43 PM  GAD 7 : Generalized Anxiety Score  Nervous, Anxious, on Edge 3 3 3   Control/stop worrying 2 3 2   Worry too much - different things 2 3 2   Trouble relaxing 1 2 1   Restless 1 1 1   Easily annoyed or irritable 1 1 0  Afraid - awful might happen 1 3 2   Total GAD 7 Score 11 16 11   Anxiety Difficulty Somewhat difficult Somewhat difficult Very difficult         Assessment & Plan: Anxiety and Depression Mild anxiety and depression were noted, showing improvement from moderate to severe levels last month. We will discontinue Abilify 2mg  and continue Prozac (fluoxetine) 20mg . They are encouraged to consider the use of AI therapy for additional  support.   Primary hypertension Overview: Interim history from September 23, 2022:    Home readings: not checking Patient reports taking current medications consistently and not experiencing any significant associated side effects or symptoms. Current hypertension medications:       Sig   amLODipine-valsartan (EXFORGE) 10-320 MG tablet (Taking) Take 1 tablet by mouth daily.   chlorthalidone (HYGROTON) 25 MG tablet (Taking) Take 1 tablet (25 mg total) by mouth in the morning with food   furosemide (LASIX) 40 MG tablet (Taking) Take 1 tablet (40 mg total) by mouth daily.      Lab Results  Component Value Date   NA 138 07/01/2022   K 3.2 (L) 07/01/2022   CREATININE 0.66 07/01/2022   GFR 94.83 07/01/2022     Assessment & Plan: Their blood pressure is well controlled on the current Exforge regimen, which will be continued as prescribed.   Hypokalemia due to loss of potassium Overview: Associated with chlorthalidone for blood pressure Lab Results  Component Value Date/Time   K 3.2 (L) 07/01/2022 02:42 PM   K 3.7 04/29/2021 06:45 AM   K 3.2 (L) 04/28/2021 10:08 AM   K 3.6 07/20/2019 10:07 AM   K 2.9 (L) 10/02/2015 08:12 AM  K 3.9 01/28/2012 04:00 PM   K 3.6 06/18/2007 03:30 PM   Lab Results  Component Value Date/Time   MG 2.1 04/28/2021 02:00 PM     Assessment & Plan: Low potassium levels were noted in March. They plan to increase dietary potassium intake. Declined lab repeat   Morbid obesity (HCC) Assessment & Plan: We discussed the potential benefits of starting Metformin for weight loss and blood sugar control.   Irregular bowel habits Assessment & Plan: The importance of fiber intake for regular bowel movements was discussed. They should increase dietary fiber intake to at least 20 grams per day and consider fiber supplements.   Pain in joint of left knee Assessment & Plan: Merged with meniscal tear. Did not address. She is walking ok   Follow-up The  importance of regular follow-up visits was emphasized, especially given the recent changes in medication regimen. A follow-up visit is scheduled in 3-6 months, or sooner if symptoms worsen, depending on their insurance situation and symptom management.     Recommended follow up: 1 month - 3 months for mood, or at least for an annual. Future Appointments  Date Time Provider Department Center  05/19/2023  2:00 PM Lula Olszewski, MD [facility]  05/20/2023  3:00 PM Lomax, Amy, NP [facility] None           Clinical Presentation:    62 y.o. female who has Hypertension; Fibroadenoma of right breast; Chronic back pain; Herpes simplex; Obesity; Hypokalemia due to loss of potassium; Morbid obesity (HCC); Severe obstructive sleep apnea-hypopnea syndrome; Prediabetes; Pain in joint of left knee; Work-related stress; History of colon polyps; Hyperlipidemia; Healthcare maintenance; and Irregular bowel habits on their problem list. Her reasons/main concerns/chief complaints for today's office visit are 1 month follow-up, Work related stress, Hypertension, and Elevated cholesterol   AI-Extracted: Discussed the use of AI scribe software for clinical note transcription with the patient, who gave verbal consent to proceed.  History of Present Illness   The patient, previously employed at [employer], presents for a one-month follow-up consultation. They have been on Abilify 2mg  and Prozac fluoxetine 20mg  for anxiety management. The patient is currently in the process of changing insurance due to discontinuation with [facility], which has caused some uncertainty and stress.  The patient reports persistent anxiety, although they deny suicidal ideation. They express a desire to maintain regular follow-ups for their mental health, despite potential financial constraints due to the insurance change. They also express a desire to discontinue the use of mood medications in the future, hoping that a change in their work  environment will alleviate their anxiety.  The patient also reports inconsistent bowel movements, which they attribute to age. They deny any new medications and report no changes in their current regimen, which includes Exforge for blood pressure management. They have a history of prediabetes, for which Metformin was suggested as a potential treatment option.  The patient has a history of a meniscus tear and a mass on their foot, which has since resolved. They also report occasional issues with their knee, which they attribute to weather changes. Their cholesterol levels are reported to be within acceptable ranges.  In terms of lifestyle changes, the patient has recently left their job at [employer] and is considering different employment options, including part-time work and travel-based roles. They express a desire to maintain their health and continue their healthcare, despite the job loss and insurance change.      She  has a past medical history  of Anxiety, Arthritis (May 2023), Back pain, Candidiasis of vagina (08/12/2017), Chest pain (04/28/2021), Depression, Fibroids, Ganglion of right hand (05/21/2015), Hallux valgus (11/04/2021), Hypertension, Infertility, female, Knee pain, Mass of foot (11/04/2021), Mass of right hand (09/2015), Neck pain, Pain in left foot (10/28/2021), Pelvic pain in female, Plantar fasciitis, Sleep apnea (July 2020), and Snoring (08/23/2019).  Problem overviews that were updated today: Problem  Irregular Bowel Habits  Work-Related Stress   Anxiety is improved with resigning her job and recent medication(s) changes, would like to reduce medication(s)     11/16/2022    3:21 PM 09/23/2022    3:13 PM 07/01/2022    1:43 PM 07/20/2019    7:31 AM  Depression screen PHQ 2/9  Decreased Interest 2 2 2 1   Down, Depressed, Hopeless 2 2 2 1   PHQ - 2 Score 4 4 4 2   Altered sleeping 0 2 1 2   Tired, decreased energy 2 2 1 2   Change in appetite 2 3 1 1   Feeling bad or failure  about yourself  2 2 1 2   Trouble concentrating 2 3 1 2   Moving slowly or fidgety/restless 1 0 0 1  Suicidal thoughts 0 0 0 0  PHQ-9 Score 13 16 9 12   Difficult doing work/chores Somewhat difficult Somewhat difficult Somewhat difficult Somewhat difficult      11/16/2022    3:22 PM 09/23/2022    3:14 PM 07/01/2022    1:43 PM  GAD 7 : Generalized Anxiety Score  Nervous, Anxious, on Edge 3 3 3   Control/stop worrying 2 3 2   Worry too much - different things 2 3 2   Trouble relaxing 1 2 1   Restless 1 1 1   Easily annoyed or irritable 1 1 0  Afraid - awful might happen 1 3 2   Total GAD 7 Score 11 16 11   Anxiety Difficulty Somewhat difficult Somewhat difficult Very difficult          Pain in Joint of Left Knee  Hypokalemia Due to Loss of Potassium   Associated with chlorthalidone for blood pressure Lab Results  Component Value Date/Time   K 3.2 (L) 07/01/2022 02:42 PM   K 3.7 04/29/2021 06:45 AM   K 3.2 (L) 04/28/2021 10:08 AM   K 3.6 07/20/2019 10:07 AM   K 2.9 (L) 10/02/2015 08:12 AM   K 3.9 01/28/2012 04:00 PM   K 3.6 06/18/2007 03:30 PM   Lab Results  Component Value Date/Time   MG 2.1 04/28/2021 02:00 PM      Morbid Obesity (Hcc)  Hypertension   Interim history from September 23, 2022:    Home readings: not checking Patient reports taking current medications consistently and not experiencing any significant associated side effects or symptoms. Current hypertension medications:       Sig   amLODipine-valsartan (EXFORGE) 10-320 MG tablet (Taking) Take 1 tablet by mouth daily.   chlorthalidone (HYGROTON) 25 MG tablet (Taking) Take 1 tablet (25 mg total) by mouth in the morning with food   furosemide (LASIX) 40 MG tablet (Taking) Take 1 tablet (40 mg total) by mouth daily.      Lab Results  Component Value Date   NA 138 07/01/2022   K 3.2 (L) 07/01/2022   CREATININE 0.66 07/01/2022   GFR 94.83 07/01/2022      Mass of Foot (Resolved)    Current Outpatient  Medications on File Prior to Visit  Medication Sig   amLODipine-valsartan (EXFORGE) 10-320 MG tablet Take 1 tablet by mouth daily.  ARIPiprazole (ABILIFY) 2 MG tablet Take 1 tablet (2 mg total) by mouth daily.   chlorthalidone (HYGROTON) 25 MG tablet Take 1 tablet (25 mg total) by mouth in the morning with food   FLUoxetine (PROZAC) 20 MG capsule Take 1 capsule (20 mg total) by mouth daily.   furosemide (LASIX) 40 MG tablet Take 1 tablet (40 mg total) by mouth daily.   ketorolac (ACULAR) 0.5 % ophthalmic solution Place 1 drop into the right eye 3 (three) times daily.   Multiple Vitamin (MULTIVITAMIN) tablet Take 1 tablet by mouth daily.   potassium chloride (KLOR-CON M) 10 MEQ tablet Take 1 tablet (10 mEq total) by mouth daily with food   prednisoLONE acetate (PRED FORTE) 1 % ophthalmic suspension Place 1 drop into the right eye 4  times daily FOR 1 WEEK, then 1 drop, 3 times a day x 1week, THEN 1 drop, twice a day x 1week, THEN 1 drop daily x 1week.   rosuvastatin (CRESTOR) 10 MG tablet Take 1 tablet (10 mg total) by mouth daily.   valACYclovir (VALTREX) 1000 MG tablet Take 1 tablet (1,000 mg total) by mouth daily.   Vitamin D, Ergocalciferol, (DRISDOL) 1.25 MG (50000 UNIT) CAPS capsule Take 1 capsule (50,000 Units total) by mouth every 7 (seven) days.   No current facility-administered medications on file prior to visit.   There are no discontinued medications.       Clinical Data Analysis:   Physical Exam  BP (!) 165/99 (BP Location: Left Arm, Patient Position: Sitting)   Pulse 61   Temp 98.4 F (36.9 C) (Temporal)   Ht 5' 6.5" (1.689 m)   Wt 239 lb 12.8 oz (108.8 kg)   SpO2 96%   BMI 38.12 kg/m  Wt Readings from Last 10 Encounters:  11/16/22 239 lb 12.8 oz (108.8 kg)  09/23/22 231 lb 12.8 oz (105.1 kg)  07/01/22 234 lb 6.4 oz (106.3 kg)  05/14/22 237 lb (107.5 kg)  02/15/22 236 lb (107 kg)  05/08/21 250 lb (113.4 kg)  04/29/21 238 lb (108 kg)  11/05/20 238 lb (108 kg)   04/17/20 244 lb (110.7 kg)  10/16/19 230 lb (104.3 kg)   Vital signs reviewed.  Nursing notes reviewed. Weight trend reviewed. Abnormalities and Problem-Specific physical exam findings:  still seems slightly anxious. Weight gain noted.  General Appearance:  No acute distress appreciable.   Well-groomed, healthy-appearing female.  Well proportioned with no abnormal fat distribution.  Good muscle tone. Skin: Clear and well-hydrated. Pulmonary:  Normal work of breathing at rest, no respiratory distress apparent. SpO2: 96 %  Musculoskeletal: All extremities are intact.  Neurological:  Awake, alert, oriented, and engaged.  No obvious focal neurological deficits or cognitive impairments.  Sensorium seems unclouded.   Speech is clear and coherent with logical content. Psychiatric:  Appropriate mood, pleasant and cooperative demeanor, thoughtful and engaged during the exam  Results Reviewed:     No results found for any visits on 11/16/22.  Office Visit on 09/23/2022  Component Date Value   Hemoglobin A1C 09/23/2022 6.0 (A)   Scanned Document on 07/02/2022  Component Date Value   HM Colonoscopy 12/12/2021 See Report (in chart)   Office Visit on 07/01/2022  Component Date Value   Hepatitis C Ab 07/01/2022 NON-REACTIVE    Cholesterol 07/01/2022 187    Triglycerides 07/01/2022 47.0    HDL 07/01/2022 78.10    VLDL 07/01/2022 9.4    LDL Cholesterol 07/01/2022 100 (H)    Total CHOL/HDL Ratio 07/01/2022  2    NonHDL 07/01/2022 109.09    WBC 07/01/2022 4.9    RBC 07/01/2022 5.17 (H)    Platelets 07/01/2022 372.0    Hemoglobin 07/01/2022 13.8    HCT 07/01/2022 41.1    MCV 07/01/2022 79.4    MCHC 07/01/2022 33.6    RDW 07/01/2022 14.7    Sodium 07/01/2022 138    Potassium 07/01/2022 3.2 (L)    Chloride 07/01/2022 98    CO2 07/01/2022 31    Glucose, Bld 07/01/2022 102 (H)    BUN 07/01/2022 11    Creatinine, Ser 07/01/2022 0.66    Total Bilirubin 07/01/2022 0.7    Alkaline Phosphatase  07/01/2022 83    AST 07/01/2022 17    ALT 07/01/2022 14    Total Protein 07/01/2022 7.1    Albumin 07/01/2022 4.2    GFR 07/01/2022 94.83    Calcium 07/01/2022 9.7    HIV 1&2 Ab, 4th Generati* 07/01/2022 NON-REACTIVE    No image results found.   No results found.  MM 3D SCREEN BREAST BILATERAL  Result Date: 04/02/2022 CLINICAL DATA:  Screening. EXAM: DIGITAL SCREENING BILATERAL MAMMOGRAM WITH TOMOSYNTHESIS AND CAD TECHNIQUE: Bilateral screening digital craniocaudal and mediolateral oblique mammograms were obtained. Bilateral screening digital breast tomosynthesis was performed. The images were evaluated with computer-aided detection. COMPARISON:  Previous exam(s). ACR Breast Density Category b: There are scattered areas of fibroglandular density. FINDINGS: There are no findings suspicious for malignancy. IMPRESSION: No mammographic evidence of malignancy. A result letter of this screening mammogram will be mailed directly to the patient. RECOMMENDATION: Screening mammogram in one year. (Code:SM-B-01Y) BI-RADS CATEGORY  1: Negative. Electronically Signed   By: Bary Richard M.D.   On: 04/02/2022 13:30      This encounter employed real-time, collaborative documentation. The patient actively reviewed and updated their medical record on a shared screen, ensuring transparency and facilitating joint problem-solving for the problem list, overview, and plan. This approach promotes accurate, informed care. The treatment plan was discussed and reviewed in detail, including medication safety, potential side effects, and all patient questions. We confirmed understanding and comfort with the plan. Follow-up instructions were established, including contacting the office for any concerns, returning if symptoms worsen, persist, or new symptoms develop, and precautions for potential emergency department visits. ----------------------------------------------------- Lula Olszewski, MD  11/16/2022 8:48 PM  [facility]:  [phone]

## 2022-11-16 NOTE — Assessment & Plan Note (Signed)
Their blood pressure is well controlled on the current Exforge regimen, which will be continued as prescribed.

## 2022-11-16 NOTE — Assessment & Plan Note (Signed)
We discussed the potential benefits of starting Metformin for weight loss and blood sugar control.

## 2022-11-16 NOTE — Assessment & Plan Note (Signed)
Low potassium levels were noted in March. They plan to increase dietary potassium intake. Declined lab repeat

## 2022-11-23 DIAGNOSIS — G4733 Obstructive sleep apnea (adult) (pediatric): Secondary | ICD-10-CM | POA: Diagnosis not present

## 2023-01-14 ENCOUNTER — Other Ambulatory Visit (HOSPITAL_COMMUNITY): Payer: Self-pay

## 2023-01-15 ENCOUNTER — Other Ambulatory Visit (HOSPITAL_COMMUNITY): Payer: Self-pay

## 2023-01-18 ENCOUNTER — Other Ambulatory Visit (HOSPITAL_COMMUNITY): Payer: Self-pay

## 2023-01-19 ENCOUNTER — Other Ambulatory Visit (HOSPITAL_COMMUNITY): Payer: Self-pay

## 2023-02-27 ENCOUNTER — Ambulatory Visit: Payer: 59

## 2023-03-23 ENCOUNTER — Other Ambulatory Visit (HOSPITAL_COMMUNITY): Payer: Self-pay

## 2023-03-26 ENCOUNTER — Telehealth: Payer: Self-pay | Admitting: *Deleted

## 2023-03-26 NOTE — Telephone Encounter (Signed)
Copied from CRM [id number]. Topic: Clinical - Medication Question >> Mar 26, 2023 11:45 AM Prudencio Pair wrote: Reason for CRM: Patient states Dr. Jon Billings prescribed her diuretics at her visit in August. She wants to know if he wants her to take both of them or just one? Patient states she was prescribed Furosemide 40 mg & Chlorthalidone 25 mg. Should she stop taking the Chlorthalidone and continue Furosemide or take both? Please give patient a call back to advise her of what she needs to do. CB #: [id number].  Dr. Jon Billings, please advise and send back to [facility].

## 2023-05-19 ENCOUNTER — Ambulatory Visit (INDEPENDENT_AMBULATORY_CARE_PROVIDER_SITE_OTHER): Payer: No Typology Code available for payment source | Admitting: Internal Medicine

## 2023-05-19 ENCOUNTER — Encounter: Payer: Self-pay | Admitting: Internal Medicine

## 2023-05-19 ENCOUNTER — Other Ambulatory Visit (HOSPITAL_COMMUNITY): Payer: Self-pay

## 2023-05-19 VITALS — BP 121/78 | HR 62 | Temp 97.3°F | Ht 66.5 in | Wt 249.2 lb

## 2023-05-19 DIAGNOSIS — G4733 Obstructive sleep apnea (adult) (pediatric): Secondary | ICD-10-CM

## 2023-05-19 DIAGNOSIS — E119 Type 2 diabetes mellitus without complications: Secondary | ICD-10-CM

## 2023-05-19 DIAGNOSIS — E66812 Obesity, class 2: Secondary | ICD-10-CM

## 2023-05-19 DIAGNOSIS — I10 Essential (primary) hypertension: Secondary | ICD-10-CM

## 2023-05-19 DIAGNOSIS — F411 Generalized anxiety disorder: Secondary | ICD-10-CM | POA: Diagnosis not present

## 2023-05-19 DIAGNOSIS — R0989 Other specified symptoms and signs involving the circulatory and respiratory systems: Secondary | ICD-10-CM | POA: Diagnosis not present

## 2023-05-19 DIAGNOSIS — E876 Hypokalemia: Secondary | ICD-10-CM

## 2023-05-19 DIAGNOSIS — R29898 Other symptoms and signs involving the musculoskeletal system: Secondary | ICD-10-CM

## 2023-05-19 MED ORDER — TIRZEPATIDE-WEIGHT MANAGEMENT 2.5 MG/0.5ML ~~LOC~~ SOLN
2.5000 mg | SUBCUTANEOUS | 11 refills | Status: DC
  Filled 2023-05-19: qty 2, 28d supply, fill #0

## 2023-05-19 NOTE — Assessment & Plan Note (Signed)
Obstructive sleep apnea/hypertension with BMI over 35 Will reorder Zepbound which is approved for

## 2023-05-19 NOTE — Patient Instructions (Addendum)
VISIT SUMMARY:  Today, we discussed several health concerns, including your anxiety, sleep apnea, hypertension, left leg weakness, and recent chest congestion. We also reviewed your general health maintenance and the importance of regular blood work.  YOUR PLAN:  -GENERALIZED ANXIETY DISORDER: Generalized Anxiety Disorder is a condition characterized by persistent and excessive worry. Your anxiety has improved but still persists. We discussed the potential weight gain associated with anxiety medications and the possibility of switching to Trintellix, which is less likely to cause weight gain. You expressed interest in seeing a psychiatrist for better management of your anxiety and weight issues. We will refer you to a psychiatrist to explore this and other options. Continue taking Abilify 2 mg.  -SLEEP APNEA: Sleep apnea is a condition where breathing repeatedly stops and starts during sleep. Your severe sleep apnea may contribute to anxiety and other health issues. We discussed the benefits of weight loss in managing sleep apnea and reducing dementia risk. Untreated sleep apnea can lead to significant health issues. We will submit prior authorization for Zepbound, a recently approved weight loss medication, which could help manage your sleep apnea. Continue using your CPAP machine.  -HYPERTENSION: Hypertension is high blood pressure, which can lead to serious health problems if not managed. Your blood pressure is well-controlled with your current medications. We emphasized the importance of regular blood work to monitor potassium levels due to your medication use, as potassium imbalance can be life-threatening. Continue your current antihypertensive medications. We will order blood work to monitor potassium levels and other relevant parameters.  -LEFT LEG WEAKNESS: Left leg weakness, especially when standing up, may be related to your previous knee surgery or other musculoskeletal issues. We ruled out a  stroke. Physical therapy could help identify and address the underlying cause and determine if further surgical intervention is needed. We will refer you to physical therapy for evaluation and treatment.  -CHEST CONGESTION: Chest congestion and cough can be symptoms of respiratory infections or other conditions. Your chest congestion and cough have improved with Mucinex, but you still feel some congestion. There is minor residual lung inflammation, but no chest x-ray is needed at this time. Continue using Mucinex for chest congestion and consider Robitussin DM for additional relief. Monitor your symptoms and return if they worsen.  -GENERAL HEALTH MAINTENANCE: Regular blood work is important to monitor cholesterol and A1c levels, especially given your elevated A1c and multiple medications. Monitoring is crucial for preventing complications. We will order a blood count, metabolic panel, A1c, lipids, and thyroid tests. Monitoring your cholesterol levels will help [place] evaluate the necessity of continuing Crestor.  INSTRUCTIONS:  Follow up in six months if your labs are normal, otherwise in three months. Please stop at the lab today for blood work.  It was a pleasure seeing you today! Your health and satisfaction are our top priorities.  Glenetta Hew, MD  Your Providers PCP: Lula Olszewski, MD,  [phone]) Referring Provider: Lula Olszewski, MD,  [phone]) Care Team Provider: Gastroenterology, [place],  [phone])     NEXT STEPS: [x]  Early Intervention: Schedule sooner appointment, call our on-call services, or go to emergency room if there is any significant Increase in pain or discomfort New or worsening symptoms Sudden or severe changes in your health [x]  Flexible Follow-Up: We recommend a Return in about 6 months (around 11/16/2023) for chronic disease monitoring and management. for optimal routine care. This allows for progress monitoring and treatment adjustments. [x]   Preventive Care: Schedule your annual preventive care visit! It's  typically covered by insurance and helps identify potential health issues early. [x]  Lab & X-ray Appointments: Incomplete tests scheduled today, or call to schedule. X-rays: [facility] (M-F, 8:30am-noon or 1pm-5pm). [x]  Medical Information Release: Sign a release form at front desk to obtain relevant medical information we don't have.  MAKING THE MOST OF OUR FOCUSED 20 MINUTE APPOINTMENTS: [x]   Clearly state your top concerns at the beginning of the visit to focus our discussion [x]   If you anticipate you will need more time, please inform the front desk during scheduling - we can book multiple appointments in the same week. [x]   If you have transportation problems- use our convenient video appointments or ask about transportation support. [x]   We can get down to business faster if you use MyChart to update information before the visit and submit non-urgent questions before your visit. Thank you for taking the time to provide details through MyChart.  Let our nurse know and she can import this information into your encounter documents.  Arrival and Wait Times: [x]   Arriving on time ensures that everyone receives prompt attention. [x]   Early morning (8a) and afternoon (1p) appointments tend to have shortest wait times. [x]   Unfortunately, we cannot delay appointments for late arrivals or hold slots during phone calls.  Getting Answers and Following Up [x]   Simple Questions & Concerns: For quick questions or basic follow-up after your visit, reach [place] at [phone] or MyChart messaging. [x]   Complex Concerns: If your concern is more complex, scheduling an appointment might be best. Discuss this with the staff to find the most suitable option. [x]   Lab & Imaging Results: We'll contact you directly if results are abnormal or you don't use MyChart. Most normal results will be on MyChart within 2-3 business days, with  a review message from Dr. Jon Billings. Haven't heard back in 2 weeks? Need results sooner? Contact us at [phone]. [x]   Referrals: Our referral coordinator will manage specialist referrals. The specialist's office should contact you within 2 weeks to schedule an appointment. Call us if you haven't heard from them after 2 weeks.  Staying Connected [x]   MyChart: Activate your MyChart for the fastest way to access results and message [place]. See the last page of this paperwork for instructions on how to activate.  Bring to Your Next Appointment [x]   Medications: Please bring all your medication bottles to your next appointment to ensure we have an accurate record of your prescriptions. [x]   Health Diaries: If you're monitoring any health conditions at home, keeping a diary of your readings can be very helpful for discussions at your next appointment.  Billing [x]   X-ray & Lab Orders: These are billed by separate companies. Contact the invoicing company directly for questions or concerns. [x]   Visit Charges: Discuss any billing inquiries with our administrative services team.  Your Satisfaction Matters [x]   Share Your Experience: We strive for your satisfaction! If you have any complaints, or preferably compliments, please let Dr. Jon Billings know directly or contact our Practice Administrators, Edwena Felty or [employer], by asking at the front desk.   Reviewing Your Records [x]   Review this early draft of your clinical encounter notes below and the final encounter summary tomorrow on MyChart after its been completed.  All orders placed so far are visible here: Generalized anxiety disorder -     Ambulatory referral to Psychiatry  Hypokalemia due to loss of potassium  Chest congestion  Left leg weakness -  Ambulatory referral to Physical Therapy  Hypertension, unspecified type -     Lipid panel -     Comprehensive metabolic panel -     CBC with Differential/Platelet -     TSH Rfx on  Abnormal to Free T4 -     Hemoglobin A1c  Morbid obesity (HCC) -     Tirzepatide-Weight Management; Inject 2.5 mg into the skin once a week.  Dispense: 2 mL; Refill: 11  Class 2 severe obesity due to excess calories with serious comorbidity in adult, unspecified BMI (HCC) -     Tirzepatide-Weight Management; Inject 2.5 mg into the skin once a week.  Dispense: 2 mL; Refill: 11  Severe obstructive sleep apnea-hypopnea syndrome

## 2023-05-19 NOTE — Progress Notes (Signed)
==============================  [facility]: [phone]   -- Medical Office Visit --  Patient: Christine Eaton      Age: 63 y.o.       Sex:  female  Date:   05/19/2023 Today's Healthcare Provider: Lula Olszewski, MD  ==============================   CHIEF COMPLAINT: 6 month follow-up  SUBJECTIVE: Background This is a 63 y.o. female who has Hypertension; Fibroadenoma of right breast; Chronic back pain; Herpes simplex; Obesity; Hypokalemia due to loss of potassium; Morbid obesity (HCC); Severe obstructive sleep apnea-hypopnea syndrome; Prediabetes; Pain in joint of left knee; Work-related stress; History of colon polyps; Hyperlipidemia; and Irregular bowel habits on their problem list. History of Present Illness Christine Eaton is a 63 year old female with generalized anxiety disorder and hypertension who presents for a six-month follow-up.  She has experienced improvement in her depression and anxiety, although anxiety persists. She is currently taking Abilify 2 mg but has not been taking fluoxetine. She mentions stress eating and weight gain since retiring six months ago.  She has been experiencing chest congestion and cough for the past eight days. She has been using Mucinex for six days, which has improved her symptoms, but she still feels congested. No fever or feeling sick, and the cough is not as severe as it was initially.  She describes weakness in her left leg, which 'gives out' when she stands. This issue has been present since her knee surgery in 2013. She does not experience pain, but the weakness requires her to wait before taking a step. No weakness in her left arm.  She is taking a potassium supplement due to the potential depletion caused by her medications, including valsartan, Exforge, and Crestor. She is also taking a multivitamin and vitamin D supplement, which she adjusted after initially taking an incorrect dose. She reports  consistent blood pressure readings of 121/78 at home.  She has a history of sleep apnea and uses a CPAP machine. She mentions that her sleep apnea was severe, with 85% of the time not breathing during sleep.  Reviewed chart records that patient  has a past medical history of Anxiety, Arthritis (May 2023), Back pain, Candidiasis of vagina (08/12/2017), Chest pain (04/28/2021), Depression, Fibroids, Ganglion of right hand (05/21/2015), Hallux valgus (11/04/2021), Hypertension, Infertility, female, Knee pain, Mass of foot (11/04/2021), Mass of right hand (09/2015), Neck pain, Pain in left foot (10/28/2021), Pelvic pain in female, Plantar fasciitis, Sleep apnea (July 2020), and Snoring (08/23/2019). Discussed Past Medical History - History of depression - History of anxiety - History of chest cold - History of left leg weakness - History of leg swelling - History of high blood pressure - History of sleep apnea  Today's Verbally Confirmed Medications - Diuretics switched lasix to chlorthalidone, no leg swelling - Fluoxetine 20 mg not taking - Abilify 2 mg still taking but gaining weight - Altace - Amlodipine - Potassium supplement - Exforge 10/320 - Multivitamin - Crestor 10 mg - Valtrex 1000 mg - Vitamin D - Mucinex Current Outpatient Medications on File Prior to Visit  Medication Sig   amLODipine-valsartan (EXFORGE) 10-320 MG tablet Take 1 tablet by mouth daily.   ARIPiprazole (ABILIFY) 2 MG tablet Take 1 tablet (2 mg total) by mouth daily.   chlorthalidone (HYGROTON) 25 MG tablet Take 1 tablet (25 mg total) by mouth in the morning with food   Multiple Vitamin (MULTIVITAMIN) tablet Take 1 tablet by mouth daily.   potassium chloride (KLOR-CON M) 10  MEQ tablet Take 1 tablet (10 mEq total) by mouth daily with food   rosuvastatin (CRESTOR) 10 MG tablet Take 1 tablet (10 mg total) by mouth daily.   valACYclovir (VALTREX) 1000 MG tablet Take 1 tablet (1,000 mg total) by mouth daily.   No  current facility-administered medications on file prior to visit.   Medications Discontinued During This Encounter  Medication Reason   prednisoLONE acetate (PRED FORTE) 1 % ophthalmic suspension    ketorolac (ACULAR) 0.5 % ophthalmic solution    furosemide (LASIX) 40 MG tablet Completed Course   FLUoxetine (PROZAC) 20 MG capsule Completed Course   Vitamin D, Ergocalciferol, (DRISDOL) 1.25 MG (50000 UNIT) CAPS capsule Completed Course      Objective   Physical Exam     05/19/2023    2:18 PM 11/16/2022    3:37 PM 11/16/2022    3:13 PM  Vitals with BMI  Height 5' 6.5"  5' 6.5"  Weight 249 lbs 3 oz  239 lbs 13 oz  BMI 39.62  38.13  Systolic 121 165 161  Diastolic 78 99 88  Pulse 62  61   Wt Readings from Last 10 Encounters:  05/19/23 249 lb 3.2 oz (113 kg)  11/16/22 239 lb 12.8 oz (108.8 kg)  09/23/22 231 lb 12.8 oz (105.1 kg)  07/01/22 234 lb 6.4 oz (106.3 kg)  05/14/22 237 lb (107.5 kg)  02/15/22 236 lb (107 kg)  05/08/21 250 lb (113.4 kg)  04/29/21 238 lb (108 kg)  11/05/20 238 lb (108 kg)  04/17/20 244 lb (110.7 kg)   Vital signs reviewed.  Nursing notes reviewed. Weight trend reviewed. Abnormalities and Problem-Specific physical exam findings:  VITALS: BP- 121/78 CHEST: Single wheeze heard anteriorly, crackles at the base of the right lung initially present then resolved with deeper breaths. Left lung clear.  General Appearance:  No acute distress appreciable.   Well-groomed, healthy-appearing female.  Well proportioned with no abnormal fat distribution.  Good muscle tone. Pulmonary:  Normal work of breathing at rest, no respiratory distress apparent. SpO2: 98 %  Musculoskeletal: All extremities are intact.  Neurological:  Awake, alert, oriented, and engaged.  No obvious focal neurological deficits or cognitive impairments.  Sensorium seems unclouded.   Speech is clear and coherent with logical content. Psychiatric:  Appropriate mood, pleasant and cooperative demeanor,  thoughtful and engaged during the exam    No results found for any visits on 05/19/23. Office Visit on 09/23/2022  Component Date Value   Hemoglobin A1C 09/23/2022 6.0 (A)   Scanned Document on 07/02/2022  Component Date Value   HM Colonoscopy 12/12/2021 See Report (in chart)   Office Visit on 07/01/2022  Component Date Value   Hepatitis C Ab 07/01/2022 NON-REACTIVE    Cholesterol 07/01/2022 187    Triglycerides 07/01/2022 47.0    HDL 07/01/2022 78.10    VLDL 07/01/2022 9.4    LDL Cholesterol 07/01/2022 100 (H)    Total CHOL/HDL Ratio 07/01/2022 2    NonHDL 07/01/2022 109.09    WBC 07/01/2022 4.9    RBC 07/01/2022 5.17 (H)    Platelets 07/01/2022 372.0    Hemoglobin 07/01/2022 13.8    HCT 07/01/2022 41.1    MCV 07/01/2022 79.4    MCHC 07/01/2022 33.6    RDW 07/01/2022 14.7    Sodium 07/01/2022 138    Potassium 07/01/2022 3.2 (L)    Chloride 07/01/2022 98    CO2 07/01/2022 31    Glucose, Bld 07/01/2022 102 (H)    BUN  07/01/2022 11    Creatinine, Ser 07/01/2022 0.66    Total Bilirubin 07/01/2022 0.7    Alkaline Phosphatase 07/01/2022 83    AST 07/01/2022 17    ALT 07/01/2022 14    Total Protein 07/01/2022 7.1    Albumin 07/01/2022 4.2    GFR 07/01/2022 94.83    Calcium 07/01/2022 9.7    HIV 1&2 Ab, 4th Generati* 07/01/2022 NON-REACTIVE   No image results found. No results found.MM 3D SCREEN BREAST BILATERAL Result Date: 04/02/2022 CLINICAL DATA:  Screening. EXAM: DIGITAL SCREENING BILATERAL MAMMOGRAM WITH TOMOSYNTHESIS AND CAD TECHNIQUE: Bilateral screening digital craniocaudal and mediolateral oblique mammograms were obtained. Bilateral screening digital breast tomosynthesis was performed. The images were evaluated with computer-aided detection. COMPARISON:  Previous exam(s). ACR Breast Density Category b: There are scattered areas of fibroglandular density. FINDINGS: There are no findings suspicious for malignancy. IMPRESSION: No mammographic evidence of malignancy. A  result letter of this screening mammogram will be mailed directly to the patient. RECOMMENDATION: Screening mammogram in one year. (Code:SM-B-01Y) BI-RADS CATEGORY  1: Negative. Electronically Signed   By: Bary Richard M.D.   On: 04/02/2022 13:30       Assessment & Plan Generalized anxiety disorder Generalized Anxiety Disorder Anxiety has improved but persists. Currently taking Abilify 2 mg, not fluoxetine. Discussed potential weight gain with anxiety medications and the possibility of switching to Trintellix, which is less likely to cause weight gain. She expressed interest in seeing a psychiatrist for better management of anxiety and weight issues. Recommend psychiatrist referral to explore this and other options. Continue Abilify 2 mg. Hypokalemia due to loss of potassium Monitor potassium on chlorthalidone Blood pressure and swelling good today Continue(s) with kcl supplement  Chest congestion Chest Congestion Chest congestion and cough for eight days, improved with Mucinex. Minor residual lung inflammation noted, no chest x-ray needed at this time. Further evaluation if symptoms worsen. Continue Mucinex for chest congestion. Consider Robitussin DM for additional relief. Monitor symptoms and return if she worsens. Left leg weakness Left Leg Weakness Reports left leg weakness, particularly when standing up, possibly related to previous knee surgery or other musculoskeletal issues. Ruled out stroke. Physical therapy could help identify and address the underlying cause and determine if further surgical intervention is needed. Refer to physical therapy for evaluation and treatment. Hypertension, unspecified type Hypertension Blood pressure is well-controlled with Altalube and amlodipine. Recent readings: 121/78 mmHg. Emphasized importance of regular blood work to monitor potassium levels due to chlorthalidone use, as potassium imbalance can be life-threatening. Continue current antihypertensive  medications. Order blood work to monitor potassium levels and other relevant parameters. Morbid obesity (HCC) Obstructive sleep apnea/hypertension with BMI over 35 Will reorder Zepbound which is approved for Severe obstructive sleep apnea-hypopnea syndrome Sleep Apnea Severe sleep apnea may contribute to anxiety and other health issues. Discussed benefits of weight loss in managing sleep apnea and reducing dementia risk. Explained that untreated sleep apnea can lead to significant health issues. Discussed Zepbound, recently approved for weight loss, which could help manage sleep apnea. Submit prior authorization for Zepbound for weight loss. Continue using CPAP machine.  General Health Maintenance Discussed importance of regular blood work to monitor cholesterol and A1c levels, especially given elevated A1c and multiple medications. Monitoring is crucial for preventing complications. Order blood count, metabolic panel, A1c, lipids, and thyroid tests. Monitor cholesterol levels to evaluate the necessity of continuing Crestor.  Follow-up Follow up in six months if labs are normal, otherwise in three months. Stop at the lab today  for blood work.     Orders Placed During this Encounter:   Orders Placed This Encounter  Procedures   Lipid panel    [place]    Has the patient fasted?:   No    Release to patient:   Immediate [1]   Comprehensive metabolic panel    Has the patient fasted?:   No    Release to patient:   Immediate [1]   CBC with Differential/Platelet    Release to patient:   Immediate [1]   TSH Rfx on Abnormal to Free T4   HgB A1c   Ambulatory referral to Psychiatry    Referral Priority:   Routine    Referral Reason:   Specialty Services Required    Number of Visits Requested:   1   Ambulatory referral to Physical Therapy    Referral Priority:   Routine    Referral Type:   Physical Medicine    Referral Reason:   Specialty Services Required    Requested Specialty:   Physical  Therapy    Number of Visits Requested:   1   Meds ordered this encounter  Medications   tirzepatide (ZEPBOUND) 2.5 MG/0.5ML injection vial    Sig: Inject 2.5 mg into the skin once a week.    Dispense:  2 mL    Refill:  11       This document was synthesized by artificial intelligence ([employer]) using HIPAA-compliant recording of the clinical interaction;   We discussed the use of AI scribe software for clinical note transcription with the patient, who gave verbal consent to proceed.    Additional Info: This encounter employed state-of-the-art, real-time, collaborative documentation. The patient actively reviewed and assisted in updating their electronic medical record on a shared screen, ensuring transparency and facilitating joint problem-solving for the problem list, overview, and plan. This approach promotes accurate, informed care. The treatment plan was discussed and reviewed in detail, including medication safety, potential side effects, and all patient questions. We confirmed understanding and comfort with the plan. Follow-up instructions were established, including contacting the office for any concerns, returning if symptoms worsen, persist, or new symptoms develop, and precautions for potential emergency department visits.

## 2023-05-19 NOTE — Assessment & Plan Note (Signed)
Hypertension Blood pressure is well-controlled with Altalube and amlodipine. Recent readings: 121/78 mmHg. Emphasized importance of regular blood work to monitor potassium levels due to chlorthalidone use, as potassium imbalance can be life-threatening. Continue current antihypertensive medications. Order blood work to monitor potassium levels and other relevant parameters.

## 2023-05-19 NOTE — Assessment & Plan Note (Signed)
Sleep Apnea Severe sleep apnea may contribute to anxiety and other health issues. Discussed benefits of weight loss in managing sleep apnea and reducing dementia risk. Explained that untreated sleep apnea can lead to significant health issues. Discussed Zepbound, recently approved for weight loss, which could help manage sleep apnea. Submit prior authorization for Zepbound for weight loss. Continue using CPAP machine.

## 2023-05-19 NOTE — Assessment & Plan Note (Signed)
Monitor potassium on chlorthalidone Blood pressure and swelling good today Continue(s) with kcl supplement

## 2023-05-20 ENCOUNTER — Telehealth: Payer: Self-pay

## 2023-05-20 ENCOUNTER — Ambulatory Visit: Payer: Self-pay | Admitting: Family Medicine

## 2023-05-20 LAB — COMPREHENSIVE METABOLIC PANEL
ALT: 24 U/L (ref 0–35)
AST: 22 U/L (ref 0–37)
Albumin: 4 g/dL (ref 3.5–5.2)
Alkaline Phosphatase: 80 U/L (ref 39–117)
BUN: 13 mg/dL (ref 6–23)
CO2: 27 meq/L (ref 19–32)
Calcium: 9.3 mg/dL (ref 8.4–10.5)
Chloride: 101 meq/L (ref 96–112)
Creatinine, Ser: 0.64 mg/dL (ref 0.40–1.20)
GFR: 94.95 mL/min (ref >60.00)
Glucose, Bld: 86 mg/dL (ref 70–99)
Potassium: 3.9 meq/L (ref 3.5–5.1)
Sodium: 137 meq/L (ref 135–145)
Total Bilirubin: 0.4 mg/dL (ref 0.2–1.2)
Total Protein: 7.5 g/dL (ref 6.0–8.3)

## 2023-05-20 LAB — LIPID PANEL
Cholesterol: 171 mg/dL (ref 0–200)
HDL: 60.2 mg/dL (ref >39.00)
LDL Cholesterol: 95 mg/dL (ref 0–99)
NonHDL: 110.63
Total CHOL/HDL Ratio: 3
Triglycerides: 79 mg/dL (ref 0.0–149.0)
VLDL: 15.8 mg/dL (ref 0.0–40.0)

## 2023-05-20 LAB — CBC WITH DIFFERENTIAL/PLATELET
Basophils Absolute: 0 10*3/uL (ref 0.0–0.1)
Basophils Relative: 0.8 % (ref 0.0–3.0)
Eosinophils Absolute: 0 10*3/uL (ref 0.0–0.7)
Eosinophils Relative: 0.4 % (ref 0.0–5.0)
HCT: 43.4 % (ref 36.0–46.0)
Hemoglobin: 14 g/dL (ref 12.0–15.0)
Lymphocytes Relative: 31.6 % (ref 12.0–46.0)
Lymphs Abs: 1.2 10*3/uL (ref 0.7–4.0)
MCHC: 32.1 g/dL (ref 30.0–36.0)
MCV: 81.7 fL (ref 78.0–100.0)
Monocytes Absolute: 0.3 10*3/uL (ref 0.1–1.0)
Monocytes Relative: 8.1 % (ref 3.0–12.0)
Neutro Abs: 2.2 10*3/uL (ref 1.4–7.7)
Neutrophils Relative %: 59.1 % (ref 43.0–77.0)
Platelets: 356 10*3/uL (ref 150.0–400.0)
RBC: 5.31 Mil/uL — ABNORMAL HIGH (ref 3.87–5.11)
RDW: 14.2 % (ref 11.5–15.5)
WBC: 3.7 10*3/uL — ABNORMAL LOW (ref 4.0–10.5)

## 2023-05-20 LAB — TSH RFX ON ABNORMAL TO FREE T4: TSH: 0.573 u[IU]/mL (ref 0.450–4.500)

## 2023-05-20 LAB — HEMOGLOBIN A1C: Hgb A1c MFr Bld: 6.8 % — ABNORMAL HIGH (ref 4.6–6.5)

## 2023-05-20 NOTE — Progress Notes (Unsigned)
Christine Eaton

## 2023-05-20 NOTE — Telephone Encounter (Signed)
Please See MyChart message from today

## 2023-05-21 ENCOUNTER — Encounter: Payer: Self-pay | Admitting: Family Medicine

## 2023-05-21 ENCOUNTER — Telehealth (INDEPENDENT_AMBULATORY_CARE_PROVIDER_SITE_OTHER): Payer: No Typology Code available for payment source | Admitting: Family Medicine

## 2023-05-21 DIAGNOSIS — G4733 Obstructive sleep apnea (adult) (pediatric): Secondary | ICD-10-CM

## 2023-05-21 NOTE — Patient Instructions (Signed)

## 2023-05-21 NOTE — Progress Notes (Signed)
   PATIENT: Christine Eaton DOB: [birth year]  REASON FOR VISIT: follow up HISTORY FROM: patient  Virtual Visit via Telephone Note  I connected with Christine Eaton on 05/21/23 at 11:30 AM EST by telephone and verified that I am speaking with the correct person using two identifiers.   I discussed the limitations, risks, security and privacy concerns of performing an evaluation and management service by telephone and the availability of in person appointments. I also discussed with the patient that there may be a patient responsible charge related to this service. The patient expressed understanding and agreed to proceed.   History of Present Illness:  05/21/23 ALL: Christine Eaton is a 63 y.o. female here today for follow up for OSA on CPAP. She continues to do well on therapy. She is using CPAP nightly for about 5.5 hours, on average. She denies concerns with machine or supplies. She has had some trouble with billing as she is now retired. She was seen by PCP this week for head cold. She seems to be getting better.      Observations/Objective:  Generalized: Well developed, in no acute distress  Mentation: Alert oriented to time, place, history taking. Follows all commands speech and language fluent   Assessment and Plan:  63 y.o. year old female  has a past medical history of Anxiety, Arthritis (May 2023), Back pain, Candidiasis of vagina (08/12/2017), Chest pain (04/28/2021), Depression, Fibroids, Ganglion of right hand (05/21/2015), Hallux valgus (11/04/2021), Hypertension, Infertility, female, Knee pain, Mass of foot (11/04/2021), Mass of right hand (09/2015), Neck pain, Pain in left foot (10/28/2021), Pelvic pain in female, Plantar fasciitis, Sleep apnea (July 2020), and Snoring (08/23/2019). here with    ICD-10-CM   1. OSA on CPAP  G47.33       Christine Eaton is doing well on therapy. Compliance report shows excellent compliance. She was encouraged to continue using autoPAP  nightly for at least 4 hours. Supply orders sent. She will follow up in 1 year, sooner if needed.   No orders of the defined types were placed in this encounter.   No orders of the defined types were placed in this encounter.    Follow Up Instructions:  I discussed the assessment and treatment plan with the patient. The patient was provided an opportunity to ask questions and all were answered. The patient agreed with the plan and demonstrated an understanding of the instructions.   The patient was advised to call back or seek an in-person evaluation if the symptoms worsen or if the condition fails to improve as anticipated.  I provided 15 minutes of non-face-to-face time during this encounter. Patient located at their place of residence during Mychart visit. Provider is in the office.    Shawnie Dapper, NP

## 2023-05-23 ENCOUNTER — Encounter: Payer: Self-pay | Admitting: Internal Medicine

## 2023-05-23 ENCOUNTER — Other Ambulatory Visit (HOSPITAL_COMMUNITY): Payer: Self-pay

## 2023-05-23 DIAGNOSIS — E119 Type 2 diabetes mellitus without complications: Secondary | ICD-10-CM | POA: Insufficient documentation

## 2023-05-23 MED ORDER — TIRZEPATIDE 5 MG/0.5ML ~~LOC~~ SOAJ
5.0000 mg | SUBCUTANEOUS | 0 refills | Status: AC
  Filled 2023-05-23: qty 2, 28d supply, fill #0

## 2023-05-23 MED ORDER — TIRZEPATIDE 10 MG/0.5ML ~~LOC~~ SOAJ
10.0000 mg | SUBCUTANEOUS | 0 refills | Status: AC
  Filled 2023-05-23: qty 2, 28d supply, fill #0

## 2023-05-23 MED ORDER — TIRZEPATIDE 2.5 MG/0.5ML ~~LOC~~ SOAJ
2.5000 mg | SUBCUTANEOUS | 0 refills | Status: AC
  Filled 2023-05-23: qty 2, 28d supply, fill #0

## 2023-05-23 MED ORDER — TIRZEPATIDE 7.5 MG/0.5ML ~~LOC~~ SOAJ
7.5000 mg | SUBCUTANEOUS | 0 refills | Status: AC
Start: 2023-07-18 — End: ?
  Filled 2023-05-23: qty 2, 28d supply, fill #0

## 2023-05-23 MED ORDER — TIRZEPATIDE 12.5 MG/0.5ML ~~LOC~~ SOAJ
12.5000 mg | SUBCUTANEOUS | 0 refills | Status: AC
  Filled 2023-05-23: qty 2, 28d supply, fill #0

## 2023-05-23 MED ORDER — TIRZEPATIDE 15 MG/0.5ML ~~LOC~~ SOAJ
15.0000 mg | SUBCUTANEOUS | 5 refills | Status: AC
  Filled 2023-05-23: qty 6, 84d supply, fill #0

## 2023-05-23 NOTE — Addendum Note (Signed)
Addended by: Lula Olszewski on: 05/23/2023 06:01 PM   Modules accepted: Orders

## 2023-05-24 ENCOUNTER — Other Ambulatory Visit (HOSPITAL_COMMUNITY): Payer: Self-pay

## 2023-05-24 NOTE — Progress Notes (Signed)
 Community message sent to [employer] that CPAP supplies order placed.

## 2023-05-25 NOTE — Telephone Encounter (Signed)
 Patient's review of lab results/notes confirmed.

## 2023-05-26 ENCOUNTER — Other Ambulatory Visit: Payer: Self-pay

## 2023-05-27 ENCOUNTER — Other Ambulatory Visit (HOSPITAL_COMMUNITY): Payer: Self-pay

## 2023-05-27 ENCOUNTER — Ambulatory Visit: Payer: No Typology Code available for payment source | Admitting: Physical Therapy

## 2023-05-28 ENCOUNTER — Other Ambulatory Visit (HOSPITAL_COMMUNITY): Payer: Self-pay

## 2023-05-28 ENCOUNTER — Telehealth: Payer: Self-pay

## 2023-05-28 NOTE — Telephone Encounter (Signed)
Pharmacy Patient Advocate Encounter   Received notification from  [facility] Portal that prior authorization for [facility] 2.5MG /0.5ML auto-injectors is required/requested.   Insurance verification completed.   The patient is insured through  [employer]  .   Per test claim: PA required; PA submitted to above mentioned insurance via CoverMyMeds Key/confirmation #/EOC [id number] Status is pending

## 2023-06-02 NOTE — Telephone Encounter (Signed)
 Pharmacy Patient Advocate Encounter  Received notification from  [facility]  that Prior Authorization for [facility] 2.5MG /0.5ML auto-injectors has been DENIED.  Full denial letter will be uploaded to the media tab. See denial reason below.  We denied the medical services/items listed above because:  Glucagon-Like Peptide-1 Receptor Agonist (GLP-1) Step Therapy criteria has not been met. To meet our criteria:  You have tried and failed metformin at the maximally tolerated dose for a minimum of 3 months  PA #/Case ID/Reference #: [id number]

## 2023-06-08 ENCOUNTER — Ambulatory Visit (INDEPENDENT_AMBULATORY_CARE_PROVIDER_SITE_OTHER): Payer: No Typology Code available for payment source | Admitting: Physical Therapy

## 2023-06-08 DIAGNOSIS — M6281 Muscle weakness (generalized): Secondary | ICD-10-CM

## 2023-06-08 NOTE — Telephone Encounter (Signed)
 Sent mychart message notifying patient.

## 2023-06-08 NOTE — Therapy (Unsigned)
 OUTPATIENT PHYSICAL THERAPY LOWER EXTREMITY EVALUATION   Patient Name: Christine Eaton MRN: [id number] DOB:Dec 01, 1960, 63 y.o., female Today's Date: 06/08/2023  END OF SESSION:   Past Medical History:  Diagnosis Date   Anxiety    Arthritis May 2023   Back pain    Candidiasis of vagina 08/12/2017   Chest pain 04/28/2021   Depression    Fibroids    H/O   Ganglion of right hand 05/21/2015   Hallux valgus 11/04/2021   Hypertension    states under control with meds., has been on med. > 15 yr.   Infertility, female    Knee pain    Mass of foot 11/04/2021   Mass of right hand 09/2015   Neck pain    Pain in left foot 10/28/2021   Pelvic pain in female    H/O   Plantar fasciitis    hx   Prediabetes 04/28/2021   No results found for: "HGBA1C"         Sleep apnea July 2020   Snoring 08/23/2019   Past Surgical History:  Procedure Laterality Date   ABDOMINAL HYSTERECTOMY  In 2008   BREAST EXCISIONAL BIOPSY Right 2005   BREAST MASS EXCISION Right 08/23/2003   BREAST SURGERY  In 1996   Biopsy   CHROMOPERTUBATION  04/15/2001   COLONOSCOPY  06/25/2011   Procedure: COLONOSCOPY;  Surgeon: Charolett Bumpers, MD;  Location: [facility];  Service: Endoscopy;  Laterality: N/A;  MAC   COLONOSCOPY  2023   EXCISION OF ENDOMETRIOMA Right 04/15/2001   right tube   EYE SURGERY Right June 02, 2022   HERNIA REPAIR  In 1989   INGUINAL HERNIA REPAIR Left    KNEE ARTHROSCOPY  02/02/2012   Procedure: ARTHROSCOPY KNEE;  Surgeon: Nilda Simmer, MD;  Location: [facility];  Service: Orthopedics;  Laterality: Left;  ARTHROSCOPY KNEE WITH DEBRIDEMENT/SHAVING (CHONDROPLASTY)   LAPAROSCOPIC LYSIS OF ADHESIONS  04/15/2001; 05/24/2007   MASS EXCISION Right 10/02/2015   Procedure: EXCISION OF RIGHT HAND  MASS;  Surgeon: Dairl Ponder, MD;  Location: [facility];  Service: Orthopedics;  Laterality: Right;   REPAIR VAGINAL CUFF  06/18/2007   ROBOTIC ASSISTED TOTAL  HYSTERECTOMY WITH BILATERAL SALPINGO OOPHERECTOMY  05/24/2007   TOE SURGERY Left 2021   great toe   Patient Active Problem List   Diagnosis Date Noted   Diabetes mellitus type 2, uncomplicated (HCC) 05/23/2023   Irregular bowel habits 11/16/2022   History of colon polyps 07/05/2022   Hyperlipidemia 07/05/2022   Work-related stress 07/01/2022   Pain in joint of left knee 08/28/2021   Severe obstructive sleep apnea-hypopnea syndrome 04/17/2020   Hypokalemia due to loss of potassium 08/23/2019   Morbid obesity (HCC) 08/23/2019   Chronic back pain 08/12/2017   Herpes simplex 08/12/2017   Obesity 08/12/2017   Hypertension    Fibroadenoma of right breast        PCP: ***  REFERRING PROVIDER: ***  REFERRING DIAG: ***  THERAPY DIAG:  No diagnosis found.  Rationale for Evaluation and Treatment: Rehabilitation  ONSET DATE:  ***    SUBJECTIVE:   SUBJECTIVE STATEMENT: Previous x-ray in 2023. Meniscal repair 2013. Thought she made good recovery.  Now: no pain. No current exercise.  Starting walking- due to weight gain.  No other pain.  Steps 1 step to enter outside no handrail. - not a problem.     Does have computer room upstairs, goes up occasionally, with 1 hand rail. More difficult  with multiple steps.   PERTINENT HISTORY: HTN, DM, hysterectomy,   PAIN:  Are you having pain? Yes: NPRS scale: 0/10 Pain location: *** Pain description: *** Aggravating factors: Getting up from chair,   going up stairs.  Relieving factors: ***   PRECAUTIONS: {Therapy precautions:24002}   WEIGHT BEARING RESTRICTIONS: No   FALLS:  Has patient fallen in last 6 months? No   PLOF: Independent  PATIENT GOALS:  Improved strength of L knee/leg   NEXT MD VISIT:   OBJECTIVE:   DIAGNOSTIC FINDINGS:   PATIENT SURVEYS:     COGNITION: Overall cognitive status: Within functional limits for tasks assessed     SENSATION: WFL  EDEMA:    POSTURE:    No Significant postural  limitations  PALPATION:  LOWER EXTREMITY ROM:  Active /Passive ROM Left eval Right eval  Hip flexion    Hip extension    Hip abduction    Hip adduction    Hip internal rotation    Hip external rotation    Knee flexion    Knee extension    Ankle dorsiflexion    Ankle plantarflexion    Ankle inversion    Ankle eversion     (Blank rows = not tested)  LOWER EXTREMITY MMT:  MMT Left eval Right  eval  Hip flexion    Hip extension    Hip abduction    Hip adduction    Hip internal rotation    Hip external rotation    Knee flexion    Knee extension    Ankle dorsiflexion    Ankle plantarflexion    Ankle inversion    Ankle eversion     (Blank rows = not tested)  LOWER EXTREMITY SPECIAL TESTS:  {LEspecialtests:26242}  FUNCTIONAL TESTS:  {Functional tests:24029}  GAIT: Distance walked: *** Assistive device utilized: {Assistive devices:23999} Level of assistance: {Levels of assistance:24026} Comments: ***   TODAY'S TREATMENT:                                                                                                                              DATE:     PATIENT EDUCATION:  Education details: PT POC, Exam findings, HEP Person educated: Patient Education method: Programmer, multimedia, Demonstration, Actor cues, Verbal cues, and Handouts Education comprehension: verbalized understanding, returned demonstration, verbal cues required, tactile cues required, and needs further education   HOME EXERCISE PROGRAM: ***  ASSESSMENT:  CLINICAL IMPRESSION: Patient presents with primary complaint of  ***   Pt with decreased ability for full functional activities. Pt will  benefit from skilled PT to improve deficits and pain and to return to PLOF.   OBJECTIVE IMPAIRMENTS: {opptimpairments:25111}.   ACTIVITY LIMITATIONS: {activitylimitations:27494}  PARTICIPATION LIMITATIONS: {participationrestrictions:25113}  PERSONAL FACTORS: {Personal factors:25162} are also  affecting patient's functional outcome.   REHAB POTENTIAL: {rehabpotential:25112}  CLINICAL DECISION MAKING: {clinical decision making:25114}  EVALUATION COMPLEXITY: {Evaluation complexity:25115}   GOALS: Goals reviewed with patient? Yes  SHORT TERM GOALS: Target date: ***  ***  Goal status: INITIAL  2.  ***  Goal status: INITIAL  3.  ***  Goal status: INITIAL    LONG TERM GOALS: Target date: ***  ***  Goal status: INITIAL  2.  ***  Goal status: INITIAL  3.  ***  Goal status: INITIAL  4.  *** Baseline:  Goal status: INITIAL  5.  ***  Goal status: INITIAL     PLAN:  PT FREQUENCY: {rehab frequency:25116}  PT DURATION: {rehab duration:25117}  PLANNED INTERVENTIONS: Therapeutic exercises, Therapeutic activity, Neuromuscular re-education, Patient/Family education, Self Care, Joint mobilization, Joint manipulation, Stair training, Orthotic/Fit training, DME instructions, Aquatic Therapy, Dry Needling, Electrical stimulation, Cryotherapy, Moist heat, Taping, Ultrasound, Ionotophoresis 4mg /ml Dexamethasone, Manual therapy,  Vasopneumatic device, Traction, Spinal manipulation, Spinal mobilization,Balance training, Gait training,   PLAN FOR NEXT SESSION:    Sedalia Muta, PT 09/15/2022, 9:15 AM

## 2023-06-09 ENCOUNTER — Encounter: Payer: Self-pay | Admitting: Physical Therapy

## 2023-06-23 ENCOUNTER — Encounter: Payer: Self-pay | Admitting: Physical Therapy

## 2023-06-23 ENCOUNTER — Ambulatory Visit (INDEPENDENT_AMBULATORY_CARE_PROVIDER_SITE_OTHER): Admitting: Physical Therapy

## 2023-06-23 DIAGNOSIS — R262 Difficulty in walking, not elsewhere classified: Secondary | ICD-10-CM | POA: Diagnosis not present

## 2023-06-23 DIAGNOSIS — G8929 Other chronic pain: Secondary | ICD-10-CM

## 2023-06-23 DIAGNOSIS — M6281 Muscle weakness (generalized): Secondary | ICD-10-CM

## 2023-06-23 DIAGNOSIS — M25562 Pain in left knee: Secondary | ICD-10-CM

## 2023-06-23 NOTE — Therapy (Signed)
 OUTPATIENT PHYSICAL THERAPY LOWER EXTREMITY TREATMENT   Patient Name: Christine Eaton MRN: [id number] DOB:Jun 23, 1960, 63 y.o., female Today's Date: 06/23/2023  END OF SESSION:  PT End of Session - 06/23/23 1109     Visit Number 2    Number of Visits 16    Date for PT Re-Evaluation 08/03/23    Authorization Type [employer]    PT Start Time 1109    PT Stop Time 1147    PT Time Calculation (min) 38 min    Activity Tolerance Patient tolerated treatment well    Behavior During Therapy [facility] for tasks assessed/performed             Past Medical History:  Diagnosis Date   Anxiety    Arthritis May 2023   Back pain    Candidiasis of vagina 08/12/2017   Chest pain 04/28/2021   Depression    Fibroids    H/O   Ganglion of right hand 05/21/2015   Hallux valgus 11/04/2021   Hypertension    states under control with meds., has been on med. > 15 yr.   Infertility, female    Knee pain    Mass of foot 11/04/2021   Mass of right hand 09/2015   Neck pain    Pain in left foot 10/28/2021   Pelvic pain in female    H/O   Plantar fasciitis    hx   Prediabetes 04/28/2021   No results found for: "HGBA1C"         Sleep apnea July 2020   Snoring 08/23/2019   Past Surgical History:  Procedure Laterality Date   ABDOMINAL HYSTERECTOMY  In 2008   BREAST EXCISIONAL BIOPSY Right 2005   BREAST MASS EXCISION Right 08/23/2003   BREAST SURGERY  In 1996   Biopsy   CHROMOPERTUBATION  04/15/2001   COLONOSCOPY  06/25/2011   Procedure: COLONOSCOPY;  Surgeon: Charolett Bumpers, MD;  Location: [facility];  Service: Endoscopy;  Laterality: N/A;  MAC   COLONOSCOPY  2023   EXCISION OF ENDOMETRIOMA Right 04/15/2001   right tube   EYE SURGERY Right June 02, 2022   HERNIA REPAIR  In 1989   INGUINAL HERNIA REPAIR Left    KNEE ARTHROSCOPY  02/02/2012   Procedure: ARTHROSCOPY KNEE;  Surgeon: Nilda Simmer, MD;  Location: [facility];  Service: Orthopedics;  Laterality: Left;   ARTHROSCOPY KNEE WITH DEBRIDEMENT/SHAVING (CHONDROPLASTY)   LAPAROSCOPIC LYSIS OF ADHESIONS  04/15/2001; 05/24/2007   MASS EXCISION Right 10/02/2015   Procedure: EXCISION OF RIGHT HAND  MASS;  Surgeon: Dairl Ponder, MD;  Location: [facility];  Service: Orthopedics;  Laterality: Right;   REPAIR VAGINAL CUFF  06/18/2007   ROBOTIC ASSISTED TOTAL HYSTERECTOMY WITH BILATERAL SALPINGO OOPHERECTOMY  05/24/2007   TOE SURGERY Left 2021   great toe   Patient Active Problem List   Diagnosis Date Noted   Diabetes mellitus type 2, uncomplicated (HCC) 05/23/2023   Irregular bowel habits 11/16/2022   History of colon polyps 07/05/2022   Hyperlipidemia 07/05/2022   Work-related stress 07/01/2022   Pain in joint of left knee 08/28/2021   Severe obstructive sleep apnea-hypopnea syndrome 04/17/2020   Hypokalemia due to loss of potassium 08/23/2019   Morbid obesity (HCC) 08/23/2019   Chronic back pain 08/12/2017   Herpes simplex 08/12/2017   Obesity 08/12/2017   Hypertension    Fibroadenoma of right breast     PCP: Glenetta Hew  REFERRING PROVIDER: Glenetta Hew  REFERRING DIAG: Leg weakness  THERAPY DIAG:  Muscle weakness (generalized)  Difficulty in walking, not elsewhere classified  Chronic pain of left knee  Rationale for Evaluation and Treatment: Rehabilitation  ONSET DATE:     SUBJECTIVE:   SUBJECTIVE STATEMENT: Pt states mild improvements in knee/leg weakness feeling. Has been doing HEP.   Eval: Pt states L knee and leg feel weak. She did have Meniscal repair 2013. Thought she made good recovery. She also had x-ray in 2023, with findings for OA-  Moderate medial compartment and mild patellofemoral compartment osteoarthritis. She reports no pain at this time, but weakness feeling and feels like her leg may give out on stairs. Stairs difficulty and getting up/out of chair.  She is not currently exercising, but  Starting walking- due to weight gain, would like  to lose weight.  She has 1 step to enter outside no handrail. - states not a problem.    Does have computer room upstairs, goes up occasionally, with 1 hand rail. More difficult with multiple steps/full flight .   PERTINENT HISTORY: HTN, DM, hysterectomy,   PAIN:  Are you having pain? No : NPRS scale: 0/10   PRECAUTIONS: None   WEIGHT BEARING RESTRICTIONS: No   FALLS:  Has patient fallen in last 6 months? No   PLOF: Independent  PATIENT GOALS:  Improved strength of L knee/leg   NEXT MD VISIT:   OBJECTIVE:   DIAGNOSTIC FINDINGS:   IMPRESSION: Moderate medial compartment and mild patellofemoral compartment osteoarthritis.  PATIENT SURVEYS:     COGNITION: Overall cognitive status: Within functional limits for tasks assessed     SENSATION: WFL  EDEMA:    POSTURE:    No Significant postural limitations  PALPATION:   LOWER EXTREMITY ROM:  Hips: WFL Knees: R: WFL,   L: mild limitation for flexion, ext: WFL  LOWER EXTREMITY MMT:  MMT Left eval Right  eval  Hip flexion 4 4  Hip extension    Hip abduction 4 4  Hip adduction    Hip internal rotation    Hip external rotation    Knee flexion 4 4+  Knee extension 4 4+  Ankle dorsiflexion    Ankle plantarflexion    Ankle inversion    Ankle eversion     (Blank rows = not tested)  LOWER EXTREMITY SPECIAL TESTS:    FUNCTIONAL TESTS:    GAIT:    TODAY'S TREATMENT:                                                                                                                              DATE:   06/23/2023 Therapeutic Exercise: Aerobic: Supine:  SLR 2 x 10 bil;     Seated:  LAQ x 10 bil; 2.5 lb x 10 bil (inc to 3 next time)  Standing: March x 15;  HR x 15;   Stretches:  Neuromuscular Re-education: Manual Therapy:  L knee mobs to increase extension;  Therapeutic Activity:  Step ups 4  in x 10 bil;    Sit to stand higher mat table x 10,  lower table, with offset foot position x 5;  squats at  mat table x 10;  Self Care:  3/4: ther ex: See below for HEP  PATIENT EDUCATION:  Education details: updated and reviewed HEP  Person educated: Patient Education method: Explanation, Demonstration, Tactile cues, Verbal cues, and Handouts Education comprehension: verbalized understanding, returned demonstration, verbal cues required, tactile cues required, and needs further education   HOME EXERCISE PROGRAM: Access Code: [facility] URL: [email or web address] Date: 06/09/2023 Prepared by: Sedalia Muta  Exercises - Straight Leg Raise  - 1 x daily - 1-2 sets - 5-10 reps - Sidelying Hip Abduction  - 1 x daily - 1-2 sets - 10 reps - Seated Knee Extension AROM  - 1 x daily - 2 sets - 10 reps   ASSESSMENT:  CLINICAL IMPRESSION: Pt with good tolerance for activities today, mild soreness in lateral knee. She does have difficulty with knee extension in standing. Noted weakness and instability in L vs R with exercise and Marching today. Plan to progress strength as able.   Eval: Patient presents with primary complaint of weakness in L knee. She has only mild flexion Rom limitations, but does have decreased strength and stability in L knee and hip. Instability symptoms likely stemming from OA. She has decreased ability and confidence with transfers, bending, squatting and stairs.  Pt with decreased ability for full functional activities. Pt will  benefit from skilled PT to improve deficits and pain and to return to PLOF.   OBJECTIVE IMPAIRMENTS: Abnormal gait, decreased activity tolerance, decreased knowledge of use of DME, decreased mobility, decreased ROM, decreased strength, impaired flexibility, improper body mechanics, and pain.   ACTIVITY LIMITATIONS: bending, standing, squatting, stairs, transfers, and locomotion level  PARTICIPATION LIMITATIONS: meal prep, cleaning, shopping, community activity, and yard work  PERSONAL FACTORS: Time since onset of  injury/illness/exacerbation are also affecting patient's functional outcome.   REHAB POTENTIAL: Good  CLINICAL DECISION MAKING: Stable/uncomplicated  EVALUATION COMPLEXITY: Low   GOALS: Goals reviewed with patient? Yes   SHORT TERM GOALS: Target date: 06/22/2023   Pt to be independent with initial HEP  Goal status: INITIAL  2. Pt to demo optimal mechanics for sit to stand from regular chair with light use of hands.    LONG TERM GOALS: Target date: 08/03/2023  Pt to be independent with final HEP  Goal status: INITIAL  2.  Pt to demo max strength in L LE for improved ability for transfers, stairs, and gait.   Goal status: INITIAL  3.  Pt to demo ability and optimal mechanics for stairs, reciprocal, with light UE support, to improve community navigation.   Goal status: INITIAL  4.  Pt to report at least 75 % improvement in overall symptoms.   Goal status: INITIAL   PLAN:  PT FREQUENCY: 1-2x/week  PT DURATION: 8 weeks  PLANNED INTERVENTIONS: Therapeutic exercises, Therapeutic activity, Neuromuscular re-education, Patient/Family education, Self Care, Joint mobilization, Joint manipulation, Stair training, Orthotic/Fit training, DME instructions, Aquatic Therapy, Dry Needling, Electrical stimulation, Cryotherapy, Moist heat, Taping, Ultrasound, Ionotophoresis 4mg /ml Dexamethasone, Manual therapy,  Vasopneumatic device, Traction, Spinal manipulation, Spinal mobilization,Balance training, Gait training,   PLAN FOR NEXT SESSION:    Sedalia Muta, PT, DPT 11:10 AM  06/23/23

## 2023-06-28 ENCOUNTER — Encounter: Admitting: Physical Therapy

## 2023-07-01 ENCOUNTER — Other Ambulatory Visit (HOSPITAL_COMMUNITY): Payer: Self-pay

## 2023-07-01 MED ORDER — AMOXICILLIN 500 MG PO CAPS
500.0000 mg | ORAL_CAPSULE | Freq: Three times a day (TID) | ORAL | 0 refills | Status: AC
  Filled 2023-07-01: qty 21, 7d supply, fill #0

## 2023-07-07 ENCOUNTER — Encounter: Admitting: Physical Therapy

## 2023-07-12 ENCOUNTER — Encounter: Payer: Self-pay | Admitting: Physical Therapy

## 2023-07-12 ENCOUNTER — Ambulatory Visit (INDEPENDENT_AMBULATORY_CARE_PROVIDER_SITE_OTHER): Admitting: Physical Therapy

## 2023-07-12 DIAGNOSIS — M25562 Pain in left knee: Secondary | ICD-10-CM | POA: Diagnosis not present

## 2023-07-12 DIAGNOSIS — M6281 Muscle weakness (generalized): Secondary | ICD-10-CM | POA: Diagnosis not present

## 2023-07-12 DIAGNOSIS — R262 Difficulty in walking, not elsewhere classified: Secondary | ICD-10-CM | POA: Diagnosis not present

## 2023-07-12 DIAGNOSIS — G8929 Other chronic pain: Secondary | ICD-10-CM

## 2023-07-12 NOTE — Therapy (Signed)
 OUTPATIENT PHYSICAL THERAPY LOWER EXTREMITY TREATMENT   Patient Name: Christine Eaton MRN: [id number] DOB:[birth year], 63 y.o., female Today's Date: 07/12/2023  END OF SESSION:  PT End of Session - 07/12/23 1223     Visit Number 3    Number of Visits 16    Date for PT Re-Evaluation 08/03/23    Authorization Type [employer]    PT Start Time 1224    PT Stop Time 1302    PT Time Calculation (min) 38 min    Activity Tolerance Patient tolerated treatment well    Behavior During Therapy [facility] for tasks assessed/performed             Past Medical History:  Diagnosis Date   Anxiety    Arthritis May 2023   Back pain    Candidiasis of vagina 08/12/2017   Chest pain 04/28/2021   Depression    Fibroids    H/O   Ganglion of right hand 05/21/2015   Hallux valgus 11/04/2021   Hypertension    states under control with meds., has been on med. > 15 yr.   Infertility, female    Knee pain    Mass of foot 11/04/2021   Mass of right hand 09/2015   Neck pain    Pain in left foot 10/28/2021   Pelvic pain in female    H/O   Plantar fasciitis    hx   Prediabetes 04/28/2021   No results found for: "HGBA1C"         Sleep apnea Eaton 2020   Snoring 08/23/2019   Past Surgical History:  Procedure Laterality Date   ABDOMINAL HYSTERECTOMY  In 2008   BREAST EXCISIONAL BIOPSY Right 2005   BREAST MASS EXCISION Right 08/23/2003   BREAST SURGERY  In 1996   Biopsy   CHROMOPERTUBATION  04/15/2001   COLONOSCOPY  06/25/2011   Procedure: COLONOSCOPY;  Surgeon: Charolett Bumpers, MD;  Location: [facility];  Service: Endoscopy;  Laterality: N/A;  MAC   COLONOSCOPY  2023   EXCISION OF ENDOMETRIOMA Right 04/15/2001   right tube   EYE SURGERY Right June 02, 2022   HERNIA REPAIR  In 1989   INGUINAL HERNIA REPAIR Left    KNEE ARTHROSCOPY  02/02/2012   Procedure: ARTHROSCOPY KNEE;  Surgeon: Nilda Simmer, MD;  Location: [facility];  Service: Orthopedics;  Laterality: Left;   ARTHROSCOPY KNEE WITH DEBRIDEMENT/SHAVING (CHONDROPLASTY)   LAPAROSCOPIC LYSIS OF ADHESIONS  04/15/2001; 05/24/2007   MASS EXCISION Right 10/02/2015   Procedure: EXCISION OF RIGHT HAND  MASS;  Surgeon: Dairl Ponder, MD;  Location: [facility];  Service: Orthopedics;  Laterality: Right;   REPAIR VAGINAL CUFF  06/18/2007   ROBOTIC ASSISTED TOTAL HYSTERECTOMY WITH BILATERAL SALPINGO OOPHERECTOMY  05/24/2007   TOE SURGERY Left 2021   great toe   Patient Active Problem List   Diagnosis Date Noted   Diabetes mellitus type 2, uncomplicated (HCC) 05/23/2023   Irregular bowel habits 11/16/2022   History of colon polyps 07/05/2022   Hyperlipidemia 07/05/2022   Work-related stress 07/01/2022   Pain in joint of left knee 08/28/2021   Severe obstructive sleep apnea-hypopnea syndrome 04/17/2020   Hypokalemia due to loss of potassium 08/23/2019   Morbid obesity (HCC) 08/23/2019   Chronic back pain 08/12/2017   Herpes simplex 08/12/2017   Obesity 08/12/2017   Hypertension    Fibroadenoma of right breast     PCP: Glenetta Hew  REFERRING PROVIDER: Glenetta Hew  REFERRING DIAG: Leg weakness  THERAPY DIAG:  Muscle weakness (generalized)  Difficulty in walking, not elsewhere classified  Chronic pain of left knee  Rationale for Evaluation and Treatment: Rehabilitation  ONSET DATE:     SUBJECTIVE:   SUBJECTIVE STATEMENT: Pt states mild improvements in knee/leg weakness feeling. Has been doing HEP. Still feels instability at times, feels like knee gives way on her still about 1x/mo.   Eval: Pt states L knee and leg feel weak. She did have Meniscal repair 2013. Thought she made good recovery. She also had x-ray in 2023, with findings for OA-  Moderate medial compartment and mild patellofemoral compartment osteoarthritis. She reports no pain at this time, but weakness feeling and feels like her leg may give out on stairs. Stairs difficulty and getting up/out of chair.   She is not currently exercising, but  Starting walking- due to weight gain, would like to lose weight.  She has 1 step to enter outside no handrail. - states not a problem.    Does have computer room upstairs, goes up occasionally, with 1 hand rail. More difficult with multiple steps/full flight .   PERTINENT HISTORY: HTN, DM, hysterectomy,   PAIN:  Are you having pain? No : NPRS scale: 0/10   PRECAUTIONS: None   WEIGHT BEARING RESTRICTIONS: No   FALLS:  Has patient fallen in last 6 months? No   PLOF: Independent  PATIENT GOALS:  Improved strength of L knee/leg   NEXT MD VISIT:   OBJECTIVE:   DIAGNOSTIC FINDINGS:   IMPRESSION: Moderate medial compartment and mild patellofemoral compartment osteoarthritis.  PATIENT SURVEYS:     COGNITION: Overall cognitive status: Within functional limits for tasks assessed     SENSATION: WFL  EDEMA:    POSTURE:    No Significant postural limitations  PALPATION:   LOWER EXTREMITY ROM:  Hips: WFL Knees: R: WFL,   L: mild limitation for flexion, ext: WFL  LOWER EXTREMITY MMT:  MMT Left eval Right  eval  Hip flexion 4 4  Hip extension    Hip abduction 4 4  Hip adduction    Hip internal rotation    Hip external rotation    Knee flexion 4 4+  Knee extension 4 4+  Ankle dorsiflexion    Ankle plantarflexion    Ankle inversion    Ankle eversion     (Blank rows = not tested)  LOWER EXTREMITY SPECIAL TESTS:    FUNCTIONAL TESTS:    GAIT:    TODAY'S TREATMENT:                                                                                                                              DATE:   07/12/2023 Therapeutic Exercise: Aerobic:  Supine:  SLR  2 x 10 bil;    bridging x 10, then x 5   S/L:  hip abd 2 x 10 bil;  Seated:  LAQ x 10 bil; 3 lb 2 x 10 bil Standing: tandem  stance 30 sec x 2 bil;  Stretches:  Neuromuscular Re-education:  Manual Therapy:  Therapeutic Activity:  Step ups 4 in x 10 bil;    March x 15;    Self Care:   PATIENT EDUCATION:  Education details: updated and reviewed HEP  Person educated: Patient Education method: Explanation, Demonstration, Tactile cues, Verbal cues, and Handouts Education comprehension: verbalized understanding, returned demonstration, verbal cues required, tactile cues required, and needs further education   HOME EXERCISE PROGRAM: Access Code: [employer] URL: [email or web address] Date: 06/09/2023 Prepared by: Sedalia Muta  Exercises - Straight Leg Raise  - 1 x daily - 1-2 sets - 5-10 reps - Sidelying Hip Abduction  - 1 x daily - 1-2 sets - 10 reps - Seated Knee Extension AROM  - 1 x daily - 2 sets - 10 reps   ASSESSMENT:  CLINICAL IMPRESSION: Pt with good tolerance for activities today, no pain. She is challenged with step ups and SLR due to weakness, as well as Tandem stance for instability. She has decreased confidence with stairs, balance and functional activity stemming from knee. Will continue to benefit from strengthening and stabilization as tolerated for improving confidence and safety with activity. Discussed recommendations for continued care, and for increased frequency of PT if able.   Eval: Patient presents with primary complaint of weakness in L knee. She has only mild flexion Rom limitations, but does have decreased strength and stability in L knee and hip. Instability symptoms likely stemming from OA. She has decreased ability and confidence with transfers, bending, squatting and stairs.  Pt with decreased ability for full functional activities. Pt will  benefit from skilled PT to improve deficits and pain and to return to PLOF.   OBJECTIVE IMPAIRMENTS: Abnormal gait, decreased activity tolerance, decreased knowledge of use of DME, decreased mobility, decreased ROM, decreased strength, impaired flexibility, improper body mechanics, and pain.   ACTIVITY LIMITATIONS: bending, standing, squatting, stairs,  transfers, and locomotion level  PARTICIPATION LIMITATIONS: meal prep, cleaning, shopping, community activity, and yard work  PERSONAL FACTORS: Time since onset of injury/illness/exacerbation are also affecting patient's functional outcome.   REHAB POTENTIAL: Good  CLINICAL DECISION MAKING: Stable/uncomplicated  EVALUATION COMPLEXITY: Low   GOALS: Goals reviewed with patient? Yes   SHORT TERM GOALS: Target date: 06/22/2023   Pt to be independent with initial HEP  Goal status: INITIAL  2. Pt to demo optimal mechanics for sit to stand from regular chair with light use of hands.    LONG TERM GOALS: Target date: 08/03/2023  Pt to be independent with final HEP  Goal status: INITIAL  2.  Pt to demo max strength in L LE for improved ability for transfers, stairs, and gait.   Goal status: INITIAL  3.  Pt to demo ability and optimal mechanics for stairs, reciprocal, with light UE support, to improve community navigation.   Goal status: INITIAL  4.  Pt to report at least 75 % improvement in overall symptoms.   Goal status: INITIAL   PLAN:  PT FREQUENCY: 1-2x/week  PT DURATION: 8 weeks  PLANNED INTERVENTIONS: Therapeutic exercises, Therapeutic activity, Neuromuscular re-education, Patient/Family education, Self Care, Joint mobilization, Joint manipulation, Stair training, Orthotic/Fit training, DME instructions, Aquatic Therapy, Dry Needling, Electrical stimulation, Cryotherapy, Moist heat, Taping, Ultrasound, Ionotophoresis 4mg /ml Dexamethasone, Manual therapy,  Vasopneumatic device, Traction, Spinal manipulation, Spinal mobilization,Balance training, Gait training,   PLAN FOR NEXT SESSION: marching, stairs, step ups, toe taps, balance.    Sedalia Muta, PT, DPT 2:39 PM  07/12/23

## 2023-07-13 ENCOUNTER — Other Ambulatory Visit (HOSPITAL_COMMUNITY): Payer: Self-pay

## 2023-07-13 ENCOUNTER — Ambulatory Visit
Admission: RE | Admit: 2023-07-13 | Discharge: 2023-07-13 | Disposition: A | Discharge status: Home / Self Care | Source: Ambulatory Visit | Attending: Physician Assistant | Admitting: Physician Assistant

## 2023-07-13 ENCOUNTER — Other Ambulatory Visit: Payer: Self-pay | Admitting: Physician Assistant

## 2023-07-13 DIAGNOSIS — Y990 Civilian activity done for income or pay: Secondary | ICD-10-CM

## 2023-07-13 DIAGNOSIS — Z043 Encounter for examination and observation following other accident: Secondary | ICD-10-CM | POA: Insufficient documentation

## 2023-07-13 DIAGNOSIS — S59902A Unspecified injury of left elbow, initial encounter: Secondary | ICD-10-CM | POA: Insufficient documentation

## 2023-07-13 DIAGNOSIS — S8991XA Unspecified injury of right lower leg, initial encounter: Secondary | ICD-10-CM | POA: Insufficient documentation

## 2023-07-13 DIAGNOSIS — W1830XA Fall on same level, unspecified, initial encounter: Secondary | ICD-10-CM | POA: Insufficient documentation

## 2023-07-13 MED ORDER — VALACYCLOVIR HCL 1 G PO TABS
1000.0000 mg | ORAL_TABLET | Freq: Every day | ORAL | 3 refills | Status: AC
  Filled 2023-07-13 – 2023-07-20 (×2): qty 30, 30d supply, fill #0

## 2023-07-14 ENCOUNTER — Encounter: Payer: Self-pay | Admitting: Physical Therapy

## 2023-07-14 ENCOUNTER — Ambulatory Visit (INDEPENDENT_AMBULATORY_CARE_PROVIDER_SITE_OTHER): Admitting: Physical Therapy

## 2023-07-14 DIAGNOSIS — M25562 Pain in left knee: Secondary | ICD-10-CM

## 2023-07-14 DIAGNOSIS — G8929 Other chronic pain: Secondary | ICD-10-CM | POA: Diagnosis not present

## 2023-07-14 DIAGNOSIS — R262 Difficulty in walking, not elsewhere classified: Secondary | ICD-10-CM | POA: Diagnosis not present

## 2023-07-14 DIAGNOSIS — M6281 Muscle weakness (generalized): Secondary | ICD-10-CM | POA: Diagnosis not present

## 2023-07-14 NOTE — Therapy (Signed)
 OUTPATIENT PHYSICAL THERAPY LOWER EXTREMITY TREATMENT   Patient Name: Christine Eaton MRN: [id number] DOB:1960-05-01, 63 y.o., female Today's Date: 07/14/2023   END OF SESSION:  PT End of Session - 07/14/23 1117     Visit Number 4    Number of Visits 16    Date for PT Re-Evaluation 08/03/23    Authorization Type [employer]    PT Start Time 1106    PT Stop Time 1145    PT Time Calculation (min) 39 min    Activity Tolerance Patient tolerated treatment well    Behavior During Therapy [facility] for tasks assessed/performed              Past Medical History:  Diagnosis Date   Anxiety    Arthritis May 2023   Back pain    Candidiasis of vagina 08/12/2017   Chest pain 04/28/2021   Depression    Fibroids    H/O   Ganglion of right hand 05/21/2015   Hallux valgus 11/04/2021   Hypertension    states under control with meds., has been on med. > 15 yr.   Infertility, female    Knee pain    Mass of foot 11/04/2021   Mass of right hand 09/2015   Neck pain    Pain in left foot 10/28/2021   Pelvic pain in female    H/O   Plantar fasciitis    hx   Prediabetes 04/28/2021   No results found for: "HGBA1C"         Sleep apnea July 2020   Snoring 08/23/2019   Past Surgical History:  Procedure Laterality Date   ABDOMINAL HYSTERECTOMY  In 2008   BREAST EXCISIONAL BIOPSY Right 2005   BREAST MASS EXCISION Right 08/23/2003   BREAST SURGERY  In 1996   Biopsy   CHROMOPERTUBATION  04/15/2001   COLONOSCOPY  06/25/2011   Procedure: COLONOSCOPY;  Surgeon: Charolett Bumpers, MD;  Location: [facility];  Service: Endoscopy;  Laterality: N/A;  MAC   COLONOSCOPY  2023   EXCISION OF ENDOMETRIOMA Right 04/15/2001   right tube   EYE SURGERY Right June 02, 2022   HERNIA REPAIR  In 1989   INGUINAL HERNIA REPAIR Left    KNEE ARTHROSCOPY  02/02/2012   Procedure: ARTHROSCOPY KNEE;  Surgeon: Nilda Simmer, MD;  Location: [facility];  Service: Orthopedics;  Laterality: Left;   ARTHROSCOPY KNEE WITH DEBRIDEMENT/SHAVING (CHONDROPLASTY)   LAPAROSCOPIC LYSIS OF ADHESIONS  04/15/2001; 05/24/2007   MASS EXCISION Right 10/02/2015   Procedure: EXCISION OF RIGHT HAND  MASS;  Surgeon: Dairl Ponder, MD;  Location: [facility];  Service: Orthopedics;  Laterality: Right;   REPAIR VAGINAL CUFF  06/18/2007   ROBOTIC ASSISTED TOTAL HYSTERECTOMY WITH BILATERAL SALPINGO OOPHERECTOMY  05/24/2007   TOE SURGERY Left 2021   great toe   Patient Active Problem List   Diagnosis Date Noted   Diabetes mellitus type 2, uncomplicated (HCC) 05/23/2023   Irregular bowel habits 11/16/2022   History of colon polyps 07/05/2022   Hyperlipidemia 07/05/2022   Work-related stress 07/01/2022   Pain in joint of left knee 08/28/2021   Severe obstructive sleep apnea-hypopnea syndrome 04/17/2020   Hypokalemia due to loss of potassium 08/23/2019   Morbid obesity (HCC) 08/23/2019   Chronic back pain 08/12/2017   Herpes simplex 08/12/2017   Obesity 08/12/2017   Hypertension    Fibroadenoma of right breast     PCP: Glenetta Hew  REFERRING PROVIDER: Glenetta Hew  REFERRING DIAG:  Leg weakness  THERAPY DIAG:  Muscle weakness (generalized)  Difficulty in walking, not elsewhere classified  Chronic pain of left knee  Rationale for Evaluation and Treatment: Rehabilitation  ONSET DATE:     SUBJECTIVE:   SUBJECTIVE STATEMENT: Pt fell while at work yesterday, was able to see employee health. States no injury, did fall onto R knee and R hand. R wrist/thumb still sore and R knee mildly sore.    Eval: Pt states L knee and leg feel weak. She did have Meniscal repair 2013. Thought she made good recovery. She also had x-ray in 2023, with findings for OA-  Moderate medial compartment and mild patellofemoral compartment osteoarthritis. She reports no pain at this time, but weakness feeling and feels like her leg may give out on stairs. Stairs difficulty and getting up/out of chair.   She is not currently exercising, but  Starting walking- due to weight gain, would like to lose weight.  She has 1 step to enter outside no handrail. - states not a problem.    Does have computer room upstairs, goes up occasionally, with 1 hand rail. More difficult with multiple steps/full flight .   PERTINENT HISTORY: HTN, DM, hysterectomy,   PAIN:  Are you having pain? No : NPRS scale: 0/10   PRECAUTIONS: None   WEIGHT BEARING RESTRICTIONS: No   FALLS:  Has patient fallen in last 6 months? No   PLOF: Independent  PATIENT GOALS:  Improved strength of L knee/leg   NEXT MD VISIT:   OBJECTIVE:   DIAGNOSTIC FINDINGS:   IMPRESSION: Moderate medial compartment and mild patellofemoral compartment osteoarthritis.  PATIENT SURVEYS:     COGNITION: Overall cognitive status: Within functional limits for tasks assessed     SENSATION: WFL  EDEMA:    POSTURE:    No Significant postural limitations  PALPATION:   LOWER EXTREMITY ROM:  Hips: WFL Knees: R: WFL,   L: mild limitation for flexion, ext: WFL  LOWER EXTREMITY MMT:  MMT Left eval Right  eval  Hip flexion 4 4  Hip extension    Hip abduction 4 4  Hip adduction    Hip internal rotation    Hip external rotation    Knee flexion 4 4+  Knee extension 4 4+  Ankle dorsiflexion    Ankle plantarflexion    Ankle inversion    Ankle eversion     (Blank rows = not tested)  LOWER EXTREMITY SPECIAL TESTS:    FUNCTIONAL TESTS:   GAIT:  TODAY'S TREATMENT:                                                                                                                              DATE:   07/14/2023 Therapeutic Exercise: Aerobic:  Supine:  SLR  2 x 10 bil;    S/L:  hip abd 2 x 10 bil;  Seated:  LAQ   3 lb 2 x 10 on L;  Standing: tandem stance  30 sec x 2 bil;  Stretches:  Neuromuscular Re-education:  Manual Therapy:  Therapeutic Activity:  Step ups 6 in x 10 bil;   Step over on 6 in step x 10 on R; bwd  walking x 6 in hallway, side stepping 10 ft x 6 at counter.   Self Care:   Therapeutic Exercise: Aerobic:  Supine:  SLR  2 x 10 bil;    bridging x 10, then x 5   S/L:  hip abd 2 x 10 bil;  Seated:  LAQ x 10 bil; 3 lb 2 x 10 bil Standing: tandem stance 30 sec x 2 bil;  Stretches:  Neuromuscular Re-education:  Manual Therapy:  Therapeutic Activity:  Step ups 4 in x 10 bil;   March x 15;    Self Care:   PATIENT EDUCATION:  Education details: updated and reviewed HEP  Person educated: Patient Education method: Explanation, Demonstration, Tactile cues, Verbal cues, and Handouts Education comprehension: verbalized understanding, returned demonstration, verbal cues required, tactile cues required, and needs further education   HOME EXERCISE PROGRAM: Access Code: [email or web address]   ASSESSMENT:  CLINICAL IMPRESSION: Alternate exercises done today due to increased pain in R from fall yesterday, however she has minimal pain with acitivities today. Improved ability for SLR on L today. Good ability for step up, but pt with mild hesitation and decreased confidence. Will benefit from continued practice with stairs in future sessions.   Pt with good tolerance for activities today, no pain. She is challenged with step ups and SLR due to weakness, as well as Tandem stance for instability. She has decreased confidence with stairs, balance and functional activity stemming from knee. Will continue to benefit from strengthening and stabilization as tolerated for improving confidence and safety with activity. Discussed recommendations for continued care, and for increased frequency of PT if able.   Eval: Patient presents with primary complaint of weakness in L knee. She has only mild flexion Rom limitations, but does have decreased strength and stability in L knee and hip. Instability symptoms likely stemming from OA. She has decreased ability and confidence with transfers, bending, squatting and stairs.  Pt  with decreased ability for full functional activities. Pt will  benefit from skilled PT to improve deficits and pain and to return to PLOF.   OBJECTIVE IMPAIRMENTS: Abnormal gait, decreased activity tolerance, decreased knowledge of use of DME, decreased mobility, decreased ROM, decreased strength, impaired flexibility, improper body mechanics, and pain.   ACTIVITY LIMITATIONS: bending, standing, squatting, stairs, transfers, and locomotion level  PARTICIPATION LIMITATIONS: meal prep, cleaning, shopping, community activity, and yard work  PERSONAL FACTORS: Time since onset of injury/illness/exacerbation are also affecting patient's functional outcome.   REHAB POTENTIAL: Good  CLINICAL DECISION MAKING: Stable/uncomplicated  EVALUATION COMPLEXITY: Low   GOALS: Goals reviewed with patient? Yes   SHORT TERM GOALS: Target date: 06/22/2023   Pt to be independent with initial HEP  Goal status: INITIAL  2. Pt to demo optimal mechanics for sit to stand from regular chair with light use of hands.    LONG TERM GOALS: Target date: 08/03/2023  Pt to be independent with final HEP  Goal status: INITIAL  2.  Pt to demo max strength in L LE for improved ability for transfers, stairs, and gait.   Goal status: INITIAL  3.  Pt to demo ability and optimal mechanics for stairs, reciprocal, with light UE support, to improve community navigation.   Goal status: INITIAL  4.  Pt to report at least  75 % improvement in overall symptoms.   Goal status: INITIAL   PLAN:  PT FREQUENCY: 1-2x/week  PT DURATION: 8 weeks  PLANNED INTERVENTIONS: Therapeutic exercises, Therapeutic activity, Neuromuscular re-education, Patient/Family education, Self Care, Joint mobilization, Joint manipulation, Stair training, Orthotic/Fit training, DME instructions, Aquatic Therapy, Dry Needling, Electrical stimulation, Cryotherapy, Moist heat, Taping, Ultrasound, Ionotophoresis 4mg /ml Dexamethasone, Manual therapy,   Vasopneumatic device, Traction, Spinal manipulation, Spinal mobilization,Balance training, Gait training,   PLAN FOR NEXT SESSION: marching, stairs, step ups, toe taps, balance.    Sedalia Muta, PT, DPT 11:17 AM  07/14/23

## 2023-07-15 ENCOUNTER — Other Ambulatory Visit (HOSPITAL_COMMUNITY): Payer: Self-pay

## 2023-07-20 ENCOUNTER — Other Ambulatory Visit: Payer: Self-pay

## 2023-07-22 ENCOUNTER — Encounter: Admitting: Physical Therapy

## 2023-07-22 ENCOUNTER — Other Ambulatory Visit (HOSPITAL_COMMUNITY): Payer: Self-pay

## 2023-07-24 ENCOUNTER — Other Ambulatory Visit: Payer: Self-pay | Admitting: Internal Medicine

## 2023-07-24 DIAGNOSIS — T50905A Adverse effect of unspecified drugs, medicaments and biological substances, initial encounter: Secondary | ICD-10-CM

## 2023-07-24 DIAGNOSIS — I10 Essential (primary) hypertension: Secondary | ICD-10-CM

## 2023-07-26 ENCOUNTER — Encounter: Payer: Self-pay | Admitting: Physical Therapy

## 2023-07-26 ENCOUNTER — Ambulatory Visit (INDEPENDENT_AMBULATORY_CARE_PROVIDER_SITE_OTHER): Admitting: Physical Therapy

## 2023-07-26 DIAGNOSIS — R262 Difficulty in walking, not elsewhere classified: Secondary | ICD-10-CM

## 2023-07-26 DIAGNOSIS — G8929 Other chronic pain: Secondary | ICD-10-CM

## 2023-07-26 DIAGNOSIS — M6281 Muscle weakness (generalized): Secondary | ICD-10-CM

## 2023-07-26 DIAGNOSIS — M25562 Pain in left knee: Secondary | ICD-10-CM

## 2023-07-26 NOTE — Therapy (Signed)
 OUTPATIENT PHYSICAL THERAPY LOWER EXTREMITY TREATMENT   Patient Name: EMBERLEE SORTINO MRN: [id number] DOB:11-25-1960, 63 y.o., female Today's Date: 07/26/2023   END OF SESSION:  PT End of Session - 07/26/23 1436     Visit Number 5    Number of Visits 16    Date for PT Re-Evaluation 08/03/23    Authorization Type [employer]    PT Start Time 1349    PT Stop Time 1433    PT Time Calculation (min) 44 min    Activity Tolerance Patient tolerated treatment well    Behavior During Therapy [facility] for tasks assessed/performed               Past Medical History:  Diagnosis Date   Anxiety    Arthritis May 2023   Back pain    Candidiasis of vagina 08/12/2017   Chest pain 04/28/2021   Depression    Fibroids    H/O   Ganglion of right hand 05/21/2015   Hallux valgus 11/04/2021   Hypertension    states under control with meds., has been on med. > 15 yr.   Infertility, female    Knee pain    Mass of foot 11/04/2021   Mass of right hand 09/2015   Neck pain    Pain in left foot 10/28/2021   Pelvic pain in female    H/O   Plantar fasciitis    hx   Prediabetes 04/28/2021   No results found for: "HGBA1C"         Sleep apnea July 2020   Snoring 08/23/2019   Past Surgical History:  Procedure Laterality Date   ABDOMINAL HYSTERECTOMY  In 2008   BREAST EXCISIONAL BIOPSY Right 2005   BREAST MASS EXCISION Right 08/23/2003   BREAST SURGERY  In 1996   Biopsy   CHROMOPERTUBATION  04/15/2001   COLONOSCOPY  06/25/2011   Procedure: COLONOSCOPY;  Surgeon: Garrett Kallman, MD;  Location: [facility];  Service: Endoscopy;  Laterality: N/A;  MAC   COLONOSCOPY  2023   EXCISION OF ENDOMETRIOMA Right 04/15/2001   right tube   EYE SURGERY Right June 02, 2022   HERNIA REPAIR  In 1989   INGUINAL HERNIA REPAIR Left    KNEE ARTHROSCOPY  02/02/2012   Procedure: ARTHROSCOPY KNEE;  Surgeon: Genevie Kerns, MD;  Location: [facility];  Service: Orthopedics;  Laterality:  Left;  ARTHROSCOPY KNEE WITH DEBRIDEMENT/SHAVING (CHONDROPLASTY)   LAPAROSCOPIC LYSIS OF ADHESIONS  04/15/2001; 05/24/2007   MASS EXCISION Right 10/02/2015   Procedure: EXCISION OF RIGHT HAND  MASS;  Surgeon: Florida Hurter, MD;  Location: [facility];  Service: Orthopedics;  Laterality: Right;   REPAIR VAGINAL CUFF  06/18/2007   ROBOTIC ASSISTED TOTAL HYSTERECTOMY WITH BILATERAL SALPINGO OOPHERECTOMY  05/24/2007   TOE SURGERY Left 2021   great toe   Patient Active Problem List   Diagnosis Date Noted   Diabetes mellitus type 2, uncomplicated (HCC) 05/23/2023   Irregular bowel habits 11/16/2022   History of colon polyps 07/05/2022   Hyperlipidemia 07/05/2022   Work-related stress 07/01/2022   Pain in joint of left knee 08/28/2021   Severe obstructive sleep apnea-hypopnea syndrome 04/17/2020   Hypokalemia due to loss of potassium 08/23/2019   Morbid obesity (HCC) 08/23/2019   Chronic back pain 08/12/2017   Herpes simplex 08/12/2017   Obesity 08/12/2017   Hypertension    Fibroadenoma of right breast     PCP: Scherrie Curt  REFERRING PROVIDER: Scherrie Curt  REFERRING  DIAG: Leg weakness  THERAPY DIAG:  Muscle weakness (generalized)  Difficulty in walking, not elsewhere classified  Chronic pain of left knee  Rationale for Evaluation and Treatment: Rehabilitation  ONSET DATE:     SUBJECTIVE:   SUBJECTIVE STATEMENT: Pt felling better from recent fall. R knee still sore to touch, but not with movement. L knee doing better on stairs, but still feels unstable.   Eval: Pt states L knee and leg feel weak. She did have Meniscal repair 2013. Thought she made good recovery. She also had x-ray in 2023, with findings for OA-  Moderate medial compartment and mild patellofemoral compartment osteoarthritis. She reports no pain at this time, but weakness feeling and feels like her leg may give out on stairs. Stairs difficulty and getting up/out of chair.  She is not  currently exercising, but  Starting walking- due to weight gain, would like to lose weight.  She has 1 step to enter outside no handrail. - states not a problem.    Does have computer room upstairs, goes up occasionally, with 1 hand rail. More difficult with multiple steps/full flight .   PERTINENT HISTORY: HTN, DM, hysterectomy,   PAIN:  Are you having pain? No : NPRS scale: 0/10   PRECAUTIONS: None   WEIGHT BEARING RESTRICTIONS: No   FALLS:  Has patient fallen in last 6 months? No   PLOF: Independent  PATIENT GOALS:  Improved strength of L knee/leg   NEXT MD VISIT:   OBJECTIVE:   DIAGNOSTIC FINDINGS:   IMPRESSION: Moderate medial compartment and mild patellofemoral compartment osteoarthritis.  PATIENT SURVEYS:     COGNITION: Overall cognitive status: Within functional limits for tasks assessed     SENSATION: WFL  EDEMA:    POSTURE:    No Significant postural limitations  PALPATION:   LOWER EXTREMITY ROM:  Hips: WFL Knees: R: WFL,   L: mild limitation for flexion, ext: WFL  LOWER EXTREMITY MMT:  MMT Left eval Right  eval  Hip flexion 4 4  Hip extension    Hip abduction 4 4  Hip adduction    Hip internal rotation    Hip external rotation    Knee flexion 4 4+  Knee extension 4 4+  Ankle dorsiflexion    Ankle plantarflexion    Ankle inversion    Ankle eversion     (Blank rows = not tested)  LOWER EXTREMITY SPECIAL TESTS:    FUNCTIONAL TESTS:   GAIT:  TODAY'S TREATMENT:                                                                                                                              DATE:   07/26/2023 Therapeutic Exercise: Aerobic:  Supine:    S/L:  Seated:  LAQ   3 lb 2 x 10 bil;  Standing: tandem stance 30 sec x 2 bil;  Hip abd 2 x 10 bil; Stretches:  Neuromuscular Re-education:  Manual Therapy:  Therapeutic  Activity:     Marching x 20 (light R Ue support)  Step ups 6 in x 10 bil;   bwd walking x 6 in hallway,   Sit to stand from regular chair, with and without UE support 3 x 2 each, with education on body and arm position for ease of transfer.  Self Care:  Previous:  Therapeutic Exercise: Aerobic:  Supine:  SLR  2 x 10 bil;    bridging x 10, then x 5   S/L:  hip abd 2 x 10 bil;  Seated:  LAQ x 10 bil; 3 lb 2 x 10 bil Standing: tandem stance 30 sec x 2 bil;  Stretches:  Neuromuscular Re-education:  Manual Therapy:  Therapeutic Activity:  Step ups 4 in x 10 bil;   March x 15;    Self Care:   PATIENT EDUCATION:  Education details: updated and reviewed HEP  Person educated: Patient Education method: Explanation, Demonstration, Tactile cues, Verbal cues, and Handouts Education comprehension: verbalized understanding, returned demonstration, verbal cues required, tactile cues required, and needs further education   HOME EXERCISE PROGRAM: Access Code: [email or web address]   ASSESSMENT:  CLINICAL IMPRESSION: Pt with improving ability for standing ther ex. Still with decreased stability and confidence seen on L with step ups towards end of session when leg fatigued. Improved ease and ability for transfer after education and practice today. Plan to progress strength and stability as tolerated.   Pt with good tolerance for activities today, no pain. She is challenged with step ups and SLR due to weakness, as well as Tandem stance for instability. She has decreased confidence with stairs, balance and functional activity stemming from knee. Will continue to benefit from strengthening and stabilization as tolerated for improving confidence and safety with activity. Discussed recommendations for continued care, and for increased frequency of PT if able.   Eval: Patient presents with primary complaint of weakness in L knee. She has only mild flexion Rom limitations, but does have decreased strength and stability in L knee and hip. Instability symptoms likely stemming from OA. She has decreased ability and  confidence with transfers, bending, squatting and stairs.  Pt with decreased ability for full functional activities. Pt will  benefit from skilled PT to improve deficits and pain and to return to PLOF.   OBJECTIVE IMPAIRMENTS: Abnormal gait, decreased activity tolerance, decreased knowledge of use of DME, decreased mobility, decreased ROM, decreased strength, impaired flexibility, improper body mechanics, and pain.   ACTIVITY LIMITATIONS: bending, standing, squatting, stairs, transfers, and locomotion level  PARTICIPATION LIMITATIONS: meal prep, cleaning, shopping, community activity, and yard work  PERSONAL FACTORS: Time since onset of injury/illness/exacerbation are also affecting patient's functional outcome.   REHAB POTENTIAL: Good  CLINICAL DECISION MAKING: Stable/uncomplicated  EVALUATION COMPLEXITY: Low   GOALS: Goals reviewed with patient? Yes   SHORT TERM GOALS: Target date: 06/22/2023   Pt to be independent with initial HEP  Goal status: INITIAL  2. Pt to demo optimal mechanics for sit to stand from regular chair with light use of hands.    LONG TERM GOALS: Target date: 08/03/2023  Pt to be independent with final HEP  Goal status: INITIAL  2.  Pt to demo max strength in L LE for improved ability for transfers, stairs, and gait.   Goal status: INITIAL  3.  Pt to demo ability and optimal mechanics for stairs, reciprocal, with light UE support, to improve community navigation.   Goal status: INITIAL  4.  Pt to report at least  75 % improvement in overall symptoms.   Goal status: INITIAL   PLAN:  PT FREQUENCY: 1-2x/week  PT DURATION: 8 weeks  PLANNED INTERVENTIONS: Therapeutic exercises, Therapeutic activity, Neuromuscular re-education, Patient/Family education, Self Care, Joint mobilization, Joint manipulation, Stair training, Orthotic/Fit training, DME instructions, Aquatic Therapy, Dry Needling, Electrical stimulation, Cryotherapy, Moist heat, Taping,  Ultrasound, Ionotophoresis 4mg /ml Dexamethasone , Manual therapy,  Vasopneumatic device, Traction, Spinal manipulation, Spinal mobilization,Balance training, Gait training,   PLAN FOR NEXT SESSION: marching, stairs, step ups, toe taps, balance.    Terrilee Few, PT, DPT 2:36 PM  07/26/23

## 2023-07-27 ENCOUNTER — Other Ambulatory Visit (HOSPITAL_COMMUNITY): Payer: Self-pay

## 2023-07-28 ENCOUNTER — Encounter: Payer: Self-pay | Admitting: Physical Therapy

## 2023-07-28 ENCOUNTER — Ambulatory Visit (INDEPENDENT_AMBULATORY_CARE_PROVIDER_SITE_OTHER): Admitting: Physical Therapy

## 2023-07-28 DIAGNOSIS — G8929 Other chronic pain: Secondary | ICD-10-CM

## 2023-07-28 DIAGNOSIS — M25562 Pain in left knee: Secondary | ICD-10-CM

## 2023-07-28 DIAGNOSIS — R262 Difficulty in walking, not elsewhere classified: Secondary | ICD-10-CM

## 2023-07-28 DIAGNOSIS — M6281 Muscle weakness (generalized): Secondary | ICD-10-CM

## 2023-07-28 NOTE — Therapy (Signed)
 OUTPATIENT PHYSICAL THERAPY LOWER EXTREMITY TREATMENT   Patient Name: JENNETTA FLOOD MRN: [id number] DOB:[birth year], 63 y.o., female Today's Date: 07/28/2023   END OF SESSION:  PT End of Session - 07/28/23 1159     Visit Number 6    Number of Visits 16    Date for PT Re-Evaluation 08/03/23    Authorization Type [employer]    PT Start Time 1150    PT Stop Time 1230    PT Time Calculation (min) 40 min    Activity Tolerance Patient tolerated treatment well    Behavior During Therapy [facility] for tasks assessed/performed                Past Medical History:  Diagnosis Date   Anxiety    Arthritis May 2023   Back pain    Candidiasis of vagina 08/12/2017   Chest pain 04/28/2021   Depression    Fibroids    H/O   Ganglion of right hand 05/21/2015   Hallux valgus 11/04/2021   Hypertension    states under control with meds., has been on med. > 15 yr.   Infertility, female    Knee pain    Mass of foot 11/04/2021   Mass of right hand 09/2015   Neck pain    Pain in left foot 10/28/2021   Pelvic pain in female    H/O   Plantar fasciitis    hx   Prediabetes 04/28/2021   No results found for: "HGBA1C"         Sleep apnea July 2020   Snoring 08/23/2019   Past Surgical History:  Procedure Laterality Date   ABDOMINAL HYSTERECTOMY  In 2008   BREAST EXCISIONAL BIOPSY Right 2005   BREAST MASS EXCISION Right 08/23/2003   BREAST SURGERY  In 1996   Biopsy   CHROMOPERTUBATION  04/15/2001   COLONOSCOPY  06/25/2011   Procedure: COLONOSCOPY;  Surgeon: Garrett Kallman, MD;  Location: [facility];  Service: Endoscopy;  Laterality: N/A;  MAC   COLONOSCOPY  2023   EXCISION OF ENDOMETRIOMA Right 04/15/2001   right tube   EYE SURGERY Right June 02, 2022   HERNIA REPAIR  In 1989   INGUINAL HERNIA REPAIR Left    KNEE ARTHROSCOPY  02/02/2012   Procedure: ARTHROSCOPY KNEE;  Surgeon: Genevie Kerns, MD;  Location: [facility];  Service: Orthopedics;  Laterality:  Left;  ARTHROSCOPY KNEE WITH DEBRIDEMENT/SHAVING (CHONDROPLASTY)   LAPAROSCOPIC LYSIS OF ADHESIONS  04/15/2001; 05/24/2007   MASS EXCISION Right 10/02/2015   Procedure: EXCISION OF RIGHT HAND  MASS;  Surgeon: Florida Hurter, MD;  Location: [facility];  Service: Orthopedics;  Laterality: Right;   REPAIR VAGINAL CUFF  06/18/2007   ROBOTIC ASSISTED TOTAL HYSTERECTOMY WITH BILATERAL SALPINGO OOPHERECTOMY  05/24/2007   TOE SURGERY Left 2021   great toe   Patient Active Problem List   Diagnosis Date Noted   Diabetes mellitus type 2, uncomplicated (HCC) 05/23/2023   Irregular bowel habits 11/16/2022   History of colon polyps 07/05/2022   Hyperlipidemia 07/05/2022   Work-related stress 07/01/2022   Pain in joint of left knee 08/28/2021   Severe obstructive sleep apnea-hypopnea syndrome 04/17/2020   Hypokalemia due to loss of potassium 08/23/2019   Morbid obesity (HCC) 08/23/2019   Chronic back pain 08/12/2017   Herpes simplex 08/12/2017   Obesity 08/12/2017   Hypertension    Fibroadenoma of right breast     PCP: Scherrie Curt  REFERRING PROVIDER: Scherrie Curt  REFERRING DIAG: Leg weakness  THERAPY DIAG:  Muscle weakness (generalized)  Difficulty in walking, not elsewhere classified  Chronic pain of left knee  Rationale for Evaluation and Treatment: Rehabilitation  ONSET DATE:     SUBJECTIVE:   SUBJECTIVE STATEMENT: Pt with no new complaints.   Eval: Pt states L knee and leg feel weak. She did have Meniscal repair 2013. Thought she made good recovery. She also had x-ray in 2023, with findings for OA-  Moderate medial compartment and mild patellofemoral compartment osteoarthritis. She reports no pain at this time, but weakness feeling and feels like her leg may give out on stairs. Stairs difficulty and getting up/out of chair.  She is not currently exercising, but  Starting walking- due to weight gain, would like to lose weight.  She has 1 step to enter  outside no handrail. - states not a problem.    Does have computer room upstairs, goes up occasionally, with 1 hand rail. More difficult with multiple steps/full flight .   PERTINENT HISTORY: HTN, DM, hysterectomy,   PAIN:  Are you having pain? No : NPRS scale: 0/10   PRECAUTIONS: None   WEIGHT BEARING RESTRICTIONS: No   FALLS:  Has patient fallen in last 6 months? No   PLOF: Independent  PATIENT GOALS:  Improved strength of L knee/leg   NEXT MD VISIT:   OBJECTIVE:   DIAGNOSTIC FINDINGS:   IMPRESSION: Moderate medial compartment and mild patellofemoral compartment osteoarthritis.  PATIENT SURVEYS:     COGNITION: Overall cognitive status: Within functional limits for tasks assessed     SENSATION: WFL  EDEMA:    POSTURE:    No Significant postural limitations  PALPATION:   LOWER EXTREMITY ROM:  Hips: WFL Knees: R: WFL,   L: mild limitation for flexion, ext: WFL  LOWER EXTREMITY MMT:  MMT Left eval Right  eval  Hip flexion 4 4  Hip extension    Hip abduction 4 4  Hip adduction    Hip internal rotation    Hip external rotation    Knee flexion 4 4+  Knee extension 4 4+  Ankle dorsiflexion    Ankle plantarflexion    Ankle inversion    Ankle eversion     (Blank rows = not tested)  LOWER EXTREMITY SPECIAL TESTS:    FUNCTIONAL TESTS:   GAIT:  TODAY'S TREATMENT:                                                                                                                              DATE:   07/28/2023 Therapeutic Exercise: Aerobic:  Supine:   bridging 2 x 10 S/L:  Seated:  LAQ   3 lb 2 x 10 bil; HSC GTB 2 x 10 bil;  Standing:    Stretches:  Neuromuscular Re-education:  tandem stance 30 sec x 2 bil;  bwd walking hallway15 ft x 6;  Counter: side stepping x 4 ; tandem walk x 4;  Manual Therapy:  Therapeutic Activity:  Step ups 6 in x 10 bil;  stairs up/down 5 steps with light 1 UE support, recipricol  x6;  Sit to stand from mat  table without UE support 3 x 5 each, with education on body and arm position for ease of transfer.  Self Care:   Therapeutic Exercise: Aerobic:  Supine:    S/L:  Seated:  LAQ   3 lb 2 x 10 bil;  Standing: tandem stance 30 sec x 2 bil;  Hip abd 2 x 10 bil; Stretches:  Neuromuscular Re-education:  Manual Therapy:  Therapeutic Activity:     Marching x 20 (light R Ue support)  Step ups 6 in x 10 bil;   bwd walking x 6 in hallway,  Sit to stand from regular chair, with and without UE support 3 x 2 each, with education on body and arm position for ease of transfer.  Self Care:   Previous:  Therapeutic Exercise: Aerobic:  Supine:  SLR  2 x 10 bil;    bridging x 10, then x 5   S/L:  hip abd 2 x 10 bil;  Seated:  LAQ x 10 bil; 3 lb 2 x 10 bil Standing: tandem stance 30 sec x 2 bil;  Stretches:  Neuromuscular Re-education:  Manual Therapy:  Therapeutic Activity:  Step ups 4 in x 10 bil;   March x 15;    Self Care:   PATIENT EDUCATION:  Education details: updated and reviewed HEP  Person educated: Patient Education method: Explanation, Demonstration, Tactile cues, Verbal cues, and Handouts Education comprehension: verbalized understanding, returned demonstration, verbal cues required, tactile cues required, and needs further education   HOME EXERCISE PROGRAM: Access Code: [email or web address]   ASSESSMENT:  CLINICAL IMPRESSION: Pt with improving ability for activities. Added dynamic motions today and continued stairs. Pt most challenged with stair climbing due to weakness and instability in L knee. Pt to benefit from continued care.   Eval: Patient presents with primary complaint of weakness in L knee. She has only mild flexion Rom limitations, but does have decreased strength and stability in L knee and hip. Instability symptoms likely stemming from OA. She has decreased ability and confidence with transfers, bending, squatting and stairs.  Pt with decreased ability for full functional  activities. Pt will  benefit from skilled PT to improve deficits and pain and to return to PLOF.   OBJECTIVE IMPAIRMENTS: Abnormal gait, decreased activity tolerance, decreased knowledge of use of DME, decreased mobility, decreased ROM, decreased strength, impaired flexibility, improper body mechanics, and pain.   ACTIVITY LIMITATIONS: bending, standing, squatting, stairs, transfers, and locomotion level  PARTICIPATION LIMITATIONS: meal prep, cleaning, shopping, community activity, and yard work  PERSONAL FACTORS: Time since onset of injury/illness/exacerbation are also affecting patient's functional outcome.   REHAB POTENTIAL: Good  CLINICAL DECISION MAKING: Stable/uncomplicated  EVALUATION COMPLEXITY: Low   GOALS: Goals reviewed with patient? Yes   SHORT TERM GOALS: Target date: 06/22/2023   Pt to be independent with initial HEP  Goal status: INITIAL  2. Pt to demo optimal mechanics for sit to stand from regular chair with light use of hands.    LONG TERM GOALS: Target date: 08/03/2023  Pt to be independent with final HEP  Goal status: INITIAL  2.  Pt to demo max strength in L LE for improved ability for transfers, stairs, and gait.   Goal status: INITIAL  3.  Pt to demo ability and optimal mechanics for stairs, reciprocal, with light UE  support, to improve community navigation.   Goal status: INITIAL  4.  Pt to report at least 75 % improvement in overall symptoms.   Goal status: INITIAL   PLAN:  PT FREQUENCY: 1-2x/week  PT DURATION: 8 weeks  PLANNED INTERVENTIONS: Therapeutic exercises, Therapeutic activity, Neuromuscular re-education, Patient/Family education, Self Care, Joint mobilization, Joint manipulation, Stair training, Orthotic/Fit training, DME instructions, Aquatic Therapy, Dry Needling, Electrical stimulation, Cryotherapy, Moist heat, Taping, Ultrasound, Ionotophoresis 4mg /ml Dexamethasone , Manual therapy,  Vasopneumatic device, Traction, Spinal  manipulation, Spinal mobilization,Balance training, Gait training,   PLAN FOR NEXT SESSION:  marching, stairs, step ups, toe taps, balance.    Terrilee Few, PT, DPT 12:55 PM  07/28/23

## 2023-08-04 ENCOUNTER — Encounter: Payer: Self-pay | Admitting: Physical Therapy

## 2023-08-04 ENCOUNTER — Ambulatory Visit (INDEPENDENT_AMBULATORY_CARE_PROVIDER_SITE_OTHER): Admitting: Physical Therapy

## 2023-08-04 DIAGNOSIS — R262 Difficulty in walking, not elsewhere classified: Secondary | ICD-10-CM | POA: Diagnosis not present

## 2023-08-04 DIAGNOSIS — M25562 Pain in left knee: Secondary | ICD-10-CM | POA: Diagnosis not present

## 2023-08-04 DIAGNOSIS — G8929 Other chronic pain: Secondary | ICD-10-CM | POA: Diagnosis not present

## 2023-08-04 DIAGNOSIS — M6281 Muscle weakness (generalized): Secondary | ICD-10-CM

## 2023-08-04 NOTE — Therapy (Addendum)
 " OUTPATIENT PHYSICAL THERAPY LOWER EXTREMITY TREATMENT   Patient Name: GEORGINA KRIST MRN: [id number] DOB:1960/09/20, 63 y.o., female Today's Date: 08/04/2023   END OF SESSION:  PT End of Session - 08/04/23 1306     Visit Number 7    Number of Visits 16    Date for PT Re-Evaluation 08/03/23    Authorization Type [employer]    PT Start Time 1218    PT Stop Time 1300    PT Time Calculation (min) 42 min    Activity Tolerance Patient tolerated treatment well    Behavior During Therapy [facility] for tasks assessed/performed                 Past Medical History:  Diagnosis Date   Anxiety    Arthritis May 2023   Back pain    Candidiasis of vagina 08/12/2017   Chest pain 04/28/2021   Depression    Fibroids    H/O   Ganglion of right hand 05/21/2015   Hallux valgus 11/04/2021   Hypertension    states under control with meds., has been on med. > 15 yr.   Infertility, female    Knee pain    Mass of foot 11/04/2021   Mass of right hand 09/2015   Neck pain    Pain in left foot 10/28/2021   Pelvic pain in female    H/O   Plantar fasciitis    hx   Prediabetes 04/28/2021   No results found for: HGBA1C         Sleep apnea July 2020   Snoring 08/23/2019   Past Surgical History:  Procedure Laterality Date   ABDOMINAL HYSTERECTOMY  In 2008   BREAST EXCISIONAL BIOPSY Right 2005   BREAST MASS EXCISION Right 08/23/2003   BREAST SURGERY  In 1996   Biopsy   CHROMOPERTUBATION  04/15/2001   COLONOSCOPY  06/25/2011   Procedure: COLONOSCOPY;  Surgeon: Gladis MARLA Louder, MD;  Location: [facility];  Service: Endoscopy;  Laterality: N/A;  MAC   COLONOSCOPY  2023   EXCISION OF ENDOMETRIOMA Right 04/15/2001   right tube   EYE SURGERY Right June 02, 2022   HERNIA REPAIR  In 1989   INGUINAL HERNIA REPAIR Left    KNEE ARTHROSCOPY  02/02/2012   Procedure: ARTHROSCOPY KNEE;  Surgeon: Lamar DELENA Millman, MD;  Location: [facility];  Service: Orthopedics;   Laterality: Left;  ARTHROSCOPY KNEE WITH DEBRIDEMENT/SHAVING (CHONDROPLASTY)   LAPAROSCOPIC LYSIS OF ADHESIONS  04/15/2001; 05/24/2007   MASS EXCISION Right 10/02/2015   Procedure: EXCISION OF RIGHT HAND  MASS;  Surgeon: Donnice Robinsons, MD;  Location:  SURGERY CENTER;  Service: Orthopedics;  Laterality: Right;   REPAIR VAGINAL CUFF  06/18/2007   ROBOTIC ASSISTED TOTAL HYSTERECTOMY WITH BILATERAL SALPINGO OOPHERECTOMY  05/24/2007   TOE SURGERY Left 2021   great toe   Patient Active Problem List   Diagnosis Date Noted   Diabetes mellitus type 2, uncomplicated (HCC) 05/23/2023   Irregular bowel habits 11/16/2022   History of colon polyps 07/05/2022   Hyperlipidemia 07/05/2022   Work-related stress 07/01/2022   Pain in joint of left knee 08/28/2021   Severe obstructive sleep apnea-hypopnea syndrome 04/17/2020   Hypokalemia due to loss of potassium 08/23/2019   Morbid obesity (HCC) 08/23/2019   Chronic back pain 08/12/2017   Herpes simplex 08/12/2017   Obesity 08/12/2017   Hypertension    Fibroadenoma of right breast     PCP: Bernardino Cone  REFERRING PROVIDER: Bernardino  Jesus  REFERRING DIAG: Leg weakness  THERAPY DIAG:  Muscle weakness (generalized)  Difficulty in walking, not elsewhere classified  Chronic pain of left knee  Rationale for Evaluation and Treatment: Rehabilitation  ONSET DATE:     SUBJECTIVE:   SUBJECTIVE STATEMENT: Pt continues to feel unsure of knee. Still feels stiff/unstable with taking first step after sitting for a while.   Eval: Pt states L knee and leg feel weak. She did have Meniscal repair 2013. Thought she made good recovery. She also had x-ray in 2023, with findings for OA-  Moderate medial compartment and mild patellofemoral compartment osteoarthritis. She reports no pain at this time, but weakness feeling and feels like her leg may give out on stairs. Stairs difficulty and getting up/out of chair.  She is not currently exercising,  but  Starting walking- due to weight gain, would like to lose weight.  She has 1 step to enter outside no handrail. - states not a problem.    Does have computer room upstairs, goes up occasionally, with 1 hand rail. More difficult with multiple steps/full flight .   PERTINENT HISTORY: HTN, DM, hysterectomy,   PAIN:  Are you having pain? No : NPRS scale: 0/10   PRECAUTIONS: None   WEIGHT BEARING RESTRICTIONS: No   FALLS:  Has patient fallen in last 6 months? No   PLOF: Independent  PATIENT GOALS:  Improved strength of L knee/leg   NEXT MD VISIT:   OBJECTIVE:   DIAGNOSTIC FINDINGS:   IMPRESSION: Moderate medial compartment and mild patellofemoral compartment osteoarthritis.  PATIENT SURVEYS:     COGNITION: Overall cognitive status: Within functional limits for tasks assessed     SENSATION: WFL  EDEMA:    POSTURE:    No Significant postural limitations  PALPATION:   LOWER EXTREMITY ROM:  Hips: WFL Knees: R: WFL,   L: mild limitation for flexion, ext: WFL  LOWER EXTREMITY MMT:  MMT Left eval Right  eval  Hip flexion 4 4  Hip extension    Hip abduction 4 4  Hip adduction    Hip internal rotation    Hip external rotation    Knee flexion 4 4+  Knee extension 4 4+  Ankle dorsiflexion    Ankle plantarflexion    Ankle inversion    Ankle eversion     (Blank rows = not tested)  LOWER EXTREMITY SPECIAL TESTS:    FUNCTIONAL TESTS:   GAIT:  TODAY'S TREATMENT:                                                                                                                              DATE:   08/04/2023 Therapeutic Exercise: Aerobic:  bike L 1 x 7 min Supine:  SLR x 10 bil;  S/L:  Seated:   Standing:    Stretches:  Neuromuscular Re-education:  tandem stance 30 sec x 2 bil with head turns L/R;  Slow march for balance 2 x 10;  Manual Therapy:  Therapeutic Activity:  Step ups 6 in x 10 bil;  Sit to stand from mat table without UE support 3  x 5 reps , education on chair height and knee position Self Care:   Therapeutic Exercise: Aerobic:  Supine:    S/L:  Seated:  LAQ   3 lb 2 x 10 bil;  Standing: tandem stance 30 sec x 2 bil;  Hip abd 2 x 10 bil; Stretches:  Neuromuscular Re-education:  Manual Therapy:  Therapeutic Activity:     Marching x 20 (light R Ue support)  Step ups 6 in x 10 bil;   bwd walking x 6 in hallway,  Sit to stand from regular chair, with and without UE support 3 x 2 each, with education on body and arm position for ease of transfer.  Self Care:   Previous:  Therapeutic Exercise: Aerobic:  Supine:  SLR  2 x 10 bil;    bridging x 10, then x 5   S/L:  hip abd 2 x 10 bil;  Seated:  LAQ x 10 bil; 3 lb 2 x 10 bil Standing: tandem stance 30 sec x 2 bil;  Stretches:  Neuromuscular Re-education:  Manual Therapy:  Therapeutic Activity:  Step ups 4 in x 10 bil;   March x 15;    Self Care:   PATIENT EDUCATION:  Education details: updated and reviewed HEP  Person educated: Patient Education method: Programmer, Multimedia, Demonstration, Tactile cues, Verbal cues, and Handouts Education comprehension: verbalized understanding, returned demonstration, verbal cues required, tactile cues required, and needs further education   HOME EXERCISE PROGRAM: Access Code: [email or web address]   ASSESSMENT:  CLINICAL IMPRESSION: Pt with good tolerance, but challenged with single leg activity, marching and stairs, due to instability in knee. She continues to have decreased confidence with knee due to instability feeling. Pt to benefit from continued, progressive strengthening.   Eval: Patient presents with primary complaint of weakness in L knee. She has only mild flexion Rom limitations, but does have decreased strength and stability in L knee and hip. Instability symptoms likely stemming from OA. She has decreased ability and confidence with transfers, bending, squatting and stairs.  Pt with decreased ability for full functional  activities. Pt will  benefit from skilled PT to improve deficits and pain and to return to PLOF.   OBJECTIVE IMPAIRMENTS: Abnormal gait, decreased activity tolerance, decreased knowledge of use of DME, decreased mobility, decreased ROM, decreased strength, impaired flexibility, improper body mechanics, and pain.   ACTIVITY LIMITATIONS: bending, standing, squatting, stairs, transfers, and locomotion level  PARTICIPATION LIMITATIONS: meal prep, cleaning, shopping, community activity, and yard work  PERSONAL FACTORS: Time since onset of injury/illness/exacerbation are also affecting patient's functional outcome.   REHAB POTENTIAL: Good  CLINICAL DECISION MAKING: Stable/uncomplicated  EVALUATION COMPLEXITY: Low   GOALS: Goals reviewed with patient? Yes   SHORT TERM GOALS: Target date: 06/22/2023   Pt to be independent with initial HEP  Goal status: INITIAL  2. Pt to demo optimal mechanics for sit to stand from regular chair with light use of hands.    LONG TERM GOALS: Target date: 08/03/2023  Pt to be independent with final HEP  Goal status: INITIAL  2.  Pt to demo max strength in L LE for improved ability for transfers, stairs, and gait.   Goal status: INITIAL  3.  Pt to demo ability and optimal mechanics for stairs, reciprocal, with light UE support, to improve community navigation.   Goal status: INITIAL  4.  Pt to report at least 75 % improvement in overall symptoms.   Goal status: INITIAL   PLAN:  PT FREQUENCY: 1-2x/week  PT DURATION: 8 weeks  PLANNED INTERVENTIONS: Therapeutic exercises, Therapeutic activity, Neuromuscular re-education, Patient/Family education, Self Care, Joint mobilization, Joint manipulation, Stair training, Orthotic/Fit training, DME instructions, Aquatic Therapy, Dry Needling, Electrical stimulation, Cryotherapy, Moist heat, Taping, Ultrasound, Ionotophoresis 4mg /ml Dexamethasone , Manual therapy,  Vasopneumatic device, Traction, Spinal  manipulation, Spinal mobilization,Balance training, Gait training,   PLAN FOR NEXT SESSION:  marching, stairs, step ups, toe taps, balance.    Tinnie Don, PT, DPT 1:06 PM  08/04/23  PHYSICAL THERAPY DISCHARGE SUMMARY  Visits from Start of Care:  7   Plan: Patient agrees to discharge.  Patient goals were partially met. Patient is being discharged due to - pt did not return after last visit.     Tinnie Don, PT, DPT 11:02 AM  05/02/24      "

## 2023-08-10 ENCOUNTER — Encounter: Admitting: Physical Therapy

## 2023-08-19 ENCOUNTER — Encounter: Admitting: Physical Therapy

## 2023-10-19 ENCOUNTER — Other Ambulatory Visit: Payer: Self-pay | Admitting: Internal Medicine

## 2023-10-19 DIAGNOSIS — I10 Essential (primary) hypertension: Secondary | ICD-10-CM

## 2024-03-16 ENCOUNTER — Ambulatory Visit: Payer: Self-pay

## 2024-03-16 NOTE — Telephone Encounter (Signed)
 FYI Only or Action Required?: FYI only for provider: Advice only.  Patient was last seen in primary care on 05/19/2023 by Jesus Bernardino MATSU, MD.  Called Nurse Triage reporting Advice Only (/).  Symptoms began several weeks ago.  Interventions attempted: OTC medications: Tums.  Symptoms are: unchanged.  Triage Disposition: Information or Advice Only Call  Patient/caregiver understands and will follow disposition?: Yes   Reason for Disposition  Health information question, no triage required and triager able to answer question  Answer Assessment - Initial Assessment Questions Pt reporting indigestion, heartburn, belching. Thinks she over-did it at Thanksgiving. Has had these symptoms in the past. Pt sounds non-distressed. Wanting recommendations on what OTC's to take. Advised pepto bismol, mylanta, tums, pepcid or nexium. No further questions at this time.  1. REASON FOR CALL: What is the main reason for your call? or How can I best help you?     Recommendations on what OTC's to take for indigestion, heartburn, belching 2. SYMPTOMS : Do you have any symptoms?      Indigestion, heartburn, belching. Denies CP or SOB. 3. OTHER QUESTIONS: Do you have any other questions?     Denies  Protocols used: Information Only Call - No Triage-A-AH

## 2024-03-16 NOTE — Telephone Encounter (Signed)
°  First attempt to contact patient for triage.  LVM for patient to return call to [phone]  Message from Alfonso ORN sent at 03/16/2024  3:21 PM EST  Reason for Triage: pt called complaining of indigestion, bloating and heart burn and is wanting advice on OTC medication to take or if something can be prescribed.

## 2024-03-18 IMAGING — CR DG KNEE 1-2V*L*
2 series · 2 of 2 positions shown · non-contrast
Comparison: Left knee radiographs 07/11/2013

CLINICAL DATA: Left knee pain.

EXAM:
LEFT KNEE - 1-2 VIEW

[t knee ap left]
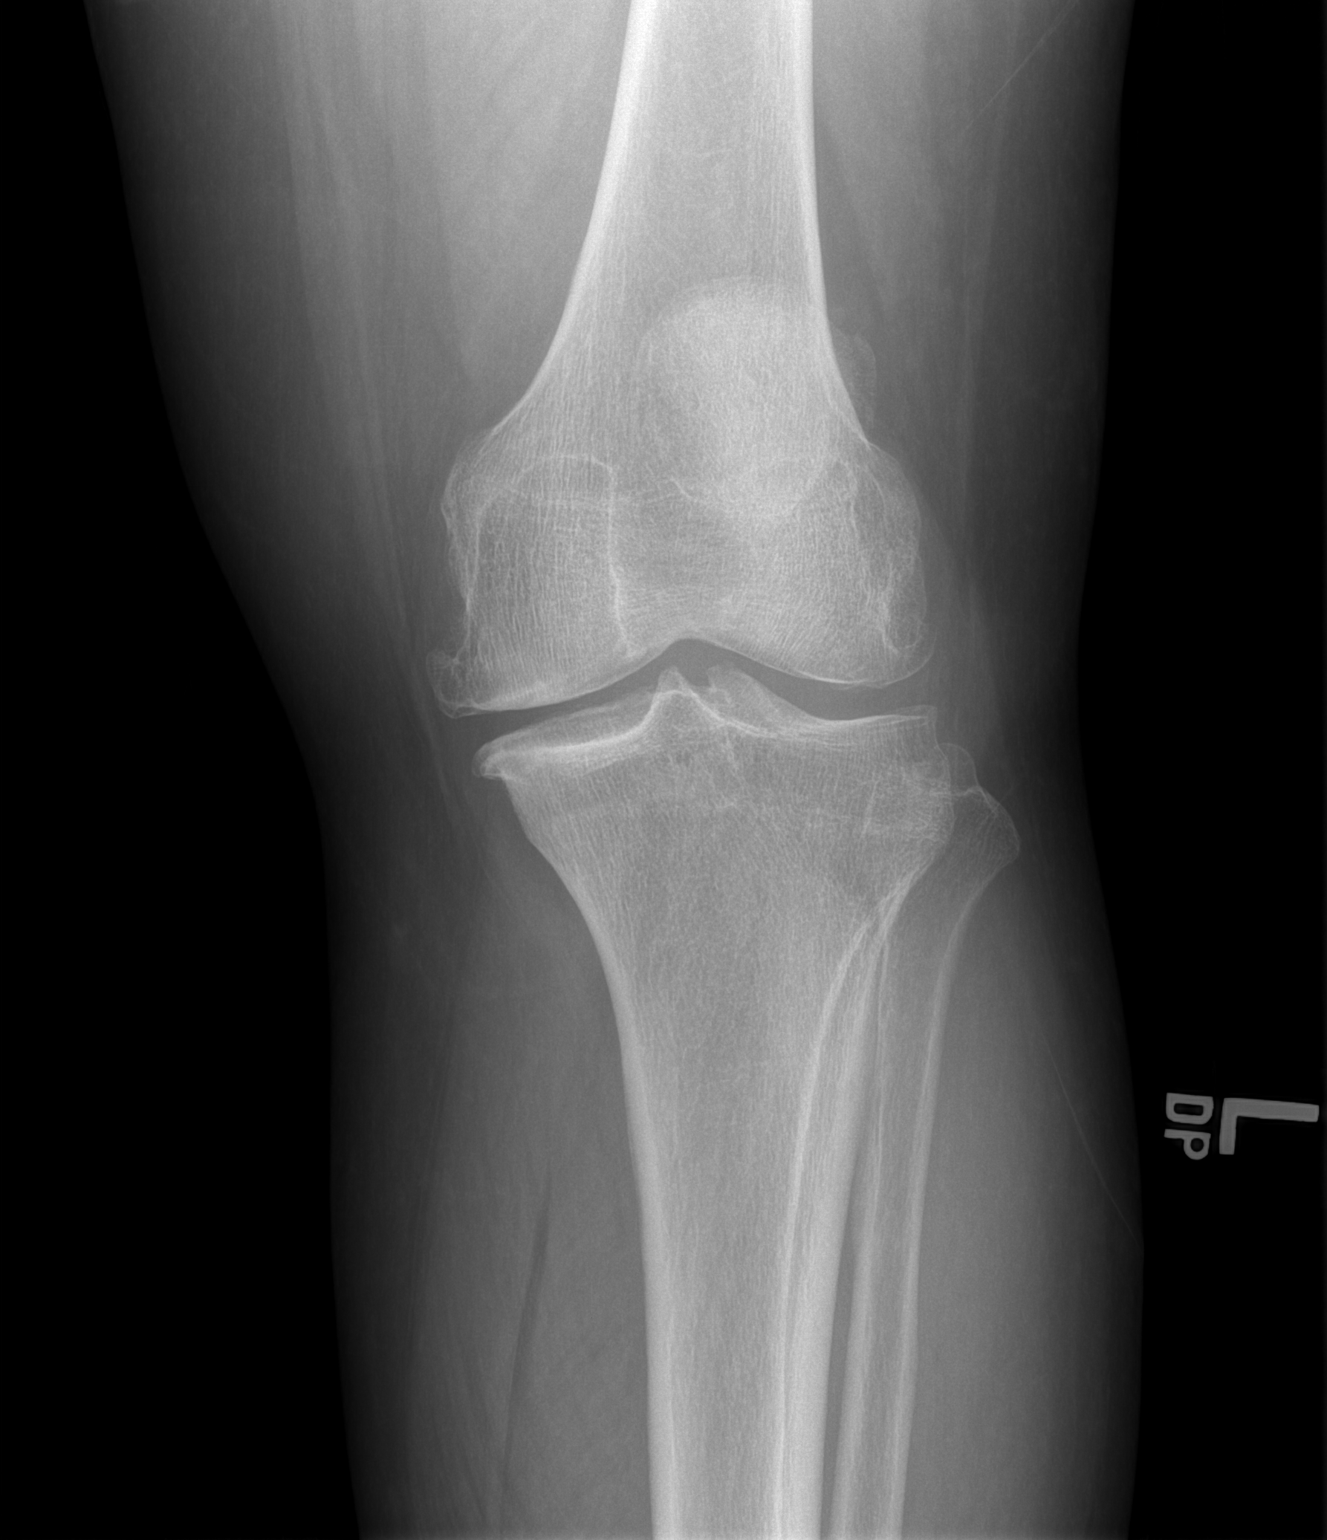

[t knee lat left]
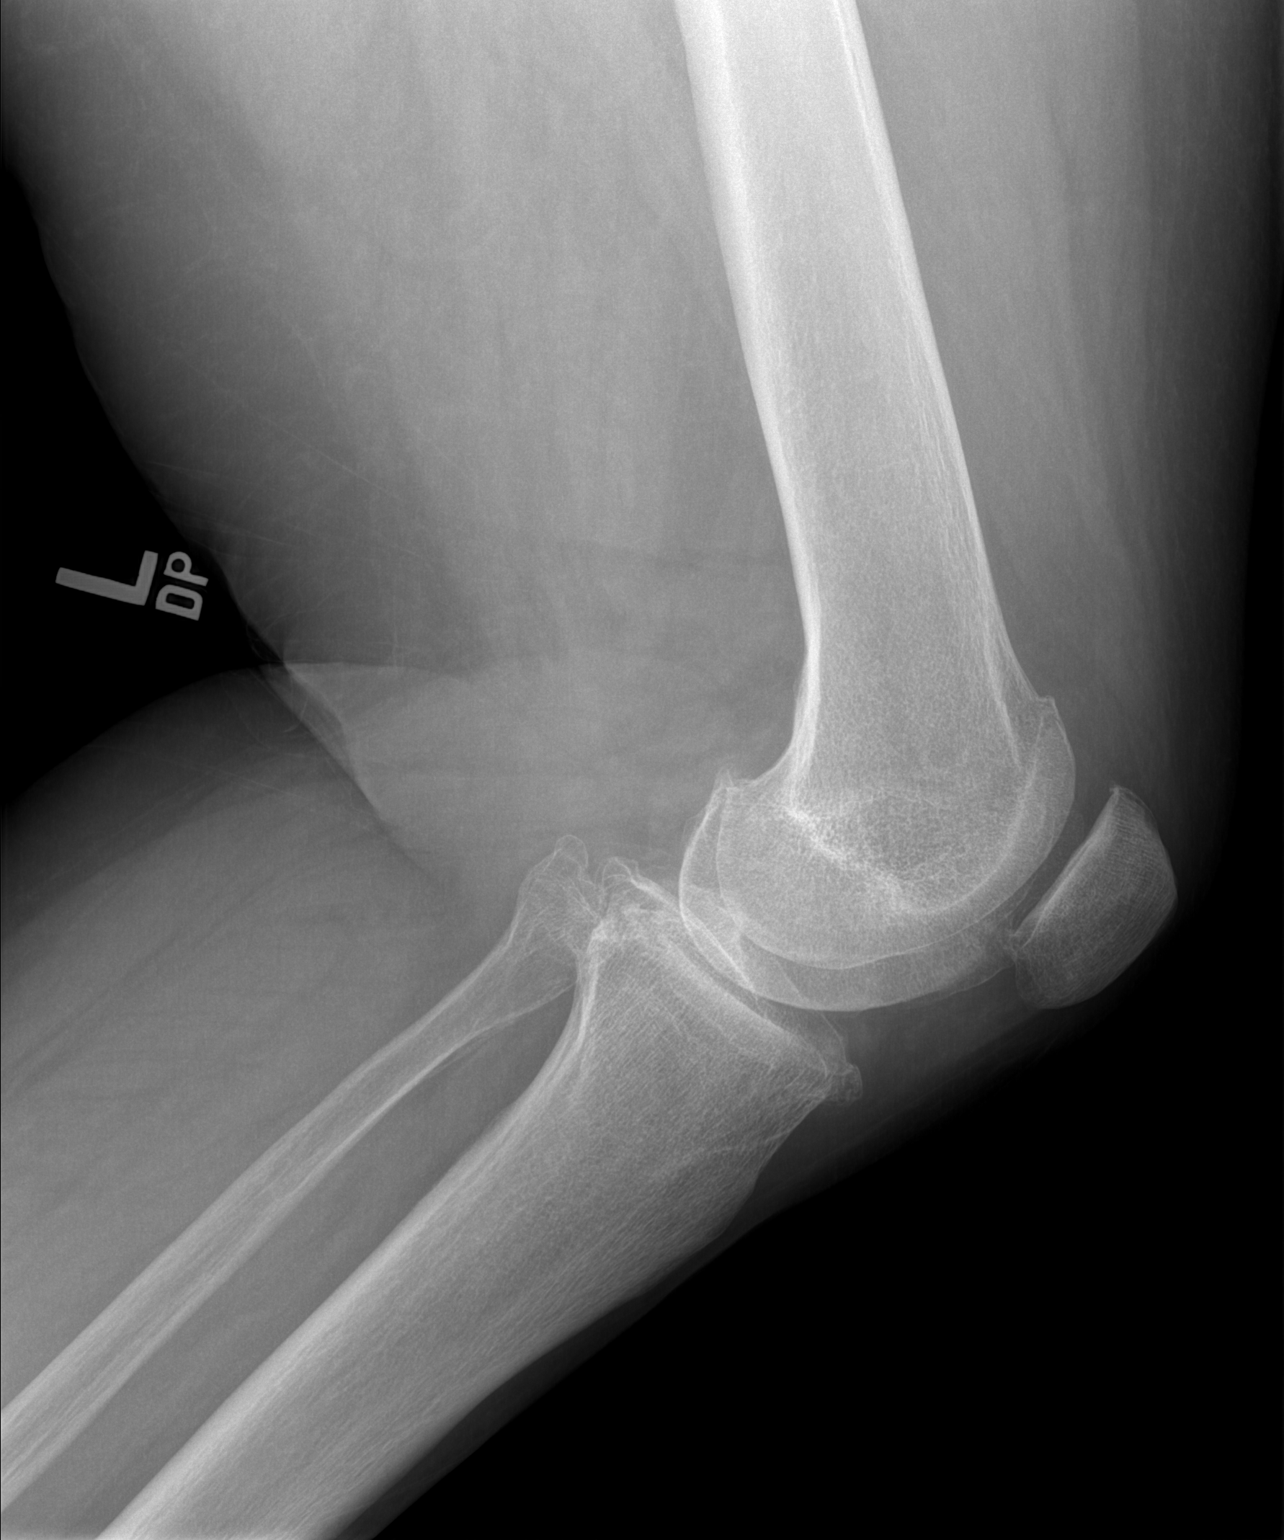

[2 of 2 positions shown; findings below may reference images not displayed]

FINDINGS: Mild medial joint space narrowing with moderate peripheral
degenerative osteophytes. Mild patellofemoral joint space narrowing
and peripheral osteophytosis. No acute fracture or dislocation. No
knee joint effusion.
IMPRESSION: Moderate medial compartment and mild patellofemoral compartment
osteoarthritis.

## 2024-03-20 ENCOUNTER — Other Ambulatory Visit: Payer: Self-pay | Admitting: Internal Medicine

## 2024-03-20 DIAGNOSIS — E876 Hypokalemia: Secondary | ICD-10-CM

## 2024-03-20 DIAGNOSIS — I10 Essential (primary) hypertension: Secondary | ICD-10-CM

## 2024-03-21 ENCOUNTER — Telehealth: Payer: Self-pay | Admitting: Family Medicine

## 2024-03-21 NOTE — Telephone Encounter (Signed)
° °  Phone room this was done back in Feb 2025 and above is the confirmation they received the supply order.  Pt is more than welcome to call her DME company at [phone] for update.

## 2024-03-21 NOTE — Telephone Encounter (Signed)
 Pt called wanting to know if an Rx can be sent in for her Cpap supplies. Please advise.

## 2024-03-24 NOTE — Telephone Encounter (Signed)
 Pt called stating that she has new ins now and it does not cover [employer]. Pt is needing an order sent to a new DME so that she can order her cpap supplies. She states she has not had supplies in a month. Please advise.

## 2024-03-27 NOTE — Telephone Encounter (Signed)
 Sent a community message to [employer] with [employer].

## 2024-03-28 NOTE — Telephone Encounter (Signed)
" ° ° °  I check with Heather and patient can use [employer] for DME. Community message sent. "

## 2024-03-29 NOTE — Telephone Encounter (Signed)
 Left  message for patient to call2nd attempt left message for pt 03-20-2024

## 2024-04-03 NOTE — Telephone Encounter (Signed)
 Pt has returned the call to RMA from 12/24

## 2024-04-04 ENCOUNTER — Encounter: Payer: Self-pay | Admitting: Family Medicine

## 2024-04-04 NOTE — Telephone Encounter (Signed)
 Pt has called back stating her insurance company told her that [place], [place] has to call provider services at [phone] for the DME pt is allowed to use, please call

## 2024-04-04 NOTE — Telephone Encounter (Signed)
 Patient informed with the below note.  I asked pt to reach out to insurance company to figure out which DME company that accepts her insurance and let us  know so we can send the order.   Pt states she has been retired since 2024 and was getting supplies until Nov 2025.

## 2024-04-04 NOTE — Telephone Encounter (Signed)
 I called and spoke with Christine Eaton. Insurance rep, and  was given [employer] [phone].  My chart message sent to patient

## 2024-04-05 NOTE — Telephone Encounter (Signed)
 See telephone on 03/21/24

## 2024-04-11 NOTE — Telephone Encounter (Signed)
 Patient called on what to do to get CPAP supplies. Informed patient of previous note. She said will call [employer].

## 2024-04-11 NOTE — Telephone Encounter (Signed)
 Brianna from [facility] called to request  for Pt cpap supplies to  come though them .  [employer] is requesting  for PT new order for supplies , Demographics , Sleep study notes  and clinical noted  [employer] Number is  [phone] Fax: [phone]

## 2024-04-17 NOTE — Telephone Encounter (Signed)
 Faxed Supply Order, Demographics, Sleep Study Notes, and Clinic Notes to [facility] [phone]

## 2024-04-17 NOTE — Telephone Encounter (Signed)
 Correct Number [phone]

## 2024-05-03 NOTE — Telephone Encounter (Signed)
 Brianna from [facility] called to request Pt information be re faxed to office  Pt demographic , Clinical Notes  and order for CPAP supplies   Phone: [phone]  [phone]

## 2024-05-04 ENCOUNTER — Telehealth: Payer: Self-pay

## 2024-05-04 NOTE — Telephone Encounter (Signed)
 Re Faxed Demographics, Clinic Notes, and CPAP Supply Order to [facility] at [phone].

## 2024-05-23 ENCOUNTER — Telehealth: Payer: No Typology Code available for payment source | Admitting: Family Medicine

## 2024-05-31 ENCOUNTER — Encounter: Admitting: Internal Medicine
